# Patient Record
Sex: Male | Born: 1955 | Race: Black or African American | Hispanic: No | Marital: Single | Smoking: Current every day smoker
Health system: Southern US, Community
[De-identification: ages and names within clinical notes are randomized; demographics above are authoritative.]

## PROBLEM LIST (undated history)

## (undated) DIAGNOSIS — F419 Anxiety disorder, unspecified: Secondary | ICD-10-CM

## (undated) DIAGNOSIS — IMO0002 Reserved for concepts with insufficient information to code with codable children: Secondary | ICD-10-CM

## (undated) DIAGNOSIS — IMO0001 Reserved for inherently not codable concepts without codable children: Secondary | ICD-10-CM

## (undated) DIAGNOSIS — C61 Malignant neoplasm of prostate: Secondary | ICD-10-CM

## (undated) DIAGNOSIS — R232 Flushing: Secondary | ICD-10-CM

## (undated) DIAGNOSIS — E785 Hyperlipidemia, unspecified: Secondary | ICD-10-CM

## (undated) DIAGNOSIS — R972 Elevated prostate specific antigen [PSA]: Secondary | ICD-10-CM

## (undated) DIAGNOSIS — I10 Essential (primary) hypertension: Secondary | ICD-10-CM

## (undated) DIAGNOSIS — K5790 Diverticulosis of intestine, part unspecified, without perforation or abscess without bleeding: Secondary | ICD-10-CM

## (undated) DIAGNOSIS — C419 Malignant neoplasm of bone and articular cartilage, unspecified: Secondary | ICD-10-CM

## (undated) HISTORY — DX: Flushing: R23.2

## (undated) HISTORY — PX: TONSILLECTOMY: SUR1361

## (undated) HISTORY — DX: Reserved for concepts with insufficient information to code with codable children: IMO0002

## (undated) HISTORY — DX: Reserved for inherently not codable concepts without codable children: IMO0001

---

## 2010-03-12 DIAGNOSIS — C61 Malignant neoplasm of prostate: Secondary | ICD-10-CM

## 2010-03-12 HISTORY — DX: Malignant neoplasm of prostate: C61

## 2010-03-13 ENCOUNTER — Emergency Department (HOSPITAL_COMMUNITY)
Admission: EM | Admit: 2010-03-13 | Discharge: 2010-03-13 | Payer: Self-pay | Source: Home / Self Care | Admitting: Emergency Medicine

## 2010-04-06 ENCOUNTER — Observation Stay (HOSPITAL_COMMUNITY)
Admission: EM | Admit: 2010-04-06 | Discharge: 2010-04-07 | Payer: Self-pay | Source: Home / Self Care | Attending: Internal Medicine | Admitting: Internal Medicine

## 2010-04-06 LAB — DIFFERENTIAL
Basophils Absolute: 0.1 10*3/uL (ref 0.0–0.1)
Basophils Relative: 1 % (ref 0–1)
Eosinophils Absolute: 0.3 10*3/uL (ref 0.0–0.7)
Eosinophils Relative: 3 % (ref 0–5)
Monocytes Absolute: 0.9 10*3/uL (ref 0.1–1.0)

## 2010-04-06 LAB — CBC
HCT: 36.6 % — ABNORMAL LOW (ref 39.0–52.0)
MCHC: 32.8 g/dL (ref 30.0–36.0)
RDW: 13.9 % (ref 11.5–15.5)

## 2010-04-06 LAB — URINALYSIS, ROUTINE W REFLEX MICROSCOPIC
Leukocytes, UA: NEGATIVE
Urine Glucose, Fasting: NEGATIVE mg/dL
pH: 5.5 (ref 5.0–8.0)

## 2010-04-06 LAB — URINE MICROSCOPIC-ADD ON

## 2010-04-06 LAB — COMPREHENSIVE METABOLIC PANEL
ALT: 11 U/L (ref 0–53)
Calcium: 9.8 mg/dL (ref 8.4–10.5)
GFR calc Af Amer: >60 mL/min (ref >60)
Glucose, Bld: 107 mg/dL — ABNORMAL HIGH (ref 70–99)
Sodium: 139 mEq/L (ref 135–145)
Total Protein: 7.1 g/dL (ref 6.0–8.3)

## 2010-04-06 LAB — LIPASE, BLOOD: Lipase: 29 U/L (ref 11–59)

## 2010-04-07 LAB — URINE CULTURE
Colony Count: 25000
Culture  Setup Time: 201201261300

## 2010-04-07 LAB — PSA: PSA: 1432.58 ng/mL — ABNORMAL HIGH (ref <=4.00)

## 2010-04-07 LAB — CBC
HCT: 34.4 % — ABNORMAL LOW (ref 39.0–52.0)
Hemoglobin: 11.2 g/dL — ABNORMAL LOW (ref 13.0–17.0)
MCHC: 32.6 g/dL (ref 30.0–36.0)
WBC: 8.6 10*3/uL (ref 4.0–10.5)

## 2010-04-07 LAB — BASIC METABOLIC PANEL
CO2: 26 mEq/L (ref 19–32)
Glucose, Bld: 105 mg/dL — ABNORMAL HIGH (ref 70–99)
Potassium: 3.4 mEq/L — ABNORMAL LOW (ref 3.5–5.1)
Sodium: 140 mEq/L (ref 135–145)

## 2010-04-23 NOTE — Discharge Summary (Signed)
NAME:  Stephen Stokes, Stephen Stokes NO.:  [id number]  MEDICAL RECORD NO.:  [id number]          PATIENT TYPE:  OBV  LOCATION:  5531                         FACILITY:  [facility]  PHYSICIAN:  Cordelia Pen, NP  DATE OF BIRTH:  1955-08-18  DATE OF ADMISSION:  04/06/2010 DATE OF DISCHARGE:                              DISCHARGE SUMMARY   PRIMARY CARE PHYSICIAN:  None.  EXPECTED DATE OF DISPOSITION:  April 07, 2010  DISCHARGE DIAGNOSES: 1. Back pain in the setting of suspected metastatic prostate cancer. 2. Possible cystitis. 3. Hypokalemia, mild. 4. Anemia.  TESTS PERFORMED DURING HOSPITALIZATION:  CT of the abdomen and pelvis obtained April 06, 2010, showing retroperitoneal and pelvic lymphadenopathy with diffuse bony lesions suspicious for metastatic process.  PERTINENT LABORATORY:  White cell count 8.6, platelet count 142, hemoglobin 11.2, hematocrit 34.4, potassium 3.4.  Urine culture showing 25,000 colonies of multiple bacterial morphotypes, PSA is pending at time of dictation.  CONSULTATIONS OBTAINED DURING HOSPITALIZATION:  None.  BRIEF HISTORY:  [name] is a 55 year old African American male who presented to [facility] Emergency Department on day of admission with complaints of intractable back pain for several weeks.  CT of the abdomen and pelvis obtained at time of admission was concerning for metastatic process, being concerned that the patient will be lost for followup, he was not discharged from the emergency room.  The patient was admitted overnight for observation and pain control.  HOSPITAL COURSE: 1. Back pain in setting of suspected metastatic prostate cancer.     Again, the patient was admitted for observation status so that     followup care could be arranged.  The patient will need followup     with [facility] for possible cystoscopy with likely     subsequent referral to Oncology.  At this time, I am awaiting     callback from  [facility] to schedule outpatient appointment.     The patient is aware of need for close followup.  He verifies     understanding.  We will also discharge the patient on Lortab for     pain control.  The patient is instructed to use over-the-counter     stool softener for any constipation that can be a result of pain     medications.  PSA is pending at time of dictation. 2. Possible cystitis.  The patient did have an abnormal bladder     appearance on CT.  Uncertain, whether this is related to malignancy     versus a cystitis.  The patient did have some mild leukocytosis on     admission that is improved with empiric antibiotics.  We will     continue empiric Cipro for a total of 3 days treatment.  Urine     culture is unremarkable. 3. Anemia.  Suspect related to problem #1 to be monitored outpatient.  DISPOSITION:  The patient is felt medically stable for discharge home at this time.  Urology appointment 2/8 with Dr. Patsi Sears. See addendum for discharge medications.    Cordelia Pen, NP  Brendia Sacks, MD   LE/MEDQ  D:  04/07/2010  T:  04/07/2010  Job:  [id number]  Electronically Signed by Brendia Sacks  on 04/23/2010 03:52:58 PM

## 2010-04-23 NOTE — H&P (Signed)
NAME:  Stephen Stokes, Stephen Stokes NO.:  [id number]  MEDICAL RECORD NO.:  [id number]          PATIENT TYPE:  EMS  LOCATION:  MAJO                         FACILITY:  [facility]  PHYSICIAN:  Brendia Sacks, MD    DATE OF BIRTH:  1955-09-30  DATE OF ADMISSION:  04/06/2010 DATE OF DISCHARGE:                             HISTORY & PHYSICAL   PRIMARY CARE PHYSICIAN:  None.  The patient has no primary care physician.  REFERRING PHYSICIAN:  Carleene Cooper, MD  CHIEF COMPLAINT:  Back pain.  HISTORY OF PRESENT ILLNESS:  A 55 year old man presents to the Emergency Room with back pain.  He reports the pain has been going on for the last several weeks and seems to be getting worse.  He is aggravated by position and movement and relieved by lying down.  It can be up to a maximal intensity of 10/10.  He has noticed these low back pain, which is paravertebral in nature in the lumbar area has gone worse.  It is difficult for him to get up and down steps.  The pain was so severe last night, he came to the Emergency Room today.  He also had some pain in his side of the stomach as well as in the spine.  The patient has no primary care physician and has not seen physician in many years.  He takes no medications and has no known medical problems.  REVIEW OF SYSTEMS:  Negative for fever, weight loss, night sweats, chills, changes to his vision, sore throat, rash, muscle aches, and chest pain.  He did have shortness breath last night, but that resolved. No nausea, vomiting, abdominal pain, diarrhea, or bleeding.  He has had some dysuria.  PAST MEDICAL HISTORY:  None.  PAST SURGICAL HISTORY:  None.  SOCIAL HISTORY:  Smokes 4-5 cigarettes per day.  Drinks about 4 beers per week.  Denies drugs.  Works part-time as a Copy.  Lives by himself.  FAMILY HISTORY:  Negative for first-degree coronary artery disease.  ALLERGIES:  None.  MEDICATIONS:  He has recently been on Motrin for his back  pain.  PHYSICAL EXAMINATION:  VITAL SIGNS:  Blood pressure 167/98, pulse 96, respirations 18, temperature 98.3, and sat 97%. GENERAL:  Well-developed, well-nourished man in no acute distress. HEENT:  Head and appears to be normal.  Eyes, sclerae are clear.  Pupils are equal, round, and reactive to light.  Lids, lashes, and conjunctivae appear normal.  ENT:  Hearing is grossly normal.  Lips, gums, and tongue appear unremarkable.  Poor dentition with obvious caries. NECK:  Supple.  No lymphadenopathy or masses.  No thyromegaly. CHEST:  Clear to auscultation bilaterally.  No wheezes, rales, or rhonchi.  There is normal respiratory effort.  No lower extremity edema. ABDOMEN:  Soft, nontender and nondistended.  No masses are appreciated. SKIN:  Normal without rash or indurations.  Nontender to palpation. Tone and strength in the upper and lower extremities appears to be grossly normal. PSYCHIATRIC:  Grossly normal affect and mood.  Speech is fluent and appropriate.  He is a fair historian. BACK:  No gross abnormalities.  He has  no pain with palpation or left CVA tenderness, although he does locate his pain in the paravertebral lumbar area. GENITOURINARY:  The anus does appear to be normal.  He does have an external hemorrhoid.  Digital rectal exam is notable for nodular, firm, asymmetric prostate.  PERTINENT LABORATORY STUDIES: 1. CBC is notable for mild leukocytosis of 10.9 and hemoglobin of     12.0. 2. Basic metabolic panel notable is essentially unremarkable.  Hepatic     panel is notable for bilirubin of 1.5 and alkaline phosphatase of     789. 3. Lipase is negative. 4. Urinalysis is equivocal.  Urine cultures pending.  IMAGING DATA:  CT of the abdomen and pelvis on April 06, 2010: Retroperitoneal and pelvic lymphadenopathy and diffuse sclerotic bony lesions highly suspicious for metastatic neoplasm or lymphoma. Correlate with PSA and recommend further evaluation.  Question  mild bladder wall thickening and perivesicular inflammation.  The size of mass is not excluded.  Colonic diverticulosis without diverticulitis.  ASSESSMENT/PLAN:  This is a 55 year old man who presents to the Emergency Room with back pain.  1. Back pain.  Secondary to the diagnoses as listed below.  We will     admit for overnight observation both for pain control and     coordination of further evaluation and followup.  See orders. 2. Suspected metastatic prostate cancer.  Exam is abnormal.  Based on     his exam, as well as the CT findings and elevated alkaline     phosphatase, I suspect metastatic prostate cancer.  We will check     PSA.  The patient will certainly need further evaluation both from     urologic as well as potentially on Oncology standpoint.  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  We will therefore admit for overnight     observation and coordination of his care as well as pain control.     We will discuss this further evaluation with Urology and Oncology.     If the pain is stable, he may be able to be discharged in the     morning. 3. Abnormal bladder.  We will treat for cystitis, although his     urinalysis is unimpressive.  He may have a primary bladder     malignancy.  He will need further outpatient evaluation. 4. Normocytic anemia.  We will recheck his CBC in the morning.  If     elevated blood pressure, likely secondary to pain although he     certainly may have undiagnosed hypertension.  We will continue to     follow this.     Brendia Sacks, MD    DG/MEDQ  D:  04/06/2010  T:  04/06/2010  Job:  [id number]  Electronically Signed by Brendia Sacks  on 04/23/2010 03:53:05 PM

## 2010-05-17 ENCOUNTER — Other Ambulatory Visit (HOSPITAL_COMMUNITY): Payer: Self-pay | Admitting: Urology

## 2010-05-17 DIAGNOSIS — C61 Malignant neoplasm of prostate: Secondary | ICD-10-CM

## 2010-05-18 NOTE — Discharge Summary (Signed)
  NAME:  Stephen Stokes, Stephen Stokes NO.:  [id number]  MEDICAL RECORD NO.:  [id number]          PATIENT TYPE:  OBV  LOCATION:  5531                         FACILITY:  [facility]  PHYSICIAN:  Brendia Sacks, MD    DATE OF BIRTH:  [birth year]  DATE OF ADMISSION:  04/06/2010 DATE OF DISCHARGE:                              DISCHARGE SUMMARY   ADDENDUM  MEDICATIONS AT THE TIME OF DISCHARGE: 1. Cipro 750 mg p.o. b.i.d. x2 more days. 2. Hydrocodone/APAP 5/325 one-two tablets p.o. q.4 h p.r.n. pain.     Cordelia Pen, NP   ______________________________ Brendia Sacks, MD    LE/MEDQ  D:  04/07/2010  T:  04/07/2010  Job:  [id number]  Electronically Signed by Cordelia Pen NP on 04/28/2010 11:58:52 AM Electronically Signed by Brendia Sacks  on 05/17/2010 12:58:40 PM

## 2010-05-23 ENCOUNTER — Ambulatory Visit (HOSPITAL_COMMUNITY): Payer: Self-pay

## 2010-05-23 ENCOUNTER — Ambulatory Visit (HOSPITAL_COMMUNITY)
Admission: RE | Admit: 2010-05-23 | Discharge: 2010-05-23 | Disposition: A | Discharge status: Home / Self Care | Payer: Medicaid Other | Source: Ambulatory Visit | Attending: Urology | Admitting: Urology

## 2010-05-23 ENCOUNTER — Encounter (HOSPITAL_COMMUNITY)
Admission: RE | Admit: 2010-05-23 | Discharge: 2010-05-23 | Disposition: A | Discharge status: Home / Self Care | Payer: Medicaid Other | Source: Ambulatory Visit | Attending: Urology | Admitting: Urology

## 2010-05-23 DIAGNOSIS — C7952 Secondary malignant neoplasm of bone marrow: Secondary | ICD-10-CM | POA: Insufficient documentation

## 2010-05-23 DIAGNOSIS — N1330 Unspecified hydronephrosis: Secondary | ICD-10-CM | POA: Insufficient documentation

## 2010-05-23 DIAGNOSIS — M25559 Pain in unspecified hip: Secondary | ICD-10-CM | POA: Insufficient documentation

## 2010-05-23 DIAGNOSIS — C7951 Secondary malignant neoplasm of bone: Secondary | ICD-10-CM | POA: Insufficient documentation

## 2010-05-23 DIAGNOSIS — K5730 Diverticulosis of large intestine without perforation or abscess without bleeding: Secondary | ICD-10-CM | POA: Insufficient documentation

## 2010-05-23 DIAGNOSIS — I517 Cardiomegaly: Secondary | ICD-10-CM | POA: Insufficient documentation

## 2010-05-23 DIAGNOSIS — C61 Malignant neoplasm of prostate: Secondary | ICD-10-CM

## 2010-05-23 DIAGNOSIS — R599 Enlarged lymph nodes, unspecified: Secondary | ICD-10-CM | POA: Insufficient documentation

## 2010-05-23 MED ORDER — IOHEXOL 300 MG/ML  SOLN
100.0000 mL | Freq: Once | INTRAMUSCULAR | Status: AC | PRN
  Administered 2010-05-23: 100 mL via INTRAVENOUS

## 2010-05-23 MED ORDER — TECHNETIUM TC 99M MEDRONATE IV KIT
25.0000 | PACK | Freq: Once | INTRAVENOUS | Status: AC | PRN
  Administered 2010-05-23: 23.4 via INTRAVENOUS

## 2010-05-31 ENCOUNTER — Other Ambulatory Visit (HOSPITAL_COMMUNITY): Payer: Self-pay | Admitting: Urology

## 2010-06-07 ENCOUNTER — Ambulatory Visit (HOSPITAL_COMMUNITY)
Admission: RE | Admit: 2010-06-07 | Discharge: 2010-06-07 | Disposition: A | Discharge status: Home / Self Care | Payer: Medicaid Other | Source: Ambulatory Visit | Attending: Urology | Admitting: Urology

## 2010-06-07 DIAGNOSIS — Z13820 Encounter for screening for osteoporosis: Secondary | ICD-10-CM | POA: Insufficient documentation

## 2010-06-08 ENCOUNTER — Encounter (HOSPITAL_COMMUNITY): Payer: Medicaid Other

## 2010-06-08 ENCOUNTER — Ambulatory Visit (HOSPITAL_COMMUNITY)
Admission: RE | Admit: 2010-06-08 | Discharge: 2010-06-08 | Disposition: A | Discharge status: Home / Self Care | Payer: Medicaid Other | Source: Ambulatory Visit | Attending: Urology | Admitting: Urology

## 2010-06-08 ENCOUNTER — Other Ambulatory Visit: Payer: Self-pay | Admitting: Urology

## 2010-06-08 ENCOUNTER — Other Ambulatory Visit (HOSPITAL_COMMUNITY): Payer: Self-pay | Admitting: Urology

## 2010-06-08 DIAGNOSIS — C61 Malignant neoplasm of prostate: Secondary | ICD-10-CM | POA: Insufficient documentation

## 2010-06-08 DIAGNOSIS — Z01810 Encounter for preprocedural cardiovascular examination: Secondary | ICD-10-CM | POA: Insufficient documentation

## 2010-06-08 DIAGNOSIS — Z01818 Encounter for other preprocedural examination: Secondary | ICD-10-CM | POA: Insufficient documentation

## 2010-06-08 DIAGNOSIS — C801 Malignant (primary) neoplasm, unspecified: Secondary | ICD-10-CM

## 2010-06-08 DIAGNOSIS — I517 Cardiomegaly: Secondary | ICD-10-CM | POA: Insufficient documentation

## 2010-06-08 DIAGNOSIS — C7951 Secondary malignant neoplasm of bone: Secondary | ICD-10-CM | POA: Insufficient documentation

## 2010-06-08 DIAGNOSIS — C7952 Secondary malignant neoplasm of bone marrow: Secondary | ICD-10-CM | POA: Insufficient documentation

## 2010-06-08 DIAGNOSIS — Z01812 Encounter for preprocedural laboratory examination: Secondary | ICD-10-CM | POA: Insufficient documentation

## 2010-06-08 LAB — DIFFERENTIAL
Basophils Absolute: 0 10*3/uL (ref 0.0–0.1)
Basophils Relative: 1 % (ref 0–1)
Neutro Abs: 3.8 10*3/uL (ref 1.7–7.7)
Neutrophils Relative %: 57 % (ref 43–77)

## 2010-06-08 LAB — BASIC METABOLIC PANEL
BUN: 16 mg/dL (ref 6–23)
Calcium: 9 mg/dL (ref 8.4–10.5)
GFR calc non Af Amer: >60 mL/min (ref >60)
Potassium: 3.5 mEq/L (ref 3.5–5.1)
Sodium: 144 mEq/L (ref 135–145)

## 2010-06-08 LAB — CBC
MCHC: 31.2 g/dL (ref 30.0–36.0)
RDW: 17.8 % — ABNORMAL HIGH (ref 11.5–15.5)

## 2010-06-08 LAB — SURGICAL PCR SCREEN: Staphylococcus aureus: NEGATIVE

## 2010-06-11 HISTORY — PX: ORCHIECTOMY: SHX2116

## 2010-06-11 HISTORY — PX: TRANSURETHRAL RESECTION OF PROSTATE: SHX73

## 2010-06-12 ENCOUNTER — Ambulatory Visit (HOSPITAL_COMMUNITY)
Admission: RE | Admit: 2010-06-12 | Discharge: 2010-06-14 | DRG: 711 | Disposition: A | Discharge status: Home / Self Care | Payer: Medicaid Other | Source: Ambulatory Visit | Attending: Urology | Admitting: Urology

## 2010-06-12 ENCOUNTER — Other Ambulatory Visit: Payer: Self-pay | Admitting: Urology

## 2010-06-12 DIAGNOSIS — Z01812 Encounter for preprocedural laboratory examination: Secondary | ICD-10-CM

## 2010-06-12 DIAGNOSIS — C775 Secondary and unspecified malignant neoplasm of intrapelvic lymph nodes: Secondary | ICD-10-CM | POA: Diagnosis present

## 2010-06-12 DIAGNOSIS — Y836 Removal of other organ (partial) (total) as the cause of abnormal reaction of the patient, or of later complication, without mention of misadventure at the time of the procedure: Secondary | ICD-10-CM | POA: Diagnosis not present

## 2010-06-12 DIAGNOSIS — Y9210 Unspecified residential institution as the place of occurrence of the external cause: Secondary | ICD-10-CM | POA: Diagnosis not present

## 2010-06-12 DIAGNOSIS — N320 Bladder-neck obstruction: Secondary | ICD-10-CM | POA: Diagnosis present

## 2010-06-12 DIAGNOSIS — IMO0002 Reserved for concepts with insufficient information to code with codable children: Secondary | ICD-10-CM | POA: Diagnosis not present

## 2010-06-12 DIAGNOSIS — D649 Anemia, unspecified: Secondary | ICD-10-CM | POA: Diagnosis present

## 2010-06-12 DIAGNOSIS — Z79899 Other long term (current) drug therapy: Secondary | ICD-10-CM

## 2010-06-12 DIAGNOSIS — Z01818 Encounter for other preprocedural examination: Secondary | ICD-10-CM

## 2010-06-12 DIAGNOSIS — C61 Malignant neoplasm of prostate: Secondary | ICD-10-CM | POA: Diagnosis present

## 2010-06-12 DIAGNOSIS — F17200 Nicotine dependence, unspecified, uncomplicated: Secondary | ICD-10-CM | POA: Diagnosis present

## 2010-06-12 DIAGNOSIS — I10 Essential (primary) hypertension: Secondary | ICD-10-CM | POA: Diagnosis present

## 2010-06-12 DIAGNOSIS — C7952 Secondary malignant neoplasm of bone marrow: Secondary | ICD-10-CM | POA: Diagnosis present

## 2010-06-12 DIAGNOSIS — C7951 Secondary malignant neoplasm of bone: Secondary | ICD-10-CM | POA: Diagnosis present

## 2010-06-13 LAB — HEMOGLOBIN AND HEMATOCRIT, BLOOD
HCT: 21.9 % — ABNORMAL LOW (ref 39.0–52.0)
HCT: 26.7 % — ABNORMAL LOW (ref 39.0–52.0)
Hemoglobin: 6.6 g/dL — CL (ref 13.0–17.0)

## 2010-06-13 LAB — PREPARE RBC (CROSSMATCH)

## 2010-06-14 LAB — TYPE AND SCREEN
ABO/RH(D): A POS
Antibody Screen: NEGATIVE
Unit division: 0

## 2010-06-23 NOTE — H&P (Signed)
  NAME:  Stephen Stokes, Stephen Stokes               ACCOUNT NO.:  [id number]  MEDICAL RECORD NO.:  [id number]           PATIENT TYPE:  I  LOCATION:  0012                         FACILITY:  [id number]  PHYSICIAN:  Flynt Breeze I. Patsi Sears, M.D.DATE OF BIRTH:  [birth year]  DATE OF ADMISSION:  06/12/2010 DATE OF DISCHARGE:                             HISTORY & PHYSICAL   Stephen Stokes is a 55 year old single male, seen in [facility] Emergency Room with left flank pain, and rectal examination showing bilateral hard prostate.  Prostate biopsy was accomplished, and showed a 45 g prostate, with Gleason 7, Gleason 8, Gleason 9 prostate cancer in 80-95% of each of 12 biopsies.  He has unremitting thoracic back pain, and Percocet was changed for MS Contin and MSIR.  The patient has PSA of 1237.67.  He took Casodex 50 mg 1 p.o. per day prior to surgical intervention.  His past medical history is otherwise noncontributory.  Tobacco is none. Alcohol none.  MEDICATIONS: 1. Casodex 15 mg 1 p.o. per day. 2. Hypertensive medication (unknown). 3. Hydrocodone acetaminophen for pain - switched to MSIR and MS     Contin.  FAMILY HISTORY:  Father died age 36 of boating accident.  Mother died (unknown age).  SOCIAL HISTORY:  The patient drinks approximately 1 alcohol drink per day and 1 caffeine drink per day.  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  Tobacco is 1 pack per day for 25 years.  PHYSICAL EXAM:  GENERAL:  A well-developed, well-nourished [place] American male, in no acute distress. VITAL SIGNS:  Blood pressure 170/110, temperature 98.6, heart rate is 97. NECK:  Supple, nontender. CHEST:  Clear to P and A. ABDOMEN:  Soft.  Positive bowel sounds without organomegaly or masses. GENITOURINARY:  Shows normal male external genitalia.  Rectal examination shows 3+ rock hard prostate, nonnodular, nonmobile. RECTAL:  Otherwise shows normal sphincter tone. EXTREMITIES:  No cyanosis and no  edema.  ADMITTING IMPRESSION:  Metastatic adenocarcinoma of prostate.  PLAN:  Cystourethroscopy, Gyrus TURP, and bilateral scrotal orchiectomy.     Sehaj Mcenroe I. Patsi Sears, M.D.     SIT/MEDQ  D:  06/12/2010  T:  06/12/2010  Job:  [id number]  Electronically Signed by Jethro Bolus M.D. on 06/23/2010 05:49:39 PM

## 2010-06-23 NOTE — Discharge Summary (Signed)
NAME:  Stephen Stokes, Stephen Stokes               ACCOUNT NO.:  [id number]  MEDICAL RECORD NO.:  [id number]           PATIENT TYPE:  I  LOCATION:  1435                         FACILITY:  [facility]  PHYSICIAN:  Kore Madlock I. Patsi Sears, M.D.DATE OF BIRTH:  11/17/1955  DATE OF ADMISSION:  06/12/2010 DATE OF DISCHARGE:  06/14/2010                              DISCHARGE SUMMARY   FINAL DIAGNOSIS:  Metastatic adenocarcinoma of the prostate.  OPERATIONS:  This admission took place on April 02, 1. TURP and bilateral scrotal orchiectomy. 2. Cystoscopy, clot evacuation, cauterization of the prostate.  HISTORY:  Stephen Stokes is a 55 year old single male, originally seen at [facility] Emergency Room with left flank pain, with rectal examination showing bilateral heart prostate.  Biopsy showed Gleason 7, Gleason 8, and Gleason 9 prostate cancer and all biopsies, and an 80% to 95% of each biopsy.  He has had unremitting back pain, treated now with MSContin and MSIR.  His PSA was 1237.67.  The patient was pretreated with Casodex 50 mg 1 p.o. per day prior to surgical intervention.  He has a positive bone scan and positive CT scan for metastatic disease.  PAST MEDICAL HISTORY:  His past history has been otherwise noncontributory.  Tobacco is none currently.  MEDICATIONS: 1. Casodex 50 mg one per day. 2. Hypertension medication (unknown). 3. MSIR. 4. MS Contin.  FAMILY HISTORY:  Father died at age 53.  Mother died, unknown age.  SOCIAL HISTORY:  Minimal alcohol, minimal caffeine per day.  Tobacco, past history of 25 pack-year history.  He is currently single and employed as a Copy.  ADMISSION PHYSICAL EXAMINATION:  As noted in H and P of April 2nd.  LABORATORY DATA:  On admission, hemoglobin is 12.0 with white blood cell count 10,900, platelet count 150,000.  Hemoglobin on March 29th was 8.9, but drop to 6.6 postoperatively, rose to 8.4 after transfusion.  IMAGING:  CT scan result shows mild right  hydronephrosis, with delayed excretion of contrast material from the right kidney.  Likely cause for obstruction is invasion of the bladder base by the prostate gland at the ureterovesical junction.  There is retroperitoneal lymphadenopathy noted.  Enlarged left and right external iliac nodes were noted.  Bone scan shows diffuse osseous metastatic disease, involving the ribs, spine, pelvis.  HOSPITAL COURSE:  On the day of admission, the patient underwent TURP, and bilateral scrotal orchiectomy.  Postoperatively, the patient developed hematuria with clot formation, which could not be irrigated on the floor.  The patient was taken back to the operating room for cystoscopy and clot evacuation, fulguration of prostatic fossa.  He did well postoperatively, and had transfusion, and following this, the Foley catheter was removed, the patient is allowed to be discharged.  He has a followup date in the office.  His MS Contin is changed back to Percocet, because of decreased pain in his back.  (? orchiectomy effect).  The patient will be followed in the office, and then eventually referred to Dr. Clelia Croft for followup.  He is discharged in improved condition.     Stephen Stokes I. Patsi Sears, M.D.     SIT/MEDQ  D:  06/14/2010  T:  06/15/2010  Job:  [id number]  Electronically Signed by Jethro Bolus M.D. on 06/23/2010 05:49:33 PM

## 2010-06-23 NOTE — Op Note (Signed)
NAME:  Stephen Stokes, BASU               ACCOUNT NO.:  [id number]  MEDICAL RECORD NO.:  [id number]           PATIENT TYPE:  I  LOCATION:  0012                         FACILITY:  [facility]  PHYSICIAN:  Lathyn Griggs I. Amoy Steeves, M.D.DATE OF BIRTH:  [birth year]  DATE OF PROCEDURE:  06/12/2010 DATE OF DISCHARGE:                              OPERATIVE REPORT   PREOPERATIVE DIAGNOSIS:  Adenocarcinoma of the prostate with obstruction, metastatic disease.  POSTOPERATIVE DIAGNOSIS:  Adenocarcinoma of the prostate with obstruction, metastatic disease.  OPERATIONS:  Cystourethroscopy, Gyrus transurethral resection of prostate, bilateral scrotal orchiectomy.  SURGEON:  Tino Ronan I. Patsi Sears, M.D.  ANESTHESIA:  General LMA.  PREPARATION:  After appropriate preanesthesia, the patient was brought to the operating room, placed on the operating room table in a dorsal supine position where general LMA anesthesia was induced.  He was then replaced in the dorsal lithotomy position where the pubis was prepped with Betadine solution and draped in usual fashion.  REVIEW OF HISTORY:  Mr. Stephen Stokes is a 22 year old single male, presenting to [facility] Emergency Room with left flank pain and one episode of rectal bleeding which was self limited.  The patient was found to have a normal hard prostate on rectal examination and a PSA of 1237.67. Prostate was 45 g and biopsy showed Gleason 7, Gleason 8, and Gleason 9 prostate cancer in 80% to 95% of each 12 biopsies.  He had unremitting thoracic back pain with bone scan showing multiple areas positive for metastatic disease and CT scan showing massive metastatic soft tissue disease in the lymph nodes.  The patient was started on hydromorphone, morphine sulfate, and  because she has significant outlet obstruction with apparent cancer growing up under the trigone, he is now for TURP and bilateral scrotal orchiectomy.  DESCRIPTION OF PROCEDURE:  Cystourethroscopy was  accomplished and shows severe obstruction of the bladder by thickened whitish prostate, presumed cancer of the prostate.  This process is throughout the prostate and also throughout the bladder neck and the bladder base up the level of the trigone.  I could not identify the orifices, however.  Resection was accomplished then from the 7 o'clock to the 5 o'clock position and from the 10 o'clock to the 7 o'clock position and from 2 o'clock to the 5 o'clock position..  Subtotal prostatectomy was accomplished.  Because it is felt that the patient needs to retain his urinary continence as well as possible and to try to open him up as well as possible.  However, I believe the cancer has grown underneath the bladder base and into the trigone area and will possibly cause ureteral obstruction.  Bleeding was stopped with electrosurgical unit, and following this, a 24- [place] three-way Foley catheter was placed to traction but no irrigation was necessary.  Following this, the scrotum was reprepped with Betadine solution and draped in usual fashion.  A midline scrotal incision was made.  Subcutaneous tissue dissected with electrosurgical unit.  Following this, the left hemiscrotum was entered with electrosurgical unit and the testicle identified.  The vas was isolated, dissected, ligated and incised.  It was ligated with a 0 Vicryl suture.  Following that, the remaining portion of the spermatic cord was divided, doubly clamped, and amputated.  Ligation was accomplished with 0 Vicryl suture.  No bleeding was noted.  The right hemiscrotum was entered and subcutaneous tissue dissected. The tunica vaginalis was entered and the vas was identified and dissected and ligated with 0 Vicryl suture.  Following this, the spermatic cord was then dissected, split in half and doubly clamped. The testicle was amputated and each end was ligated with 0 Vicryl suture.  Marcaine 0.25 plain was injected in the  stump of the spermatic cord bilaterally and into the wound.  The wound was closed with a running 3-0 Vicryl suture in each of the hemiscrotal dissections and in the subcutaneous tissue.  The skin was closed with running 4-0 Vicryl.  Following this, the patient was awakened and taken to recovery room in good condition.     Lilliane Sposito I. Patsi Sears, M.D.     SIT/MEDQ  D:  06/12/2010  T:  06/12/2010  Job:  [id number]  cc:   Melvern Banker Fax: [phone]  Electronically Signed by Stephen Stokes M.D. on 06/23/2010 05:49:36 PM

## 2010-06-29 NOTE — Op Note (Signed)
  NAME:  Stephen Stokes, Stephen Stokes               ACCOUNT NO.:  [id number]  MEDICAL RECORD NO.:  [id number]           PATIENT TYPE:  I  LOCATION:  1435                         FACILITY[facility]  [facility]  PHYSICIAN:  Heloise Purpura, MD      DATE OF BIRTH:  1956/01/15  DATE OF PROCEDURE:  06/12/2010 DATE OF DISCHARGE:                              OPERATIVE REPORT   PREOPERATIVE DIAGNOSES: 1. Hematuria 2. Clot retention.  POSTOPERATIVE DIAGNOSES: 1. Hematuria 2. Clot retention.  PROCEDURES: 1. Cystoscopy. 2. Clot evacuation. 3. Fulguration of prostatic bleeding site.  SURGEON:  Heloise Purpura, MD  ANESTHESIA:  General.  COMPLICATIONS:  None.  ESTIMATED BLOOD LOSS:  Minimal.  INDICATION:  [name] is a 55 year old gentleman who is status post a transurethral resection of the prostate earlier today by Dr. Jethro Bolus.  I was called to his bedside due to the fact he had no urine output since arriving to the floor and his catheter was unable to be irrigated by the nursing staff.  Multiple attempts were made to irrigate the patient's catheter along with placement of a 24-French hematuria catheter.  He continued to have significant obstruction of his catheter and it was recommended that he proceed to the operating room for urgent cystoscopy and clot evacuation.  The potential risks, complications, and alternative options were discussed with the patient and informed consent was obtained.  DESCRIPTION OF PROCEDURE:  The patient was taken to the operating room and a general anesthetic was administered.  He was given preoperative antibiotics, placed in the dorsal lithotomy position, and prepped and draped in the usual sterile fashion.  Next, a preoperative time-out was performed.  Cystourethroscopy was performed after placement of a 26- French resectoscope sheath.  This revealed multiple large formed clot within the bladder which was evacuated with a Toomey syringe.  Once this clot was removed,  cystoscopy was again performed with no further clot seen within the bladder.  There was noted to be a prostatic bleeding site near the bladder neck which was able to be cauterized with a cautery loop.  This appeared to adequately control the bleeding and a 24- [place] 3-way hematuria catheter was then placed with the balloon inflated with 30 cc of sterile water.  The patient was then placed on traction with his catheter and normal saline continuous bladder irrigation was instituted.  He tolerated the procedure well and without complications.  He was able to be extubated and transferred to the recovery unit in satisfactory condition.     Heloise Purpura, MD     LB/MEDQ  D:  06/12/2010  T:  06/13/2010  Job:  [id number]  cc:   Lynelle Smoke I. Patsi Sears, M.D. Fax: [phone]  Electronically Signed by Heloise Purpura MD on 06/29/2010 06:03:44 AM

## 2011-06-05 ENCOUNTER — Other Ambulatory Visit: Payer: Self-pay | Admitting: Family Medicine

## 2011-06-05 ENCOUNTER — Ambulatory Visit
Admission: RE | Admit: 2011-06-05 | Discharge: 2011-06-05 | Disposition: A | Discharge status: Home / Self Care | Payer: Medicaid Other | Source: Ambulatory Visit | Attending: Family Medicine | Admitting: Family Medicine

## 2011-06-05 DIAGNOSIS — I10 Essential (primary) hypertension: Secondary | ICD-10-CM

## 2011-06-14 ENCOUNTER — Ambulatory Visit (HOSPITAL_COMMUNITY)
Admission: RE | Admit: 2011-06-14 | Discharge: 2011-06-14 | Disposition: A | Discharge status: Home / Self Care | Payer: Medicaid Other | Source: Ambulatory Visit | Attending: Family Medicine | Admitting: Family Medicine

## 2011-06-14 ENCOUNTER — Other Ambulatory Visit: Payer: Self-pay

## 2011-06-14 DIAGNOSIS — R000 Tachycardia, unspecified: Secondary | ICD-10-CM | POA: Insufficient documentation

## 2011-06-14 LAB — LIPID PANEL
Cholesterol: 269 mg/dL — ABNORMAL HIGH (ref 0–200)
HDL: 42 mg/dL (ref >39)
Total CHOL/HDL Ratio: 6.4 RATIO
Triglycerides: 132 mg/dL (ref <150)
VLDL: 26 mg/dL (ref 0–40)

## 2011-06-14 LAB — GLUCOSE, RANDOM: Glucose, Bld: 124 mg/dL — ABNORMAL HIGH (ref 70–99)

## 2011-12-16 ENCOUNTER — Encounter (HOSPITAL_COMMUNITY): Payer: Self-pay | Admitting: *Deleted

## 2011-12-16 ENCOUNTER — Emergency Department (HOSPITAL_COMMUNITY): Payer: Self-pay

## 2011-12-16 ENCOUNTER — Emergency Department (HOSPITAL_COMMUNITY)
Admission: EM | Admit: 2011-12-16 | Discharge: 2011-12-16 | Disposition: A | Discharge status: Home / Self Care | Payer: Self-pay | Attending: Emergency Medicine | Admitting: Emergency Medicine

## 2011-12-16 DIAGNOSIS — Z859 Personal history of malignant neoplasm, unspecified: Secondary | ICD-10-CM | POA: Insufficient documentation

## 2011-12-16 DIAGNOSIS — C61 Malignant neoplasm of prostate: Secondary | ICD-10-CM

## 2011-12-16 DIAGNOSIS — M79609 Pain in unspecified limb: Secondary | ICD-10-CM | POA: Insufficient documentation

## 2011-12-16 DIAGNOSIS — M5416 Radiculopathy, lumbar region: Secondary | ICD-10-CM

## 2011-12-16 DIAGNOSIS — I10 Essential (primary) hypertension: Secondary | ICD-10-CM | POA: Insufficient documentation

## 2011-12-16 DIAGNOSIS — M549 Dorsalgia, unspecified: Secondary | ICD-10-CM | POA: Insufficient documentation

## 2011-12-16 HISTORY — DX: Essential (primary) hypertension: I10

## 2011-12-16 MED ORDER — PREDNISONE 20 MG PO TABS: ORAL_TABLET | ORAL | Status: DC

## 2011-12-16 MED ORDER — OXYCODONE-ACETAMINOPHEN 5-325 MG PO TABS: 2.0000 | ORAL_TABLET | Freq: Four times a day (QID) | ORAL | Status: DC | PRN

## 2011-12-16 NOTE — ED Provider Notes (Signed)
History  This chart was scribed for Hurman Horn, MD by Erskine Emery. This patient was seen in room [facility] and the patient's care was started at 12:00.   CSN: [id number]  Arrival date & time 12/16/11  1028   None     Chief Complaint  Patient presents with  . Back Pain  . Leg Pain    (Consider location/radiation/quality/duration/timing/severity/associated sxs/prior treatment) The history is provided by the patient. No language interpreter was used.  Stephen Stokes is a 56 y.o. male who presents to the Emergency Department complaining of constant pain in the mid back that radiates down the right leg all the way down to the foot for the past couple days. Pt reports the pain is worse in the morning when he is stiff and at that time it is aggravated by movement. Pt reports some associated weakness and numbness in the right leg and mild SOB but denies any associated bowel or bladder incontinence, rash, fever, abdominal pain, chest pain, or h/o IV drug abuse. Pt has a h/o prostate and bone cancer with associated chronic pain. Pt was taking shots x1/month for the pain but ceased doing so 2 months ago. Pt reports it has been about a year since he had back pain. Pt started taking pain pills (Ibuprofen) yesterday but has not been taking anything else for pain for the last several months.  Pt is followed by a urologist for his prostate cancer. Dr. Bruna Potter is the pt's PCP.  Past Medical History  Diagnosis Date  . Cancer   . Hypertension     History reviewed. No pertinent past surgical history.  History reviewed. No pertinent family history.  History  Substance Use Topics  . Smoking status: Not on file  . Smokeless tobacco: Not on file  . Alcohol Use:       Review of Systems 10 Systems reviewed and are negative for acute change except as noted in the HPI.   Allergies  Review of patient's allergies indicates no known allergies.  Home Medications   Current Outpatient Rx  Name  Route Sig Dispense Refill  . ACETAMINOPHEN 500 MG PO TABS Oral Take 1,000 mg by mouth every 2 (two) hours as needed. pain    . OXYCODONE-ACETAMINOPHEN 5-325 MG PO TABS Oral Take 2 tablets by mouth every 6 (six) hours as needed for pain. 20 tablet 0  . PREDNISONE 20 MG PO TABS  3 tabs po day one, then 2 tabs daily x 4 days 11 tablet 0    Triage Vitals: BP 177/109  Pulse 110  Temp 98.7 F (37.1 C) (Oral)  Resp 16  SpO2 95%  Physical Exam  Nursing note and vitals reviewed. Constitutional:       Awake, alert, nontoxic appearance with baseline speech.  HENT:  Head: Atraumatic.  Eyes: Pupils are equal, round, and reactive to light. Right eye exhibits no discharge. Left eye exhibits no discharge.  Neck: Neck supple.  Cardiovascular: Normal rate, regular rhythm and normal heart sounds.   No murmur heard. Pulmonary/Chest: Effort normal and breath sounds normal. No respiratory distress. He has no wheezes. He has no rales. He exhibits no tenderness.  Abdominal: Soft. Bowel sounds are normal. He exhibits no mass. There is no tenderness. There is no rebound.  Musculoskeletal:       Thoracic back: He exhibits no tenderness.       Lumbar back: He exhibits no tenderness.       Diffuse lumbar and paralumbar tenderness. Right lateral  thigh and right lateral lower leg have decreased light touch. Feet have normal light touch. Pt walks with pain.  Bilateral lower extremities non tender without new rashes or color change, baseline ROM with intact DP pulses, CR<2 secs all digits bilaterally, DTR's symmetric and intact bilaterally KJ / AJ, motor symmetric bilateral 5 / 5 hip flexion, quadriceps, hamstrings, EHL, foot dorsiflexion, foot plantarflexion, gait somewhat antalgic but without apparent new ataxia.  Neurological:       Mental status baseline for patient.  Upper extremity motor strength and sensation intact and symmetric bilaterally.  Skin: No rash noted.  Psychiatric: He has a normal mood and  affect.    ED Course  Procedures (including critical care time) DIAGNOSTIC STUDIES: Oxygen Saturation is 95% on room air, adequate by my interpretation.    COORDINATION OF CARE: 12:05--Patient / Family / Caregiver informed of clinical course, understand medical decision-making process, and agree with plan.  13:24--I rechecked the pt and explained the results of his radiology.   Dg Lumbar Spine Complete  12/16/2011  *RADIOLOGY REPORT*  Clinical Data: Right thigh numbness with low back pain.  Prostate cancer.  LUMBAR SPINE - COMPLETE 4+ VIEW  Comparison: Whole body bone scan 05/23/2010.  Abdominal CT 05/23/2010.  Findings: There are five lumbar type vertebral bodies.  The alignment is normal.  There are diffuse blastic metastases throughout the lumbar spine and pelvis.  No pathologic fracture is identified.  There is no evidence of pars defect.  Facet degenerative changes are present inferiorly.  IMPRESSION: Diffuse osseous metastatic disease appears unchanged from available prior studies.  No acute fracture or malalignment identified.   Original Report Authenticated By: Gerrianne Scale, M.D.        MDM  I personally performed the services described in this documentation, which was scribed in my presence. The recorded information has been reviewed and considered. I doubt any other EMC precluding discharge at this time including, but not necessarily limited to the following:cauda equina.   Hurman Horn, MD 12/17/11 2234

## 2011-12-16 NOTE — ED Notes (Addendum)
Pt in c/o mid back and right leg pain, pt states he has a history of same and has been seen here for same, pt states he has had an insurance change and has been unable to get the medication he was taking for this. Pt with history of bone cancer.

## 2012-01-14 ENCOUNTER — Inpatient Hospital Stay (HOSPITAL_COMMUNITY)
Admission: EM | Admit: 2012-01-14 | Discharge: 2012-01-16 | DRG: 543 | Disposition: A | Discharge status: Home / Self Care | Payer: Medicaid Other | Attending: Internal Medicine | Admitting: Internal Medicine

## 2012-01-14 ENCOUNTER — Emergency Department (HOSPITAL_COMMUNITY): Payer: Medicaid Other

## 2012-01-14 ENCOUNTER — Encounter (HOSPITAL_COMMUNITY): Payer: Self-pay | Admitting: *Deleted

## 2012-01-14 ENCOUNTER — Inpatient Hospital Stay (HOSPITAL_COMMUNITY): Payer: Medicaid Other

## 2012-01-14 DIAGNOSIS — G959 Disease of spinal cord, unspecified: Secondary | ICD-10-CM | POA: Diagnosis present

## 2012-01-14 DIAGNOSIS — E669 Obesity, unspecified: Secondary | ICD-10-CM | POA: Diagnosis present

## 2012-01-14 DIAGNOSIS — D72829 Elevated white blood cell count, unspecified: Secondary | ICD-10-CM

## 2012-01-14 DIAGNOSIS — M549 Dorsalgia, unspecified: Secondary | ICD-10-CM

## 2012-01-14 DIAGNOSIS — R0789 Other chest pain: Secondary | ICD-10-CM

## 2012-01-14 DIAGNOSIS — I498 Other specified cardiac arrhythmias: Secondary | ICD-10-CM | POA: Diagnosis present

## 2012-01-14 DIAGNOSIS — R310 Gross hematuria: Secondary | ICD-10-CM

## 2012-01-14 DIAGNOSIS — G893 Neoplasm related pain (acute) (chronic): Secondary | ICD-10-CM | POA: Diagnosis present

## 2012-01-14 DIAGNOSIS — M898X9 Other specified disorders of bone, unspecified site: Secondary | ICD-10-CM | POA: Diagnosis present

## 2012-01-14 DIAGNOSIS — C7951 Secondary malignant neoplasm of bone: Principal | ICD-10-CM | POA: Diagnosis present

## 2012-01-14 DIAGNOSIS — Z9079 Acquired absence of other genital organ(s): Secondary | ICD-10-CM

## 2012-01-14 DIAGNOSIS — R319 Hematuria, unspecified: Secondary | ICD-10-CM

## 2012-01-14 DIAGNOSIS — Z9221 Personal history of antineoplastic chemotherapy: Secondary | ICD-10-CM

## 2012-01-14 DIAGNOSIS — M8448XA Pathological fracture, other site, initial encounter for fracture: Secondary | ICD-10-CM | POA: Diagnosis present

## 2012-01-14 DIAGNOSIS — N183 Chronic kidney disease, stage 3 unspecified: Secondary | ICD-10-CM | POA: Diagnosis not present

## 2012-01-14 DIAGNOSIS — E785 Hyperlipidemia, unspecified: Secondary | ICD-10-CM | POA: Diagnosis present

## 2012-01-14 DIAGNOSIS — K5730 Diverticulosis of large intestine without perforation or abscess without bleeding: Secondary | ICD-10-CM | POA: Diagnosis present

## 2012-01-14 DIAGNOSIS — C61 Malignant neoplasm of prostate: Secondary | ICD-10-CM

## 2012-01-14 DIAGNOSIS — F17200 Nicotine dependence, unspecified, uncomplicated: Secondary | ICD-10-CM | POA: Diagnosis present

## 2012-01-14 DIAGNOSIS — I10 Essential (primary) hypertension: Secondary | ICD-10-CM

## 2012-01-14 DIAGNOSIS — N1330 Unspecified hydronephrosis: Secondary | ICD-10-CM

## 2012-01-14 DIAGNOSIS — D649 Anemia, unspecified: Secondary | ICD-10-CM | POA: Diagnosis present

## 2012-01-14 DIAGNOSIS — K5790 Diverticulosis of intestine, part unspecified, without perforation or abscess without bleeding: Secondary | ICD-10-CM | POA: Diagnosis not present

## 2012-01-14 DIAGNOSIS — Z720 Tobacco use: Secondary | ICD-10-CM | POA: Diagnosis present

## 2012-01-14 HISTORY — DX: Malignant neoplasm of prostate: C61

## 2012-01-14 HISTORY — DX: Diverticulosis of intestine, part unspecified, without perforation or abscess without bleeding: K57.90

## 2012-01-14 LAB — URINALYSIS, ROUTINE W REFLEX MICROSCOPIC
Nitrite: POSITIVE — AB
Specific Gravity, Urine: >1.03 — ABNORMAL HIGH (ref 1.005–1.030)
Urobilinogen, UA: 1 mg/dL (ref 0.0–1.0)

## 2012-01-14 LAB — CBC WITH DIFFERENTIAL/PLATELET
Basophils Absolute: 0 10*3/uL (ref 0.0–0.1)
Basophils Relative: 0 % (ref 0–1)
Eosinophils Absolute: 0.3 10*3/uL (ref 0.0–0.7)
MCH: 29.6 pg (ref 26.0–34.0)
MCHC: 32.7 g/dL (ref 30.0–36.0)
Monocytes Relative: 7 % (ref 3–12)
Neutrophils Relative %: 62 % (ref 43–77)
Platelets: 335 10*3/uL (ref 150–400)
RDW: 13.9 % (ref 11.5–15.5)

## 2012-01-14 LAB — CK TOTAL AND CKMB (NOT AT ARMC): Total CK: 51 U/L (ref 7–232)

## 2012-01-14 LAB — POCT I-STAT TROPONIN I: Troponin i, poc: 0.01 ng/mL (ref 0.00–0.08)

## 2012-01-14 LAB — CBC
MCH: 29.3 pg (ref 26.0–34.0)
MCHC: 32.2 g/dL (ref 30.0–36.0)
Platelets: 311 10*3/uL (ref 150–400)
RBC: 3.96 MIL/uL — ABNORMAL LOW (ref 4.22–5.81)

## 2012-01-14 LAB — PROTIME-INR: Prothrombin Time: 13.7 seconds (ref 11.6–15.2)

## 2012-01-14 LAB — COMPREHENSIVE METABOLIC PANEL
Albumin: 3.9 g/dL (ref 3.5–5.2)
Alkaline Phosphatase: 181 U/L — ABNORMAL HIGH (ref 39–117)
BUN: 17 mg/dL (ref 6–23)
Potassium: 3.8 mEq/L (ref 3.5–5.1)
Sodium: 139 mEq/L (ref 135–145)
Total Protein: 8.1 g/dL (ref 6.0–8.3)

## 2012-01-14 LAB — HEMOGLOBIN A1C: Mean Plasma Glucose: 111 mg/dL (ref <117)

## 2012-01-14 LAB — LIPID PANEL
LDL Cholesterol: 190 mg/dL — ABNORMAL HIGH (ref 0–99)
VLDL: 32 mg/dL (ref 0–40)

## 2012-01-14 LAB — PRO B NATRIURETIC PEPTIDE: Pro B Natriuretic peptide (BNP): 12.6 pg/mL (ref 0–125)

## 2012-01-14 LAB — CREATININE, SERUM: Creatinine, Ser: 0.73 mg/dL (ref 0.50–1.35)

## 2012-01-14 MED ORDER — MORPHINE SULFATE 4 MG/ML IJ SOLN
6.0000 mg | Freq: Once | INTRAMUSCULAR | Status: AC
  Administered 2012-01-14: 6 mg via INTRAVENOUS
  Filled 2012-01-14: qty 2

## 2012-01-14 MED ORDER — LABETALOL HCL 5 MG/ML IV SOLN
10.0000 mg | INTRAVENOUS | Status: DC | PRN
  Filled 2012-01-14 (×2): qty 4

## 2012-01-14 MED ORDER — ENOXAPARIN SODIUM 40 MG/0.4ML ~~LOC~~ SOLN
40.0000 mg | SUBCUTANEOUS | Status: DC
  Administered 2012-01-14 – 2012-01-15 (×2): 40 mg via SUBCUTANEOUS
  Filled 2012-01-14 (×3): qty 0.4

## 2012-01-14 MED ORDER — SODIUM CHLORIDE 0.9 % IV BOLUS (SEPSIS)
1000.0000 mL | Freq: Once | INTRAVENOUS | Status: AC
  Administered 2012-01-14: 1000 mL via INTRAVENOUS

## 2012-01-14 MED ORDER — DEXTROSE 5 % IV SOLN
1.0000 g | Freq: Once | INTRAVENOUS | Status: AC
  Administered 2012-01-14: 1 g via INTRAVENOUS
  Filled 2012-01-14: qty 10

## 2012-01-14 MED ORDER — ONDANSETRON HCL 4 MG PO TABS: 4.0000 mg | ORAL_TABLET | Freq: Four times a day (QID) | ORAL | Status: DC | PRN

## 2012-01-14 MED ORDER — ONDANSETRON HCL 4 MG/2ML IJ SOLN
4.0000 mg | Freq: Once | INTRAMUSCULAR | Status: AC
  Administered 2012-01-14: 4 mg via INTRAVENOUS
  Filled 2012-01-14: qty 2

## 2012-01-14 MED ORDER — SODIUM CHLORIDE 0.9 % IJ SOLN
3.0000 mL | Freq: Two times a day (BID) | INTRAMUSCULAR | Status: DC
  Administered 2012-01-14 – 2012-01-16 (×4): 3 mL via INTRAVENOUS

## 2012-01-14 MED ORDER — OXYCODONE HCL 5 MG PO TABS
10.0000 mg | ORAL_TABLET | Freq: Four times a day (QID) | ORAL | Status: DC | PRN
  Administered 2012-01-14 – 2012-01-15 (×2): 10 mg via ORAL
  Filled 2012-01-14 (×2): qty 2

## 2012-01-14 MED ORDER — PANTOPRAZOLE SODIUM 40 MG PO TBEC
40.0000 mg | DELAYED_RELEASE_TABLET | Freq: Every day | ORAL | Status: DC
  Administered 2012-01-15 – 2012-01-16 (×2): 40 mg via ORAL
  Filled 2012-01-14 (×2): qty 1

## 2012-01-14 MED ORDER — ASPIRIN EC 81 MG PO TBEC
81.0000 mg | DELAYED_RELEASE_TABLET | Freq: Every day | ORAL | Status: DC
  Administered 2012-01-14 – 2012-01-15 (×2): 81 mg via ORAL
  Filled 2012-01-14 (×3): qty 1

## 2012-01-14 NOTE — H&P (Signed)
Resident Addendum to Medical Student Admission H&P   I have seen and examined the patient myself, and I have reviewed the note by Carlynn Purl, MS IV and was present during the interview and physical exam.  Please see below for findings, assessment, and plan.   Chief Complaint: back pain, hematuria, chest pain  History of Present Illness: Patient is a 56 y.o. male with a PMHx of known metastatic prostate cancer (s/p TURP and bilateral orchiectomy in 06/2010), untreated hypertension, and ongoing tobacco abuse who presents to [facility] for evaluation of worsening low back pain x 2 days, intermittent gross hematuria x 1 year, and lack of resources to continue prostate cancer treatment. In regards to his back pain, patient indicates a 4 day history of sharp (like a pin), intermittent mid-to upper back pain. Currently rated 6/10 in severity. At its worst, the pain is rated 10/10 in severity. Aggravating factors include: sitting for prolonged period of time, and not changing positions. Alleviating factors include: acetaminophen (2 every 2-3 hours) and change in body position. He has not had any weakness of the upper extremities - for example, no issue with opening or closing doors or jars. He does occasionally have difficulty with lifting his right arm, but attributes it moreso to right shoulder pain. Patient does indicate 2 month history of sensation of his right leg suddenly getting numb for a second at a time, and occuring 3-4 times a day. Can occur both at rest and with walking, and gives a sensation of stumbling. Denies loss of sensation of numbness/ tingling of fingers, hands, upper extremities. He otherwise denies recent trauma, falls, car accidents or direct trauma to the area. Denies prolonged steroid use. Patient denies numbness, leg weakness, new numbness, new weakness, new tingling, fever, weight loss, incontinence, or perineal numbness. Confirms intermittent gross hematuria over last year, but denies  dribbling, dysuria.  Lastly, Stephen Stokes has also experienced 1 episode of central chest tightness that lasted for several hours with associated shortness of breath. Denies associated nausea, vomiting, diaphoresis. States he tried multiple things, such as positional changes (including sitting upright and leaning forward) without resolution of the pain. He eventually just went back to sleep and awoke with resolution of the chest pain.     Medications: ED-Administered Medications Medication Dose  . [COMPLETED] cefTRIAXone (ROCEPHIN) 1 g in dextrose 5 % 50 mL IVPB  1 g  . labetalol (NORMODYNE,TRANDATE) injection 10 mg  10 mg  . [COMPLETED] morphine 4 MG/ML injection 6 mg  6 mg  . [COMPLETED] ondansetron (ZOFRAN) injection 4 mg  4 mg  . [COMPLETED] sodium chloride 0.9 % bolus 1,000 mL  1,000 mL    Current Outpatient Prescriptions  Medication Sig Dispense Refill  . VITAMIN A PO Take 1 capsule by mouth daily.      Marland Kitchen VITAMIN D, CHOLECALCIFEROL, PO Take 1 tablet by mouth daily.         Allergies: No Known Allergies   Medical History: Reviewed   Surgical History: Reviewed   Social History: Reviewed   Family History: Reviewed    Review of Systems: Constitutional:  denies fever, chills, diaphoresis, appetite change and fatigue.  HEENT: denies photophobia, eye pain, redness, hearing loss, ear pain, congestion, sore throat, rhinorrhea, sneezing, neck pain, neck stiffness and tinnitus.  Respiratory: denies SOB, DOE, cough, chest tightness, and wheezing.  Cardiovascular: admits to chest pain. Denies palpitations and leg swelling.  Gastrointestinal: denies nausea, vomiting, abdominal pain, diarrhea, constipation. Confirms scant blood in stool  on toilet paper.  Genitourinary: admits to hematuria. Denies dysuria, urgency, frequency, flank pain and difficulty urinating.  Musculoskeletal: admits to  myalgias, back pain. Denies joint swelling, arthralgias and gait problem.   Skin:  denies pallor, rash and wound.  Neurological: denies dizziness, seizures, syncope, weakness, light-headedness, numbness and headaches.   Hematological: denies adenopathy, easy bruising, personal or family bleeding history.  Psychiatric/ Behavioral: denies suicidal ideation, mood changes, confusion, nervousness, sleep disturbance and agitation.    Vital Signs: Blood pressure 145/84, pulse 64, temperature 98.5 F (36.9 C), temperature source Oral, resp. rate 18, height 5\' 10"  (1.778 m), weight 254 lb 13.6 oz (115.6 kg), SpO2 97.00%.  Physical Exam: General: Vital signs reviewed and noted. Well-developed, well-nourished, in no acute distress; alert, appropriate and cooperative throughout examination.  Head: Normocephalic, atraumatic.  Eyes: PERRL, EOMI, No signs of anemia or jaundince.  Nose: Mucous membranes moist, not inflammed, nonerythematous.  Throat: Oropharynx nonerythematous, no exudate appreciated.   Neck: No deformities, masses, or tenderness noted. Supple, no JVD.  Lungs:  Normal respiratory effort. Clear to auscultation BL without crackles or wheezes.  Heart: RRR. S1 and S2 normal without gallop, murmur, or rubs.  Abdomen:  BS normoactive. Soft, Nondistended, non-tender.  No masses or organomegaly.  Extremities: No pretibial edema. Back - tenderness to palpation over left lateral ribs, and mid midline TTP.   Neurologic: A&O X3, CN II - XII are grossly intact. Motor strength is 5/5 in the all 4 extremities, Sensations intact to light touch, Cerebellar signs negative.  Skin: No visible rashes, scars.  GU: Prostate gland was nontender, boddy, enlarged. Normal rectal tone without evidence of external hemorrhoids.    Lab results: Comprehensive Metabolic Panel:    Component Value Date/Time   NA 139 01/14/2012 0933   K 3.8 01/14/2012 0933   CL 102 01/14/2012 0933   CO2 25 01/14/2012 0933   BUN 17 01/14/2012 0933   CREATININE 0.73 01/14/2012 1744   GLUCOSE 133* 01/14/2012 0933    CALCIUM 9.9 01/14/2012 0933   AST 24 01/14/2012 0933   ALT 40 01/14/2012 0933   ALKPHOS 181* 01/14/2012 0933   BILITOT 0.2* 01/14/2012 0933   PROT 8.1 01/14/2012 0933   ALBUMIN 3.9 01/14/2012 0933   CBC:    Component Value Date/Time   WBC 13.8* 01/14/2012 1744   HGB 11.6* 01/14/2012 1744   HCT 36.0* 01/14/2012 1744   PLT 311 01/14/2012 1744   MCV 90.9 01/14/2012 1744   NEUTROABS 10.3* 01/14/2012 0933   LYMPHSABS 4.6* 01/14/2012 0933   MONOABS 1.2* 01/14/2012 0933   EOSABS 0.3 01/14/2012 0933   BASOSABS 0.0 01/14/2012 0933    Basename 01/14/12 1157  COLORURINE RED*  LABSPEC >1.030*  PHURINE 6.5  GLUCOSEU NEGATIVE  HGBUR LARGE*  BILIRUBINUR MODERATE*  KETONESUR NEGATIVE  PROTEINUR >300*  UROBILINOGEN 1.0  NITRITE POSITIVE*  LEUKOCYTESUR TRACE*     Imaging results:   Dg Chest 2 View (01/14/2012) - Enlargement of cardiac silhouette. Chronic lung changes. No definite acute cardiopulmonary abnormality. Diffuse osseous metastatic disease demonstrated by a prior bone scan is less well visualized radiographically.   Original Report Authenticated By: Ulyses Southward, M.D.     Other results:  EKG (01/14/2012) - Sinus tachycardia, regular rate of approximately 130 bpm, normal axis, ST segments: nonspecific ST changes, mild T wave inversions in II, III, aVF.    Assessment & Plan: Patient is a 56 y.o., male with a PMHx of metastatic prostate cancer (s/p TURP and bilateral orchiectomy) who was admitted  to [facility] on 01/14/2012 with symptoms of acute back pain, hematuria, and chest pain.   1) Back pain - unclear etiology. He is certainly at increased risk for axial skeletal involvement in setting his metastatic prostate cancer with known diffuse increased osseous remodelling involving ribs, spine, pelvis - which was noted on bone scan in 06/2010. This would be most consistent with his presenting symptoms. It is not quite as clear why the pain is intermittent, and in different unrelated locations, and of such  acute onset because would typically bony mets to present in an insidious onset with more of a constant pain. Of concern is this right lower extremity intermittent numbness that the patient describes - although sensation testing reveals full sensation to light and pinprick touch. The neurologic exam is also completely normal.  - Oxycodone for pain control - PT consult to assess gait stability, equipment needs. - May consider MRI of the thoracic and lumbar spine to evaluate for spinal involvement of prostate cancer +/- head imaging given transient upper and lower extremity symptoms - Fall precautions.  2) Atypical chest pain - cause for his pain is unclear, possibly MSK versus anxiety related (as the patient is very concerned about his illness). His pain would be very atypical for cardiac origin. Hwever, given EKG findings, and his additional cardiac risk factors including ongoing tobacco abuse, age, obesity, and potential exposure to chemotherapy, ischemic cause must also be excluded. Per his report, there may have been potential chemotherapy treatments, which could place the patient at increased risk for cardiomyopathy (although no indication of acute CHF exacerbation on examination) - therefore, this seems less likely a contributing cause. As well, he has not received radiation therapy to his knowledge and clinical s/s are inconsistent with pericarditis.  - Check lipid panel, A1c for risk stratification. - Cycle cardiac enzymes. - EKG in AM.  - Start aspirin.  3) Hematuria - admission Hgb 12.3, which is at his baseline. Patient reports this intermittent hematuria over last 1 year, without acute change. This is likely secondary to the hypervascularity associated with his prostate cancer, which previously invaded the base of the bladder. Exam reveals some bogginess and fullness of prostate, but indication of hypertrophy to cause obstruction.  - Urology consult for consideration of cystoscopy. - Monitor  CBC q8h initially, then will space out.  4) Metastatic prostate adenocarcinoma - s/p TURP and bilateral orchiectomy in 06/2010 (thereby surgical castration to provide androgen deprivation therapy). Patient also admits to monthly injections, of which he is uncertain. He also received adjunctive chemotherapy, which may have served a role as either dual treatment for his disease versus possibly to treat castrate resistant disease? Will remain unclear until records from his outpatient urologist, Dr. Patsi Sears are available. States his last treatment was 3 months ago. - Will start oxycodone for his pain. - Requested outpatient records - to clarify current status of disease, additional treatment options. - Will request urology consult - to assess appropriate continued treatment (chemo, etc), possible need for repeat cystoscopy, and question of how to pursue continued care given current financial constraints. - Will request social work consult   5) Hypertension - patient is not on any outpatient medications, but carries this diagnosis. Will plan to monitor first, treat pain, to see if elevated blood pressures reflective of poor pain control, and consider addition of medication if indicated. Of note, he does not have hypercalcemia at present, however, if such arises (in setting of his bone mets), will likely prefer to avoid HCTZ,  because could further exacerbate. - Monitor for now. - Treat pain. - Continue to readdress.   DVT PPX - low molecular weight heparin CODE STATUS - will need to be discussed with patient.   Signed: Johnette Abraham, D.OConsuello Bossier, Internal Medicine Resident Pager: [phone] (7AM-12PM, then as per primary service (see sticky note)) 01/14/2012, 10:28 PM

## 2012-01-14 NOTE — ED Notes (Signed)
Pt reports upper back pain x 3-4 days. States pain comes and goes. Denies nausea/vomiting. Reports shortness of breath, states last night pain worse with inspiration. Pain 6/10. Denies known back injury. Describes pain as sharp as if pen is sticking in to back.

## 2012-01-14 NOTE — ED Provider Notes (Signed)
History     CSN: [id number]  Arrival date & time 01/14/12  1020   First MD Initiated Contact with Patient 01/14/12 1044      No chief complaint on file.   (Consider location/radiation/quality/duration/timing/severity/associated sxs/prior treatment) HPI Stephen Stokes is a 56 y.o. male with past medical history significant for metastatic prostate adenocarcinoma complaining of a thoracic back pain . Patient has active prostate cancer he is not receiving any treatment because he's uninsured. Patient also reports a dyspnea on exertion that has been worsening over the course of the last several days. Patient had a fleeting chest pain last night he is currently chest pain-free. Denies any nausea or vomiting. He reports a hematuria x3 days. 20 pack years history and he is an active smoker. Denies fever, weight loss.   Past Medical History  Diagnosis Date  . Cancer   . Hypertension     Past Surgical History  Procedure Date  . Testicle removed     No family history on file.  History  Substance Use Topics  . Smoking status: Current Every Day Smoker  . Smokeless tobacco: Not on file  . Alcohol Use: Yes     Comment: occ      Review of Systems  Constitutional: Negative for fever.  Respiratory: Negative for shortness of breath.   Cardiovascular: Negative for chest pain.  Gastrointestinal: Negative for nausea, vomiting, abdominal pain and diarrhea.  Genitourinary: Positive for hematuria.  Musculoskeletal: Positive for back pain.  All other systems reviewed and are negative.    Allergies  Review of patient's allergies indicates no known allergies.  Home Medications   Current Outpatient Rx  Name  Route  Sig  Dispense  Refill  . VITAMIN A PO   Oral   Take 1 capsule by mouth daily.         Marland Kitchen VITAMIN D (CHOLECALCIFEROL) PO   Oral   Take 1 tablet by mouth daily.           BP 157/116  Pulse 148  Temp 98.8 F (37.1 C) (Oral)  Resp 20  SpO2 100%  Physical Exam    Nursing note and vitals reviewed. Constitutional: He is oriented to person, place, and time. He appears well-developed and well-nourished. No distress.  HENT:  Head: Normocephalic and atraumatic.  Eyes: Conjunctivae normal and EOM are normal. Pupils are equal, round, and reactive to light.  Neck: Normal range of motion.  Cardiovascular: Normal rate, regular rhythm and intact distal pulses.   Pulmonary/Chest: Effort normal and breath sounds normal. No stridor.  Abdominal: Soft. Bowel sounds are normal. He exhibits no distension and no mass. There is no tenderness. There is no rebound and no guarding.  Musculoskeletal: Normal range of motion.       TTP of midline thoracic spine.   Neurological: He is alert and oriented to person, place, and time.  Psychiatric: He has a normal mood and affect.    ED Course  Procedures (including critical care time)  Labs Reviewed  CBC WITH DIFFERENTIAL - Abnormal; Notable for the following:    WBC 16.4 (*)     RBC 4.16 (*)     Hemoglobin 12.3 (*)     HCT 37.6 (*)     Neutro Abs 10.3 (*)     Lymphs Abs 4.6 (*)     Monocytes Absolute 1.2 (*)     All other components within normal limits  COMPREHENSIVE METABOLIC PANEL - Abnormal; Notable for the following:  Glucose, Bld 133 (*)     Alkaline Phosphatase 181 (*)     Total Bilirubin 0.2 (*)     All other components within normal limits  URINALYSIS, ROUTINE W REFLEX MICROSCOPIC - Abnormal; Notable for the following:    Color, Urine RED (*)  BIOCHEMICALS MAY BE AFFECTED BY COLOR   APPearance TURBID (*)     Specific Gravity, Urine >1.030 (*)     Hgb urine dipstick LARGE (*)     Bilirubin Urine MODERATE (*)     Protein, ur >300 (*)     Nitrite POSITIVE (*)     Leukocytes, UA TRACE (*)     All other components within normal limits  APTT - Abnormal; Notable for the following:    aPTT 42 (*)     All other components within normal limits  URINE MICROSCOPIC-ADD ON - Abnormal; Notable for the  following:    Squamous Epithelial / LPF FEW (*)     Bacteria, UA FEW (*)     Crystals URIC ACID CRYSTALS (*)     All other components within normal limits  POCT I-STAT TROPONIN I  PRO B NATRIURETIC PEPTIDE  PROTIME-INR   Dg Chest 2 View  01/14/2012  *RADIOLOGY REPORT*  Clinical Data: Chest pain and pressure, history prostate cancer  CHEST - 2 VIEW  Comparison: 06/05/2011  Findings: Enlargement of cardiac silhouette. Tortuous aorta. Mediastinal contours and pulmonary vascularity otherwise normal. Chronic accentuation of basilar markings stable. Minimal pleural thickening lateral aspect of the lower chest bilaterally stable. No definite infiltrate, pleural effusion or pneumothorax. Small sclerotic focus within proximal left humeral metaphysis. Scattered endplate spur formation thoracic spine.  IMPRESSION: Enlargement of cardiac silhouette. Chronic lung changes. No definite acute cardiopulmonary abnormality. Diffuse osseous metastatic disease demonstrated by a prior bone scan is less well visualized radiographically.   Original Report Authenticated By: Ulyses Southward, M.D.     Date: 01/14/2012  Rate: 145  Rhythm: sinus tachycardia  QRS Axis: normal  Intervals: normal  ST/T Wave abnormalities: nonspecific ST/T changes  Conduction Disutrbances:none  Narrative Interpretation:   Old EKG Reviewed: unchanged  1. Hematuria   2. Prostate cancer metastatic to multiple sites       MDM  Pt with metastatic prostate ca with new onset hematuria and thoracic back pain. Pt is without any outpt primary or oncologic care.  Leukocytosis 16.4  Significant pain with gross hematuria, with nitrites and leuksterase.   Consult from teaching service Dr. Shelda Pal appreciated: She will come to see and admit the patient.          Wynetta Emery, PA-C 01/14/12 1312

## 2012-01-14 NOTE — H&P (Signed)
RESIDENT ADDENDUM TO MEDICAL STUDENT NOTE  I have seen and examined the patient, and reviewed the H&P note by Carlynn Purl, MS IV and discussed the care of the patient with them. Please see my compete H&P note from 01/14/2012 for further details regarding assessment and plan.    Signed: Johnette Abraham, D.OConsuello Bossier, Internal Medicine Resident Pager: [phone] (7AM-12PM, then as per primary service (see sticky note)) 01/14/2012, 10:29 PM

## 2012-01-14 NOTE — H&P (Signed)
[facility] Note Date: 01/14/2012  Patient name: Stephen Stokes Medical record number: [id number] Date of birth: Mar 30, 1955 Age: 56 y.o. Gender: male PCP: Burtis Junes, MD  Medical Service: Internal Medicine Teaching Service B1  Attending physician: Dr. Meredith Pel     Chief Complaint: Back Pain  History of Present Illness:  The patient is a 56 y/o male with a history of metastatic prostate cancer diagnosed in Jan 2012 treated by TURP in April 2012 and HTN.  He presents to the ED with a 2 day history of mid thoracic back pain currently 6/10, at worst 10/10. He describes the pain at sharp and it radiates variably to different extremities.  The pain is partially relieved by tylenol which the patient takes every 3-4 hours.  The patient has had back pain secondary to bone mets intermittently since being diagnosed in January 2012, he reports this pain is similar but the location is more superior on his spine.  Pt has assc symptoms of RLE numbness and weakness for 1 month which causes some gait disturbance, and RUE numbness for 2 weeks.  Pt also complains of Chest pain that started the night PTA when while trying to fall asleep.  He describes the pain as tightness, retrosternal, location over his central chest, non-radiating, currently a 1/10, non excertional.  Pt reports he has taken nothing for the Chest Pain other than the tylenol he took for his back pain.  Pt also reports he has had hematuria intermittently for a year.  He reports his urine has a brownish/red color.  He denies dysuria, incontinence.  Pertinent positives on ROS are blurry vision in the morning, blood in stool, DOE x 1 year. Pertinent Neg on ROS are no F/C, SOB, LE edema, flank pain, abdominal pain, constipation, diarrhea.  Pt reports his PCP is Dr. Bruna Potter who he last saw 4 months ago.  His urologist is Dr. Patsi Sears who he saw 3 months ago.  He was receiving a injection once a month from Dr. Patsi Sears but has not  received it since his last visit because he has a high deductible on his insurance.  Meds: No current outpatient prescriptions on file.  Allergies: Allergies as of 01/14/2012  . (No Known Allergies)   Past Medical History  Diagnosis Date  . Hypertension   . Shortness of breath   . Cancer     prostate  . Hematuria 01/14/2012   Past Surgical History  Procedure Date  . Testicle removed   . Tonsillectomy    History reviewed. No pertinent family history. History   Social History  . Marital Status: Single    Spouse Name: N/A    Number of Children: N/A  . Years of Education: N/A   Occupational History  . Not on file.   Social History Main Topics  . Smoking status: Current Every Day Smoker -- 0.5 packs/day for 39 years    Types: Cigarettes  . Smokeless tobacco: Never Used  . Alcohol Use: Yes     Comment: occ  . Drug Use: No  . Sexually Active:    Other Topics Concern  . Not on file   Social History Narrative  . No narrative on file    Review of Systems: Constitutional: negative for chills, fevers and night sweats Eyes: positive for visual disturbance, negative for irritation and redness Ears, nose, mouth, throat, and face: negative for earaches, hearing loss and tinnitus Respiratory: positive for cough and dyspnea on exertion, negative for hemoptysis and wheezing Cardiovascular:  positive for chest pressure/discomfort and palpitations, negative for exertional chest pressure/discomfort, irregular heart beat and near-syncope Gastrointestinal: negative for abdominal pain, constipation, diarrhea, nausea and vomiting Genitourinary:positive for hematuria, negative for decreased stream, dysuria and urinary incontinence Hematologic/lymphatic: negative for bleeding and easy bruising Musculoskeletal:positive for back pain and muscle weakness, negative for arthralgias and neck pain Neurological: positive for gait problems, paresthesia and weakness, negative for headaches,  memory problems and seizures  Physical Exam: Blood pressure 138/80, pulse 70, temperature 98.1 F (36.7 C), temperature source Oral, resp. rate 18, height 5\' 10"  (1.778 m), weight 115.6 kg (254 lb 13.6 oz), SpO2 99.00%. General appearance: alert, cooperative, no distress and moderately obese Head: Normocephalic, without obvious abnormality, atraumatic Eyes: conjunctivae/corneas clear. PERRL, EOM's intact. Fundi benign. Back: TTP T5-7 Lungs: clear to auscultation bilaterally Heart: regular rate and rhythm, S1, S2 normal, no murmur, click, rub or gallop Abdomen: soft, non-tender; bowel sounds normal; no masses,  no organomegaly Rectal: a slight irregular is noted that is consistent with a history of prior TURP and stool in rectal valut Extremities: extremities normal, atraumatic, no cyanosis or edema Pulses: 2+ and symmetric Skin: Normal Skin turgor, no rashes, b/l LE dry and scaly Neurologic: Sensory: normal tactile sense T-1, T-2, T-3, T-4, T-5, T-6, T-7, T-8, T-9, T-10, T-11 and T-12 bilaterally, L-1, L-2, L-3, L-4 and L-5 bilaterally and S1 bilaterally Motor: grade 5 b/l knee flexion/extension, b/l ankle dorsiflexion/plantarflexion, inversion/eversion Reflexes: quadriceps reflex (L-2 to L-4) right and left 2/4 Achilles reflex (L-5 to S-2) right and left 2/4  Lab results: CBC    Component Value Date/Time   WBC 13.8* 01/14/2012 1744   RBC 3.96* 01/14/2012 1744   HGB 11.6* 01/14/2012 1744   HCT 36.0* 01/14/2012 1744   PLT 311 01/14/2012 1744   MCV 90.9 01/14/2012 1744   MCH 29.3 01/14/2012 1744   MCHC 32.2 01/14/2012 1744   RDW 13.8 01/14/2012 1744   LYMPHSABS 4.6* 01/14/2012 0933   MONOABS 1.2* 01/14/2012 0933   EOSABS 0.3 01/14/2012 0933   BASOSABS 0.0 01/14/2012 0933    CMP     Component Value Date/Time   NA 139 01/14/2012 0933   K 3.8 01/14/2012 0933   CL 102 01/14/2012 0933   CO2 25 01/14/2012 0933   GLUCOSE 133* 01/14/2012 0933   BUN 17 01/14/2012 0933   CREATININE 0.73 01/14/2012  1744   CALCIUM 9.9 01/14/2012 0933   PROT 8.1 01/14/2012 0933   ALBUMIN 3.9 01/14/2012 0933   AST 24 01/14/2012 0933   ALT 40 01/14/2012 0933   ALKPHOS 181* 01/14/2012 0933   BILITOT 0.2* 01/14/2012 0933   GFRNONAA >90 01/14/2012 1744   GFRAA >90 01/14/2012 1744   Troponin (Point of Care Test)  [facility] 01/14/12 1047  TROPIPOC 0.01     Results for AKIEL, FENNELL (MRN [id number]) as of 01/14/2012 18:28  Ref. Range 01/14/2012 10:43  Prothrombin Time Latest Range: 11.6-15.2 seconds 13.7  Results for KAILAND, SEDA (MRN [id number]) as of 01/14/2012 18:28  Ref. Range 01/14/2012 10:43  INR Latest Range: 0.00-1.49  1.06   Results for DARSH, VANDEVOORT (MRN [id number]) as of 01/14/2012 18:28  Ref. Range 01/14/2012 11:57  Color, Urine Latest Range: YELLOW  RED (A)  APPearance Latest Range: CLEAR  TURBID (A)  Specific Gravity, Urine Latest Range: 1.005-1.030  >1.030 (H)  pH Latest Range: 5.0-8.0  6.5  Glucose Latest Range: NEGATIVE mg/dL NEGATIVE  Bilirubin Urine Latest Range: NEGATIVE  MODERATE (A)  Ketones, ur Latest Range: NEGATIVE mg/dL NEGATIVE  Protein  Latest Range: NEGATIVE mg/dL >213 (A)  Urobilinogen, UA Latest Range: 0.0-1.0 mg/dL 1.0  Nitrite Latest Range: NEGATIVE  POSITIVE (A)  Leukocytes, UA Latest Range: NEGATIVE  TRACE (A)  Hgb urine dipstick Latest Range: NEGATIVE  LARGE (A)  Urine-Other No range found MUCOUS PRESENT  WBC, UA Latest Range: <3 WBC/hpf 0-2  RBC / HPF Latest Range: <3 RBC/hpf TOO NUMEROUS TO COUNT  Squamous Epithelial / LPF Latest Range: RARE  FEW (A)  Bacteria, UA Latest Range: RARE  FEW (A)  Crystals Latest Range: NEGATIVE  URIC ACID CRYSTALS (A)   Results for BRONX, BROGDEN (MRN [id number]) as of 01/14/2012 18:28  Ref. Range 01/14/2012 15:50  Fecal Occult Blood, POC No range found NEGATIVE      Imaging results:  Dg Chest 2 View  01/14/2012  *RADIOLOGY REPORT*  Clinical Data: Chest pain and pressure, history prostate cancer  CHEST - 2 VIEW  Comparison: 06/05/2011   Findings: Enlargement of cardiac silhouette. Tortuous aorta. Mediastinal contours and pulmonary vascularity otherwise normal. Chronic accentuation of basilar markings stable. Minimal pleural thickening lateral aspect of the lower chest bilaterally stable. No definite infiltrate, pleural effusion or pneumothorax. Small sclerotic focus within proximal left humeral metaphysis. Scattered endplate spur formation thoracic spine.  IMPRESSION: Enlargement of cardiac silhouette. Chronic lung changes. No definite acute cardiopulmonary abnormality. Diffuse osseous metastatic disease demonstrated by a prior bone scan is less well visualized radiographically.   Original Report Authenticated By: Ulyses Southward, M.D.    Dg Thoracic Spine 4v  01/14/2012  *RADIOLOGY REPORT*  Clinical Data: Upper back pain, history of prostate carcinoma  THORACIC SPINE - 4+ VIEW  Comparison: 06/05/2011  Findings: Vertebral body height is well-maintained.  Mild osteophytic changes are noted throughout the thoracic spine. Pedicles are within normal limits.  No paraspinal mass lesion is seen.  IMPRESSION: Degenerative change without acute abnormality.   Original Report Authenticated By: Alcide Clever, M.D.     Other results: EKG: unchanged from previous tracings, nonspecific ST and T waves changes, sinus tachycardia.  Assessment & Plan by Problem:  1.  Back Pain-  Patient reports new onset of back pain in a new location with a history of metastatic prostate cancer. The pain is similar to previous back pain secondary to bone metastasis.  Considering the patients history bone metastases is high on the differential.  The differential diagnosis includes fracture, spinal stenosis, herniated disk,  degenerative disk disease, epidural spinal cord compression, muscular strain/sprain, (less likely) discitis/osteomyelitis.  Will obtain thoracic xrays to evaluate for fracture and spinal stenosis.  Neurologic exam was WNL. Will give Oxycodone for pain.   Awaiting records from PCP and urologist.  Will hold off on further imaging at this time. 2. Hematuria-  This has been an ongoing problem.  Patient has known metastatic prostate cancer as likely source.  Pt has no dysuria, acute urinary changes, or renal dysfunction.  Pt has no history of liver disease, AST/ALT normal, INR 1.06.  At this time we do not need to obtain urinary CT or cystoscopy.  Will obtain UCx.  Will Consult Urology 3. Atypical chest pain-pain is retrosternal pressure, but not relieved by rest or precipitated by exertion.  EKG obtained in ED shows sinus tachycardia, with t wave inversions seen in previous EKGs.  Trop I 0.01, will obtain second set of CE. 4. Metastatic Prostate Cancer s/p TUPR in April 2012. See in outpatient clinic by Dr. Patsi Sears.  Was receiving once monthly injection by Dr. Patsi Sears until 3 months ago.  Records  requested.  Alk phos elevated. PSA ordered.  Urology Consult Ordered. 5. Mild N. Anemia- Hgb 12.3, Hct 37.6. Pt is not symptomatic.  Stool Guiac negative.  Will obtain Hgb/Hct q6 to trend. 6.Leukocytosis-  WBC 16.4, pt afebrile. CXR neg for infiltrate.  Likely reactionary but will continue to monitor CBC, Temp.  Will obtain BCx, UCx. 7.  Poor f/u with outpatient treatment- patient reports secondary to financial considerations/high deductible.  Will obtain social work consult to see options patient may have.   This is a Psychologist, occupational Note.  The care of the patient was discussed with Dr. Saralyn Pilar and the assessment and plan was formulated with their assistance.  Please see their note for official documentation of the patient encounter.   Signed: Gust Rung 01/14/2012, 5:58 PM

## 2012-01-14 NOTE — ED Notes (Signed)
Pt reports he just started having mid-sternal CP, reports he had the same thing yesterday. Pt reports chest feels tight. VSS, pt in nad. Skin warm and dry.

## 2012-01-14 NOTE — ED Notes (Signed)
Pt is here with back pain spine pain like a nail and reports pain for couple of days.  Reports lower hip swelling.  Pt reports sob and with exertion more.  Pt had some chest pain last nite

## 2012-01-14 NOTE — ED Notes (Signed)
Patient transported to X-ray 

## 2012-01-14 NOTE — ED Notes (Signed)
Pt reports CP is gone now, reports it comes and goes.

## 2012-01-15 ENCOUNTER — Inpatient Hospital Stay (HOSPITAL_COMMUNITY): Payer: Medicaid Other

## 2012-01-15 ENCOUNTER — Other Ambulatory Visit: Payer: Self-pay

## 2012-01-15 DIAGNOSIS — Z720 Tobacco use: Secondary | ICD-10-CM | POA: Diagnosis present

## 2012-01-15 DIAGNOSIS — Z9079 Acquired absence of other genital organ(s): Secondary | ICD-10-CM

## 2012-01-15 DIAGNOSIS — E785 Hyperlipidemia, unspecified: Secondary | ICD-10-CM | POA: Diagnosis present

## 2012-01-15 DIAGNOSIS — K5790 Diverticulosis of intestine, part unspecified, without perforation or abscess without bleeding: Secondary | ICD-10-CM | POA: Diagnosis not present

## 2012-01-15 DIAGNOSIS — R310 Gross hematuria: Secondary | ICD-10-CM | POA: Diagnosis present

## 2012-01-15 DIAGNOSIS — C61 Malignant neoplasm of prostate: Secondary | ICD-10-CM | POA: Diagnosis present

## 2012-01-15 DIAGNOSIS — I10 Essential (primary) hypertension: Secondary | ICD-10-CM | POA: Diagnosis present

## 2012-01-15 DIAGNOSIS — M549 Dorsalgia, unspecified: Secondary | ICD-10-CM | POA: Diagnosis present

## 2012-01-15 DIAGNOSIS — D649 Anemia, unspecified: Secondary | ICD-10-CM | POA: Diagnosis present

## 2012-01-15 DIAGNOSIS — R0789 Other chest pain: Secondary | ICD-10-CM | POA: Diagnosis present

## 2012-01-15 LAB — RETICULOCYTES
RBC.: 3.82 MIL/uL — ABNORMAL LOW (ref 4.22–5.81)
Retic Count, Absolute: 61.1 10*3/uL (ref 19.0–186.0)
Retic Ct Pct: 1.6 % (ref 0.4–3.1)

## 2012-01-15 LAB — URINE CULTURE
Colony Count: NO GROWTH
Culture: NO GROWTH

## 2012-01-15 LAB — COMPREHENSIVE METABOLIC PANEL
ALT: 33 U/L (ref 0–53)
Alkaline Phosphatase: 170 U/L — ABNORMAL HIGH (ref 39–117)
Chloride: 102 mEq/L (ref 96–112)
GFR calc Af Amer: >90 mL/min (ref >90)
Glucose, Bld: 112 mg/dL — ABNORMAL HIGH (ref 70–99)
Potassium: 3.7 mEq/L (ref 3.5–5.1)
Sodium: 138 mEq/L (ref 135–145)
Total Bilirubin: 0.2 mg/dL — ABNORMAL LOW (ref 0.3–1.2)
Total Protein: 7.4 g/dL (ref 6.0–8.3)

## 2012-01-15 LAB — TROPONIN I
Troponin I: <0.3 ng/mL (ref <0.30)
Troponin I: <0.3 ng/mL (ref <0.30)

## 2012-01-15 LAB — CBC
HCT: 34.7 % — ABNORMAL LOW (ref 39.0–52.0)
HCT: 36.3 % — ABNORMAL LOW (ref 39.0–52.0)
MCHC: 32.2 g/dL (ref 30.0–36.0)
MCHC: 32.3 g/dL (ref 30.0–36.0)
MCV: 91.9 fL (ref 78.0–100.0)
RDW: 13.9 % (ref 11.5–15.5)
RDW: 14.1 % (ref 11.5–15.5)

## 2012-01-15 LAB — CK TOTAL AND CKMB (NOT AT ARMC)
CK, MB: 1.5 ng/mL (ref 0.3–4.0)
Relative Index: INVALID (ref 0.0–2.5)

## 2012-01-15 LAB — IRON AND TIBC: Saturation Ratios: 17 % — ABNORMAL LOW (ref 20–55)

## 2012-01-15 MED ORDER — GADOBENATE DIMEGLUMINE 529 MG/ML IV SOLN
20.0000 mL | Freq: Once | INTRAVENOUS | Status: AC
  Administered 2012-01-15: 20 mL via INTRAVENOUS

## 2012-01-15 NOTE — Consult Note (Signed)
Urology Consult  CC: Back pain  HPI: 56 year old male who has seen Dr. Patsi Sears before for adenocarcinoma prostate. His history dates back to early 2012 where he was found to have a nodular prostate exam, PSA of 1238, with subsequent biopsy revealing high-grade prostate cancer in 12/12 biopsies. At that time, the patient was found to have metastatic disease including the bone. He underwent a scrotal orchiectomy in April, 2012. He had improvement of his PSA and clinical status, with his PSA decreasing to 5.67 in March, 2013. His last visit with Dr. Patsi Sears was in April, 2013. Apparently, he was started on Xgeva, a new pharmacologic for bony metastatic disease. Unfortunately, this could not be administered on a regular basis due to the patient's Medicaid status.  He was admitted to the teaching service 2 days ago at the suggestion of friends, because of back pain. He was found to have a PSA of 148, and gross hematuria. The patient has been started on adequate pain medications, with resolution of most of his pain. Urologic consultation is requested.  PMH: Past Medical History  Diagnosis Date  . Hypertension   . 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 Patsi Sears (06/2010) // CT abdomen and pelvis  (06/2010) revealed signficant retroperitoneal and pelvic adenopathy // Bone scan (06/2010) - showed diffuse osseous abnormality  . Diverticulosis     Noted on CT abdomen (06/2010)    PSH: Past Surgical History  Procedure Date  . Testicle removed   . Tonsillectomy     Allergies: No Known Allergies  Medications: Prescriptions prior to admission  Medication Sig Dispense Refill  . VITAMIN A PO Take 1 capsule by mouth daily.      Marland Kitchen VITAMIN D, CHOLECALCIFEROL, PO Take 1 tablet by mouth daily.         Social History: History   Social History  . Marital Status: Single    Spouse Name: N/A      Number of Children: N/A  . Years of Education: N/A   Occupational History  . unemployed     previously worked as a Copy   Social History Main Topics  . Smoking status: Current Every Day Smoker -- 0.5 packs/day for 39 years    Types: Cigarettes  . Smokeless tobacco: Never Used  . Alcohol Use: Yes     Comment: occasional  . Drug Use: No  . Sexually Active: Not on file   Other Topics Concern  . Not on file   Social History Narrative   Relocated from [place], [place] near 2000. He is currently single andPreviously worked as a Copy.    Family History: History reviewed. No pertinent family history.  Review of Systems: Positive: Back pain, dark urine without clots, hot flashes Negative: .  A further 10 point review of systems was negative except what is listed in the HPI.  Physical Exam: @VITALS2 @ General: No acute distress.  Awake. Head:  Normocephalic.  Atraumatic. ENT:  EOMI.  Mucous membranes moist Neck:  Supple.  No lymphadenopathy. CV:  S1 present. S2 present. Regular rate. Pulmonary: Equal effort bilaterally.  Clear to auscultation bilaterally. Abdomen: Soft.  Non tender to palpation. Skin:  Normal turgor.  No visible rash. Extremity: No gross deformity of bilateral upper extremities.  No gross deformity of    bilateral lower extremities. Neurologic: Alert. Appropriate mood.  Penis:  Uncircumcised.  No lesions. Urethra:  Orthotopic meatus. Scrotum: No lesions.  No ecchymosis.  No erythema. Testicles absent   Studies:  Recent Labs  [facility] 01/15/12 0744 01/15/12 0019   HGB 11.2* 11.7*   WBC 13.6* 13.1*   PLT 290 305    Recent Labs  Basename 01/15/12 0019 01/14/12 1744 01/14/12 0933   NA 138 -- 139   K 3.7 -- 3.8   CL 102 -- 102   CO2 25 -- 25   BUN 15 -- 17   CREATININE 0.86 0.73 --   CALCIUM 9.4 -- 9.9   GFRNONAA >90 >90 --   GFRAA >90 >90 --     Recent Labs  Basename 01/14/12 1043   INR 1.06   APTT 42*     No  components found with this basename: ABG:2    Assessment:  1. High-grade, metastatic adenocarcinoma prostate. The patient was initially treated, on diagnosis in early 2012, with a scrotal orchiectomy. He has had progression of his PSA from 5.67 in early 2013 up 248 recently.  2. Metastatic disease to the bone, inadequately treated thus far. In a perfect world, he would be either on Zometa, a bisphosphonate or XG of a, a RAND ligase inhibitor. Unfortunately, thus far this year he has not received regular doses of this  3. Gross hematuria. The patient seems to be voiding adequately, and being on aspirin and heparin does not help the situation  Plan: 1. I think it worthwhile to see if the patient can see Dr. Patsi Sears in our office in the near future to see if he can be started on XG EVA  2. I think his hematuria will improve once he comes off his heparin. One might consider discontinuing his aspirin for short time as well. This continues, this can be evaluated in the office  3. He is not on any Lupron at this time-he underwent scrotal orchiectomy in place of this early on. I think he would be a good candidate to start Casodex at any point. Perhaps he can get a prescription for bicalutamide 50 mg 1 by mouth daily #30 with 11 refills prior to discharge. This is a generic medication and is in anti-androgen and will supplement his previously performed orchiectomy  #4 at this point, from a urologic standpoint he can go home. I would like him to be set up to see Dr. Patsi Sears in the near future, however.    Pager:[phone]

## 2012-01-15 NOTE — Progress Notes (Signed)
Patient states he has Medicaid with $4000 deductible and $1.00 copay for medications CM to continue to follow for discharge planning needs. Call to Financial counselor Maddie  regarding Medicaid status she will f/u with patient.

## 2012-01-15 NOTE — Progress Notes (Signed)
Physical Therapy Evaluation Patient Details Name: Stephen Stokes MRN: [id number] DOB: 1955-09-29 Today's Date: 01/15/2012 Time: 4098-1191 PT Time Calculation (min): 12 min  PT Assessment / Plan / Recommendation Clinical Impression  56 yo male admitted with back pain in setting of cancer with known mets to spine; Presents independent with all mobility including steps; Pain 0/10 after walking; No PT need identified at this time; will sign off    PT Assessment  Patent does not need any further PT services    Follow Up Recommendations  No PT follow up    Does the patient have the potential to tolerate intense rehabilitation      Barriers to Discharge        Equipment Recommendations  None recommended by PT    Recommendations for Other Services     Frequency      Precautions / Restrictions Precautions Precautions: Back (for comfort)   Pertinent Vitals/Pain 0/10 pain      Mobility  Bed Mobility Bed Mobility: Supine to Sit Supine to Sit: 7: Independent Details for Bed Mobility Assistance: Smooth transition Transfers Transfers: Sit to Stand;Stand to Sit Sit to Stand: 7: Independent Stand to Sit: 7: Independent Ambulation/Gait Ambulation/Gait Assistance: 7: Independent Ambulation Distance (Feet): 150 Feet Assistive device: None Ambulation/Gait Assistance Details: Gait smooth and speed WNL Gait Pattern: Within Functional Limits Stairs: Yes Stairs Assistance: 6: Modified independent (Device/Increase time) Stair Management Technique: One rail Right Number of Stairs: 12     Shoulder Instructions     Exercises     PT Diagnosis:    PT Problem List:   PT Treatment Interventions:     PT Goals    Visit Information  Last PT Received On: 01/15/12 Assistance Needed: +1 PT/OT Co-Evaluation/Treatment: Yes    Subjective Data  Subjective: Agreeable to amb Patient Stated Goal: not stated   Prior Functioning  Home Living Lives With: Family Available Help at  Discharge: Family Type of Home: House Home Access: Stairs to enter Secretary/administrator of Steps: 3 Entrance Stairs-Rails: Right Home Layout: One level Bathroom Shower/Tub: Engineer, manufacturing systems: Standard Home Adaptive Equipment: Straight cane Additional Comments: Uses straight cane occasionally Prior Function Level of Independence: Independent Able to Take Stairs?: Yes Driving: No Communication Communication: No difficulties    Cognition  Overall Cognitive Status: Appears within functional limits for tasks assessed/performed Arousal/Alertness: Awake/alert Orientation Level: Appears intact for tasks assessed Behavior During Session: [facility] for tasks performed    Extremity/Trunk Assessment Right Upper Extremity Assessment RUE ROM/Strength/Tone: [facility] for tasks assessed Left Upper Extremity Assessment LUE ROM/Strength/Tone: WFL for tasks assessed Right Lower Extremity Assessment RLE ROM/Strength/Tone: Within functional levels Left Lower Extremity Assessment LLE ROM/Strength/Tone: Within functional levels   Balance    End of Session PT - End of Session Activity Tolerance: Patient tolerated treatment well Patient left: Other (comment) (in room with OT) Nurse Communication: Mobility status  GP     Van Clines [facility] [place],  [phone]  01/15/2012, 3:07 PM

## 2012-01-15 NOTE — Evaluation (Signed)
Occupational Therapy Evaluation Patient Details Name: Stephen Stokes MRN: [id number] DOB: 23-Oct-1955 Today's Date: 01/15/2012 Time: 6213-0865 OT Time Calculation (min): 13 min  OT Assessment / Plan / Recommendation Clinical Impression  This 56 year old man was admitted with worsening lower back pain.  He has a h/o prostate ca with mets to multiple sites and hematuria.  All education was completed to minimize back pain during adls.  No further OT is needed at this time.      OT Assessment  Patient needs continued OT Services    Follow Up Recommendations  No OT follow up    Barriers to Discharge      Equipment Recommendations  None recommended by OT    Recommendations for Other Services    Frequency       Precautions / Restrictions Precautions Precautions: Back (for comfort)   Pertinent Vitals/Pain No c/o pain throughout session    ADL  Lower Body Dressing: Performed;Set up Where Assessed - Lower Body Dressing: Unsupported sitting Toilet Transfer: Performed;Independent Toilet Transfer Method: Sit to Barista: Regular height toilet Transfers/Ambulation Related to ADLs: independent walking around room ADL Comments: educated on back precautions for comfort.  Pt had no pain at time of eval.  Educated on alternative methods for adls if back hurts again. Pt kept spine straight when getting OOB--did not need rail.  Has intermittent help at home.  Pt does not have a reacher, and he does not feel like he'll need it to retrieve items.  Educated on sidestepping into tub to minimize back pain    OT Diagnosis:    OT Problem List:   OT Treatment Interventions:     OT Goals    Visit Information  Last OT Received On: 01/15/12    Subjective Data  Subjective: I have no pain right now   Prior Functioning     Home Living Lives With: Family Available Help at Discharge: Family Type of Home: House Home Access: Stairs to enter Secretary/administrator of Steps:  3 Entrance Stairs-Rails: Right Home Layout: One level Bathroom Shower/Tub: Engineer, manufacturing systems: Standard Home Adaptive Equipment: Straight cane Prior Function Level of Independence: Independent Able to Take Stairs?: Yes Driving: No Communication Communication: No difficulties         Vision/Perception     Cognition  Overall Cognitive Status: Appears within functional limits for tasks assessed/performed Arousal/Alertness: Awake/alert Orientation Level: Appears intact for tasks assessed Behavior During Session: [facility] for tasks performed    Extremity/Trunk Assessment Right Upper Extremity Assessment RUE ROM/Strength/Tone: [facility] for tasks assessed Left Upper Extremity Assessment LUE ROM/Strength/Tone: WFL for tasks assessed     Mobility Bed Mobility Bed Mobility: Rolling Right;Right Sidelying to Sit Rolling Right: 7: Independent Right Sidelying to Sit: 7: Independent Transfers Transfers: Sit to Stand Sit to Stand: 7: Independent     Shoulder Instructions     Exercise     Balance     End of Session OT - End of Session Activity Tolerance: Patient tolerated treatment well Patient left: in bed;with call bell/phone within reach  GO     Natalyah Cummiskey 01/15/2012, 12:20 PM Marica Otter, OTR/L [phone] 01/15/2012

## 2012-01-15 NOTE — Progress Notes (Signed)
Resident Addendum to Medical Student Note   I have seen and examined the patient, and agree with the the medical student assessment and plan outlined above. Please see my brief note below for additional details.  S: No acute events overnight   OBJECTIVE: VS: Reviewed  Meds: Reviewed  Labs: Reviewed  Imaging: Reviewed   Physical Exam: General: Pleasant AA male, Well-developed, well-nourished, in no acute distress; alert, appropriate and cooperative throughout examination.  HEENT: Normocephalic, atraumatic  Lungs:  Normal respiratory effort. Clear to auscultation BL without crackles or wheezes.  Heart: RRR. S1 and S2 normal without gallop, murmur, or rubs.  Abdomen:  BS normoactive. Soft, Nondistended, non-tender.  No masses or organomegaly.  Extremities: No pretibial edema.     ASSESSMENT/ PLAN: Pt is a 56 y.o. yo male with a PMHx of metastatic prostate cancer s/p TURP and bilateral orchiectomy  06/2010 who was admitted on 01/14/2012 with symptoms of back pain, hematuria and chest pain  1) Back pain, intermittent - unclear etiology and mild at this point, no evidence of axial metastatic involvement or pathological fracture on XRay. He has known diffuse increased osseous remodeling on prior bone scan. Neuro exam continues to be without focal findings. MRI with metastases involving entire thoracic spine as well as ribs and pathological fracture of fifth right rib. -Cont Oxycodone 10 mg q6h prn for pain control  - PT consult pending, will continue fall precautions until cleared - will await input from Urology before consulting Oncology  2) Atypical chest pain - likely secondary to metastatic right rib fracture, cardiac source less of concern given negative enzymes and stable EKG this morning, cont to CAD risk stratify -cont pain management - Cont aspirin.   Lipid Panel     Component Value Date/Time   CHOL 253* 01/14/2012 1745   TRIG 158* 01/14/2012 1745   HDL 31* 01/14/2012 1745   CHOLHDL 8.2 01/14/2012 1745   VLDL 32 01/14/2012 1745   LDLCALC 190* 01/14/2012 1745     3) Hematuria - likely secondary to hypervascularity associated with his prostate cancer, which previously invaded the base of the bladder.  - Urology consult for consideration of cystoscopy.  - monitor daily CBC  4) Metastatic prostate adenocarcinoma - s/p TURP and bilateral orchiectomy in 06/2010 (thereby surgical castration to provide androgen deprivation therapy) with monthly injections (likely Lupron) - cont oxycodone for his pain.  - Reviewing outpatient records - to clarify current status of disease, additional treatment options.  - urology consult  5) Hypertension - patient is not on any outpatient medications - Cont to Monitor - Cont pain management   DVT PPX - low molecular weight heparin    Length of Stay: 1   Kristie Cowman, MD PGY2, Internal Medicine Resident 01/15/2012, 2:08 PM

## 2012-01-15 NOTE — Progress Notes (Signed)
Utilization review completed.  

## 2012-01-15 NOTE — H&P (Signed)
Internal Medicine Attending Admission Note Date: 01/15/2012  Patient name: Stephen Stokes Medical record number: [id number] Date of birth: 02-Jan-1956 Age: 56 y.o. Gender: male  I saw and evaluated the patient. I reviewed the resident's note and I agree with the resident's findings and plan as documented in the resident's note, with the following additional comments.  Chief Complaint(s): Back pain, hematuria  History - key components related to admission: Patient is a 56 year old man with history of metastatic adenocarcinoma of the prostate, status post TURP and bilateral scrotal orchiectomy in April of 2012 by Dr. Patsi Sears, hypertension, tobacco, and other problems as outlined in the medical history, admitted with complaints of worsening mid-thoracic back pain and gross hematuria.  He also reports one episode of substernal chest tightness prior to admission that resolved and has not recurred.   Physical Exam - key components related to admission:  Filed Vitals:   01/14/12 2024 01/15/12 0424 01/15/12 0715 01/15/12 1350  BP: 145/84 145/83 150/76 144/80  Pulse: 64 62 77 81  Temp: 98.5 F (36.9 C) 98.4 F (36.9 C) 98.2 F (36.8 C) 98.3 F (36.8 C)  TempSrc: Oral Oral Oral Oral  Resp: 18 18 18 18   Height:      Weight:      SpO2: 97% 100% 100% 100%   General: Alert, no distress Lungs: Clear Heart: Regular; S1-S2, no S3, no S4, no murmurs Abdomen: Bowel sounds present, soft, nontender Extremities: No edema; no calf tenderness; negative Homans Neurologic: Alert and oriented; motor intact   Lab results:   Basic Metabolic Panel:  Basename 01/15/12 0019 01/14/12 1744 01/14/12 0933  NA 138 -- 139  K 3.7 -- 3.8  CL 102 -- 102  CO2 25 -- 25  GLUCOSE 112* -- 133*  BUN 15 -- 17  CREATININE 0.86 0.73 --  CALCIUM 9.4 -- 9.9  MG -- -- --  PHOS -- -- --   Liver Function Tests:  Basename 01/15/12 0019 01/14/12 0933  AST 20 24  ALT 33 40  ALKPHOS 170* 181*  BILITOT 0.2* 0.2*    PROT 7.4 8.1  ALBUMIN 3.6 3.9     CBC:  Basename 01/15/12 0744 01/15/12 0019 01/14/12 0933  WBC 13.6* 13.1* --  NEUTROABS -- -- 10.3*  HGB 11.2* 11.7* --  HCT 34.7* 36.3* --  MCV 91.3 91.9 --  PLT 290 305 --   Cardiac Enzymes:  Basename 01/15/12 0645 01/15/12 0019 01/14/12 1745  CKTOTAL 51 47 51  CKMB 1.5 1.4 1.5  CKMBINDEX -- -- --  TROPONINI <0.30 <0.30 <0.30    Hemoglobin A1C:  Basename 01/14/12 1744  HGBA1C 5.5   Fasting Lipid Panel:  Basename 01/14/12 1745  CHOL 253*  HDL 31*  LDLCALC 190*  TRIG 158*  CHOLHDL 8.2  LDLDIRECT --    Anemia Panel:  Basename 01/15/12 1157  VITAMINB12 --  FOLATE --  FERRITIN --  TIBC --  IRON --  RETICCTPCT 1.6     Coagulation:  Basename 01/14/12 1043  INR 1.06   Urinalysis    Component Value Date/Time   COLORURINE RED* 01/14/2012 1157   APPEARANCEUR TURBID* 01/14/2012 1157   LABSPEC >1.030* 01/14/2012 1157   PHURINE 6.5 01/14/2012 1157   GLUCOSEU NEGATIVE 01/14/2012 1157   HGBUR LARGE* 01/14/2012 1157   BILIRUBINUR MODERATE* 01/14/2012 1157   KETONESUR NEGATIVE 01/14/2012 1157   PROTEINUR >300* 01/14/2012 1157   UROBILINOGEN 1.0 01/14/2012 1157   NITRITE POSITIVE* 01/14/2012 1157   LEUKOCYTESUR TRACE* 01/14/2012 1157   Urine  microscopic: WBC 0-2, RBC too numerous to count, squamous epithelial few, bacteria few, uric acid crystals.   Imaging results:  Dg Chest 2 View  01/14/2012  *RADIOLOGY REPORT*  Clinical Data: Chest pain and pressure, history prostate cancer  CHEST - 2 VIEW  Comparison: 06/05/2011  Findings: Enlargement of cardiac silhouette. Tortuous aorta. Mediastinal contours and pulmonary vascularity otherwise normal. Chronic accentuation of basilar markings stable. Minimal pleural thickening lateral aspect of the lower chest bilaterally stable. No definite infiltrate, pleural effusion or pneumothorax. Small sclerotic focus within proximal left humeral metaphysis. Scattered endplate spur formation thoracic  spine.  IMPRESSION: Enlargement of cardiac silhouette. Chronic lung changes. No definite acute cardiopulmonary abnormality. Diffuse osseous metastatic disease demonstrated by a prior bone scan is less well visualized radiographically.   Original Report Authenticated By: Ulyses Southward, M.D.    Dg Thoracic Spine 4v  01/14/2012  *RADIOLOGY REPORT*  Clinical Data: Upper back pain, history of prostate carcinoma  THORACIC SPINE - 4+ VIEW  Comparison: 06/05/2011  Findings: Vertebral body height is well-maintained.  Mild osteophytic changes are noted throughout the thoracic spine. Pedicles are within normal limits.  No paraspinal mass lesion is seen.  IMPRESSION: Degenerative change without acute abnormality.   Original Report Authenticated By: Alcide Clever, M.D.    Mr Thoracic Spine W Wo Contrast  01/15/2012  *RADIOLOGY REPORT*  Clinical Data: New onset of thoracic spine pain and right-sided weakness.  Metastatic prostate cancer.  MRI THORACIC SPINE WITHOUT AND WITH CONTRAST  Technique:  Multiplanar and multiecho pulse sequences of the thoracic spine were obtained without and with intravenous contrast.  Contrast: 20mL MULTIHANCE GADOBENATE DIMEGLUMINE 529 MG/ML IV SOLN  Comparison: Radiographs dated 01/14/2012  Findings: The patient has diffuse metastatic disease involving the entire thoracic spine as well as the visualized ribs.  There is no visible extension of tumor into the spinal canal.  There is a pathologic fracture of the right fifth rib posteriorly.  There is also periosteal reaction along the posterior medial aspect of the left seventh rib which is probably due to infiltrating tumor.  The patient has very prominent fat in the spinal canal extending from approximately T4-T8.  This markedly compresses the thecal sac. On gradient imaging there is abnormal increased signal from the thoracic spinal cord at these levels suggesting compressive myelopathy.  There is no mass lesion involving the spinal cord.  After  contrast administration there is enhancement of the right fifth rib and of the left seventh rib.  No abnormal enhancement of the spinal cord.  IMPRESSION:  1.  Prominent epidural fat compresses the thecal sac and the spinal cord in the mid thoracic spine.  There is suggestion of myelopathy at the T5 through T7 levels. 2.  Diffuse metastatic disease involving all of the visualized bones.   Original Report Authenticated By: Francene Boyers, M.D.     Other results: EKG: Sinus tachycardia; nonspecific ST and T wave abnormality  Assessment & Plan by Problem:  1.  Metastatic adenocarcinoma of the prostate, with worsening midthoracic back pain and gross hematuria. Patient has known diffuse osseous metastatic disease by a nuclear medicine whole body bone scan done in March of 2012.  He was treated last year with orchiectomy, and subsequently was treated and followed by Dr. Patsi Sears as an outpatient until a few months ago; patient reports that he has not followed up because of a Medicaid deductible that he could not afford.  He now has worsening midthoracic back pain; MRI shows no metastatic mass lesion but  does show diffuse metastatic disease involving the spine.  Patient's pain today is better controlled.  The plan is urology consult regarding best management of his prostate cancer, and regarding whether medical oncology or radiation oncology consultation is advisable.  2.  Episode of substernal chest tightness.  Patient has had no recurrent symptoms; EKG showed nonspecific ST and T wave changes, and cardiac enzymes are negative.  If he has any recurrent or further symptoms, then further workup may be warranted.  His symptoms are not suggestive of pulmonary embolism, and the suspicion of PE is low.    3.  MRI finding of prominent epidural fat compressing the thecal sac and spinal cord in the midthoracic spine.  Patient has had some past intermittent transient symptoms of right lower extremity numbness and  subjective weakness, but currently is asymptomatic with intact neurologic exam.  Would review the scan with radiology and if needed discuss with neurosurgery, but I doubt that surgical intervention would be advised.  4.  Sinus tachycardia at time of admission.  This was likely related to patient's pain, and his rate is now normal with better pain control.  Would continue to monitor.  4.  Other problems and plans as per resident note.

## 2012-01-15 NOTE — ED Provider Notes (Signed)
Medical screening examination/treatment/procedure(s) were performed by non-physician practitioner and as supervising physician I was immediately available for consultation/collaboration.  Marwan T Powers, MD 01/15/12 0721 

## 2012-01-15 NOTE — Progress Notes (Signed)
Medical Student Daily Progress Note   Subjective:    Interval Events: Patient reports he is doing well, he can "barely even notice" his back pain, and his chest pain is completely gone.  He still reports red-brown urine.  No overnight events, patient remains afebrile.    Objective:    Vital Signs:   Temp:  [98.1 F (36.7 C)-98.8 F (37.1 C)] 98.2 F (36.8 C) (11/05 0715) Pulse Rate:  [62-148] 77  (11/05 0715) Resp:  [16-23] 18  (11/05 0715) BP: (138-182)/(76-116) 150/76 mmHg (11/05 0715) SpO2:  [96 %-100 %] 100 % (11/05 0715) Weight:  [115.6 kg (254 lb 13.6 oz)] 115.6 kg (254 lb 13.6 oz) (11/04 1710) Last BM Date: 01/14/12   Weights: 24-hour Weight change:   Filed Weights   01/14/12 1710  Weight: 115.6 kg (254 lb 13.6 oz)      Intake/Output:   Intake/Output Summary (Last 24 hours) at 01/15/12 0943 Last data filed at 01/15/12 0755  Gross per 24 hour  Intake    480 ml  Output      0 ml  Net    480 ml     Net since admission:    Physical Exam: GENERAL:  alert and oriented; resting comfortably in bed and in no distress.  Gait observed no obvious abnormality. EYES:  pupils equal, round, and reactive to light; sclera anicteric ENT:  moist mucosa NECK:  no JVD LUNGS:  clear to auscultation bilaterally, normal work of breathing HEART:  normal rate; regular rhythm; normal S1 and S2, no S3 or S4 appreciated; no murmurs, rubs, or clicks ABDOMEN:  soft, non-tender, normal bowel sounds, no masses palpated EXTREMITIES:  No pedal edema SKIN:  normal turgor    Labs: Basic Metabolic Panel:  Lab 01/15/12 1610 01/14/12 1744 01/14/12 0933  NA 138 -- 139  K 3.7 -- 3.8  CL 102 -- 102  CO2 25 -- 25  GLUCOSE 112* -- 133*  BUN 15 -- 17  CREATININE 0.86 0.73 0.82  CALCIUM 9.4 -- 9.9  MG -- -- --  PHOS -- -- --    Liver Function Tests:  Lab 01/15/12 0019 01/14/12 0933  AST 20 24  ALT 33 40  ALKPHOS 170* 181*  BILITOT 0.2* 0.2*  PROT 7.4 8.1  ALBUMIN 3.6 3.9    No results found for this basename: LIPASE:5,AMYLASE:5 in the last 168 hours No results found for this basename: AMMONIA:3 in the last 168 hours  CBC:  Lab 01/15/12 0744 01/15/12 0019 01/14/12 1744 01/14/12 0933  WBC 13.6* 13.1* 13.8* 16.4*  NEUTROABS -- -- -- 10.3*  HGB 11.2* 11.7* 11.6* 12.3*  HCT 34.7* 36.3* 36.0* 37.6*  MCV 91.3 91.9 90.9 90.4  PLT 290 305 311 335    Cardiac Enzymes:  Lab 01/15/12 0645 01/15/12 0019 01/14/12 1745  CKTOTAL 51 47 51  CKMB 1.5 1.4 1.5  CKMBINDEX -- -- --  TROPONINI <0.30 <0.30 <0.30    BNP (last 3 results):  Basename 01/14/12 0933  PROBNP 12.6    CBG: No results found for this basename: GLUCAP:5 in the last 168 hours  Coagulation Studies:  Basename 01/14/12 1043  LABPROT 13.7  INR 1.06    Microbiology: Results for orders placed in visit on 06/08/10  SURGICAL PCR SCREEN     Status: Normal   Collection Time   06/08/10  9:15 AM      Component Value Range Status Comment   MRSA, PCR NEGATIVE  NEGATIVE Final  Staphylococcus aureus    NEGATIVE Final    Value: NEGATIVE            The Xpert SA Assay (FDA     approved for NASAL specimens     only), is one component of     a comprehensive surveillance     program.  It is not intended     to diagnose infection nor to     guide or monitor treatment.    Other results: EKG Results:  01/15/2012 Rate:  68 bpm PR:  186 ms QRS:  86 ms QTc:  459 ms EKG: unchanged from previous tracings, normal sinus rhythm, nonspecific ST and T waves changes.  Imaging: Dg Chest 2 View  01/14/2012  *RADIOLOGY REPORT*  Clinical Data: Chest pain and pressure, history prostate cancer  CHEST - 2 VIEW  Comparison: 06/05/2011  Findings: Enlargement of cardiac silhouette. Tortuous aorta. Mediastinal contours and pulmonary vascularity otherwise normal. Chronic accentuation of basilar markings stable. Minimal pleural thickening lateral aspect of the lower chest bilaterally stable. No definite infiltrate,  pleural effusion or pneumothorax. Small sclerotic focus within proximal left humeral metaphysis. Scattered endplate spur formation thoracic spine.  IMPRESSION: Enlargement of cardiac silhouette. Chronic lung changes. No definite acute cardiopulmonary abnormality. Diffuse osseous metastatic disease demonstrated by a prior bone scan is less well visualized radiographically.   Original Report Authenticated By: Ulyses Southward, M.D.    Dg Thoracic Spine 4v  01/14/2012  *RADIOLOGY REPORT*  Clinical Data: Upper back pain, history of prostate carcinoma  THORACIC SPINE - 4+ VIEW  Comparison: 06/05/2011  Findings: Vertebral body height is well-maintained.  Mild osteophytic changes are noted throughout the thoracic spine. Pedicles are within normal limits.  No paraspinal mass lesion is seen.  IMPRESSION: Degenerative change without acute abnormality.   Original Report Authenticated By: Alcide Clever, M.D.       Medications:    Infusions:  none   Scheduled Medications:    . aspirin EC  81 mg Oral Daily  . [COMPLETED] cefTRIAXone (ROCEPHIN)  IV  1 g Intravenous Once  . enoxaparin (LOVENOX) injection  40 mg Subcutaneous Q24H  . [COMPLETED]  morphine injection  6 mg Intravenous Once  . [COMPLETED] ondansetron (ZOFRAN) IV  4 mg Intravenous Once  . pantoprazole  40 mg Oral Q1200  . [COMPLETED] sodium chloride  1,000 mL Intravenous Once  . sodium chloride  3 mL Intravenous Q12H     PRN Medications: ondansetron, oxyCODONE, [DISCONTINUED] labetalol    Assessment/ Plan:    1.  Back Pain: Patient has a history of back pain secondary to spinal mets from advanced metastatic prostate CA.  Location is new and onset is sudden.  Xrays obtained do not show any fracture, and show an interval decrease in metastatic changes.  Xrays of t-spine do reveal some degenerative changes.  No neurologic changes on physical exam, but will continue to evaluate for need of MRI of spine.  Currently on Fall precautions and oxycodone  for pain control. 2. Atypical Chest Pain:  CE neg x3.  Repeat EKG this morning shows no new changes.  Patient reports pain has resolved.  Unlikely from Cardiac origin.  Pt is at risk for PE due to hypercoag state from malginancy.  However pt is currently not tachycardic, tachypneic, sating well on RA, and chest pain has resolved.  No further workup for PE at this time. 3. Hematuria:  Patient continues to have hematuria.  Urology has been consulted to determine need for further workup.  Hgb has been stable around 11.2 to 12.3 will decrease frequency of CBC. 4. Metastatic prostate adenocarcinoma: s/p TURP and b/l orchiectomy in 06/2010.  Patient was followed by Urology until 3 months ago, but has not received treatment since then due to insurance/financial considerstaions.  Urology consult and records requested.  Social work consult requested.  Pain controled with oxycodone. 5. Hypertension:Since pain has been controlled SBP has been in 140-150 range.  Will continue to monitor BP. 6.Leukocytosis:  WBC has trended down to 13 since admission.  Pt remains afebrile, will follow BCx and UCx. 7. Mild N. Anemia:  Hgb remains stable at around 11.3 Stool guaiac negative. Will order iron studies. 8. Poor F/U with outpatient Tx:  Pt reports medicaid now charges a high deductible, which seems inconsistent. Social work consulted to find out what insurance and community resources are available.      Length of Stay: 1 days   This is a Psychologist, occupational Note.  The care of the patient was discussed with Dr. Bosie Clos and the assessment and plan formulated with their assistance.  Please see their attached note or addendum for official documentation of the daily encounter.

## 2012-01-16 DIAGNOSIS — K5730 Diverticulosis of large intestine without perforation or abscess without bleeding: Secondary | ICD-10-CM

## 2012-01-16 DIAGNOSIS — M898X9 Other specified disorders of bone, unspecified site: Secondary | ICD-10-CM | POA: Diagnosis present

## 2012-01-16 DIAGNOSIS — N1330 Unspecified hydronephrosis: Secondary | ICD-10-CM

## 2012-01-16 DIAGNOSIS — C801 Malignant (primary) neoplasm, unspecified: Secondary | ICD-10-CM

## 2012-01-16 DIAGNOSIS — D649 Anemia, unspecified: Secondary | ICD-10-CM

## 2012-01-16 LAB — CBC
Hemoglobin: 11.1 g/dL — ABNORMAL LOW (ref 13.0–17.0)
MCH: 29.1 pg (ref 26.0–34.0)
MCHC: 31.9 g/dL (ref 30.0–36.0)
Platelets: 328 10*3/uL (ref 150–400)
RBC: 3.81 MIL/uL — ABNORMAL LOW (ref 4.22–5.81)

## 2012-01-16 MED ORDER — BICALUTAMIDE 50 MG PO TABS: 50.0000 mg | ORAL_TABLET | Freq: Every day | ORAL | Status: DC

## 2012-01-16 MED ORDER — METOPROLOL TARTRATE 50 MG PO TABS
50.0000 mg | ORAL_TABLET | Freq: Every day | ORAL | Status: DC
  Administered 2012-01-16: 50 mg via ORAL
  Filled 2012-01-16: qty 1

## 2012-01-16 MED ORDER — AMLODIPINE BESYLATE 10 MG PO TABS
10.0000 mg | ORAL_TABLET | Freq: Every day | ORAL | Status: DC
  Administered 2012-01-16: 10 mg via ORAL
  Filled 2012-01-16: qty 1

## 2012-01-16 MED ORDER — PANTOPRAZOLE SODIUM 40 MG PO TBEC: 40.0000 mg | DELAYED_RELEASE_TABLET | Freq: Every day | ORAL | Status: DC

## 2012-01-16 MED ORDER — METOPROLOL TARTRATE 50 MG PO TABS: 50.0000 mg | ORAL_TABLET | Freq: Every day | ORAL | Status: DC

## 2012-01-16 MED ORDER — SIMVASTATIN 20 MG PO TABS
20.0000 mg | ORAL_TABLET | Freq: Every day | ORAL | Status: DC
  Administered 2012-01-16: 20 mg via ORAL
  Filled 2012-01-16: qty 1

## 2012-01-16 MED ORDER — FUROSEMIDE 20 MG PO TABS
20.0000 mg | ORAL_TABLET | Freq: Every day | ORAL | Status: DC
  Administered 2012-01-16: 20 mg via ORAL
  Filled 2012-01-16: qty 1

## 2012-01-16 MED ORDER — BICALUTAMIDE 50 MG PO TABS
50.0000 mg | ORAL_TABLET | Freq: Every day | ORAL | Status: DC
  Administered 2012-01-16: 50 mg via ORAL
  Filled 2012-01-16: qty 1

## 2012-01-16 MED ORDER — OXYCODONE-ACETAMINOPHEN 5-325 MG PO TABS: 1.0000 | ORAL_TABLET | Freq: Four times a day (QID) | ORAL | Status: DC | PRN

## 2012-01-16 MED ORDER — SIMVASTATIN 20 MG PO TABS: 20.0000 mg | ORAL_TABLET | Freq: Every day | ORAL | Status: DC

## 2012-01-16 MED ORDER — AMLODIPINE BESYLATE 10 MG PO TABS: 10.0000 mg | ORAL_TABLET | Freq: Every day | ORAL | Status: DC

## 2012-01-16 NOTE — Progress Notes (Signed)
AVS reviewed with patient. Teach back method used. Patient able to verbalize understanding of AVS. Questions were answered. Prescription and new medicine handout given. Telemetry discontinued. Patient remains stable. Ride home to arrive by 5 pm. Adele Barthel

## 2012-01-16 NOTE — Progress Notes (Signed)
Subjective: No acute events overnight   Objective: Vital signs in last 24 hours: Filed Vitals:   01/15/12 1700 01/15/12 2045 01/16/12 0459 01/16/12 0800  BP: 150/75 146/82 150/87 153/92  Pulse: 84 61 64 66  Temp: 98 F (36.7 C) 97.7 F (36.5 C) 98.6 F (37 C) 98.8 F (37.1 C)  TempSrc: Oral Oral Oral Oral  Resp: 18 18 18 18   Height:      Weight:  254 lb 6.6 oz (115.4 kg)    SpO2: 100% 98% 97% 98%   Weight change: -7.1 oz (-0.2 kg)  Intake/Output Summary (Last 24 hours) at 01/16/12 1249 Last data filed at 01/16/12 0845  Gross per 24 hour  Intake    360 ml  Output      4 ml  Net    356 ml    Physical Exam:  General:  Pleasant AA male, Well-developed, well-nourished, in no acute distress; alert, appropriate and cooperative throughout examination.   HEENT:  Normocephalic, atraumatic   Lungs:  Normal respiratory effort. Clear to auscultation BL without crackles or wheezes.   Heart:  RRR. S1 and S2 normal without gallop, murmur, or rubs.   Abdomen:  BS normoactive. Soft, Nondistended, non-tender. No masses or organomegaly.   Extremities:  No pretibial edema.      Lab Results: Basic Metabolic Panel:  Lab 01/15/12 1610 01/14/12 1744 01/14/12 0933  NA 138 -- 139  K 3.7 -- 3.8  CL 102 -- 102  CO2 25 -- 25  GLUCOSE 112* -- 133*  BUN 15 -- 17  CREATININE 0.86 0.73 --  CALCIUM 9.4 -- 9.9  MG -- -- --  PHOS -- -- --   Liver Function Tests:  Lab 01/15/12 0019 01/14/12 0933  AST 20 24  ALT 33 40  ALKPHOS 170* 181*  BILITOT 0.2* 0.2*  PROT 7.4 8.1  ALBUMIN 3.6 3.9    CBC:  Lab 01/16/12 0505 01/15/12 0744 01/14/12 0933  WBC 13.5* 13.6* --  NEUTROABS -- -- 10.3*  HGB 11.1* 11.2* --  HCT 34.8* 34.7* --  MCV 91.3 91.3 --  PLT 328 290 --   Cardiac Enzymes:  Lab 01/15/12 0645 01/15/12 0019 01/14/12 1745  CKTOTAL 51 47 51  CKMB 1.5 1.4 1.5  CKMBINDEX -- -- --  TROPONINI <0.30 <0.30 <0.30   BNP:  Lab 01/14/12 0933  PROBNP 12.6    Hemoglobin A1C:  Lab  01/14/12 1744  HGBA1C 5.5   Fasting Lipid Panel:  Lab 01/14/12 1745  CHOL 253*  HDL 31*  LDLCALC 190*  TRIG 158*  CHOLHDL 8.2  LDLDIRECT --    Coagulation:  Lab 01/14/12 1043  LABPROT 13.7  INR 1.06   Anemia Panel:  Lab 01/15/12 1157  VITAMINB12 677  FOLATE --  FERRITIN 968*  TIBC 261  IRON 45  RETICCTPCT 1.6   Urinalysis:  Lab 01/14/12 1157  COLORURINE RED*  LABSPEC >1.030*  PHURINE 6.5  GLUCOSEU NEGATIVE  HGBUR LARGE*  BILIRUBINUR MODERATE*  KETONESUR NEGATIVE  PROTEINUR >300*  UROBILINOGEN 1.0  NITRITE POSITIVE*  LEUKOCYTESUR TRACE*     Micro Results: Recent Results (from the past 240 hour(s))  URINE CULTURE     Status: Normal   Collection Time   01/14/12 11:57 AM      Component Value Range Status Comment   Specimen Description URINE, RANDOM   Final    Special Requests ADDED 01/14/12 1318   Final    Culture  Setup Time 01/14/2012 13:45   Final  Colony Count NO GROWTH   Final    Culture NO GROWTH   Final    Report Status 01/15/2012 FINAL   Final   CULTURE, BLOOD (ROUTINE X 2)     Status: Normal (Preliminary result)   Collection Time   01/14/12  8:10 PM      Component Value Range Status Comment   Specimen Description BLOOD LEFT HAND   Final    Special Requests BOTTLES DRAWN AEROBIC AND ANAEROBIC 5CC   Final    Culture  Setup Time 01/15/2012 01:14   Final    Culture     Final    Value:        BLOOD CULTURE RECEIVED NO GROWTH TO DATE CULTURE WILL BE HELD FOR 5 DAYS BEFORE ISSUING A FINAL NEGATIVE REPORT   Report Status PENDING   Incomplete   CULTURE, BLOOD (ROUTINE X 2)     Status: Normal (Preliminary result)   Collection Time   01/14/12  8:15 PM      Component Value Range Status Comment   Specimen Description BLOOD LEFT HAND   Final    Special Requests BOTTLES DRAWN AEROBIC AND ANAEROBIC 5CC   Final    Culture  Setup Time 01/15/2012 01:14   Final    Culture     Final    Value:        BLOOD CULTURE RECEIVED NO GROWTH TO DATE CULTURE WILL BE  HELD FOR 5 DAYS BEFORE ISSUING A FINAL NEGATIVE REPORT   Report Status PENDING   Incomplete    Studies/Results: Dg Thoracic Spine 4v  01/14/2012  *RADIOLOGY REPORT*  Clinical Data: Upper back pain, history of prostate carcinoma  THORACIC SPINE - 4+ VIEW  Comparison: 06/05/2011  Findings: Vertebral body height is well-maintained.  Mild osteophytic changes are noted throughout the thoracic spine. Pedicles are within normal limits.  No paraspinal mass lesion is seen.  IMPRESSION: Degenerative change without acute abnormality.   Original Report Authenticated By: Alcide Clever, M.D.    Mr Thoracic Spine W Wo Contrast  01/15/2012  *RADIOLOGY REPORT*  Clinical Data: New onset of thoracic spine pain and right-sided weakness.  Metastatic prostate cancer.  MRI THORACIC SPINE WITHOUT AND WITH CONTRAST  Technique:  Multiplanar and multiecho pulse sequences of the thoracic spine were obtained without and with intravenous contrast.  Contrast: 20mL MULTIHANCE GADOBENATE DIMEGLUMINE 529 MG/ML IV SOLN  Comparison: Radiographs dated 01/14/2012  Findings: The patient has diffuse metastatic disease involving the entire thoracic spine as well as the visualized ribs.  There is no visible extension of tumor into the spinal canal.  There is a pathologic fracture of the right fifth rib posteriorly.  There is also periosteal reaction along the posterior medial aspect of the left seventh rib which is probably due to infiltrating tumor.  The patient has very prominent fat in the spinal canal extending from approximately T4-T8.  This markedly compresses the thecal sac. On gradient imaging there is abnormal increased signal from the thoracic spinal cord at these levels suggesting compressive myelopathy.  There is no mass lesion involving the spinal cord.  After contrast administration there is enhancement of the right fifth rib and of the left seventh rib.  No abnormal enhancement of the spinal cord.  IMPRESSION:  1.  Prominent epidural  fat compresses the thecal sac and the spinal cord in the mid thoracic spine.  There is suggestion of myelopathy at the T5 through T7 levels. 2.  Diffuse metastatic disease involving all of the  visualized bones.   Original Report Authenticated By: Francene Boyers, M.D.    Medications: I have reviewed the patient's current medications. Scheduled Meds:   . amLODipine  10 mg Oral Daily  . bicalutamide  50 mg Oral Daily  . furosemide  20 mg Oral Daily  . [COMPLETED] gadobenate dimeglumine  20 mL Intravenous Once  . metoprolol tartrate  50 mg Oral Daily  . pantoprazole  40 mg Oral Q1200  . simvastatin  20 mg Oral q1800  . sodium chloride  3 mL Intravenous Q12H  . [DISCONTINUED] aspirin EC  81 mg Oral Daily  . [DISCONTINUED] enoxaparin (LOVENOX) injection  40 mg Subcutaneous Q24H   Continuous Infusions:  PRN Meds:.ondansetron, oxyCODONE  Assessment/Plan: Pt is a 56 y.o. yo male with a PMHx of metastatic prostate cancer s/p TURP and bilateral orchiectomy 06/2010 who was admitted on 01/14/2012 with symptoms of back pain, hematuria and chest pain   1) Metastatic prostate adenocarcinoma - s/p TURP and bilateral orchiectomy in 06/2010 with monthly injections of XG eva  - cont prior dosing of Percocet for his pain on discharge - Casodex 50 mg qd started per Urology recommendations - will f/u with Dr. Imelda Pillow office and cont generic Casodex (bicalutamide) per Urology recs   2) Hypertension - above goal after pain under-control, will need to resume prior outpt regimen with exception of Lasix which we will defer to his PCP -resume metoprolol 50 mg qd  -resume amlodipine 10 mg qd  3) Back pain, intermittent - likely secondary to diffuse thoracic vertebrae metastases. He has known diffuse increased osseous remodeling on prior bone scan. Neuro exam continues to be without focal findings.  -Cont Oxycodone 10 mg q6h prn for pain control   4) Atypical chest pain - likely secondary to pathological  fracture of fifth right rib. Cardiac etiology less likely. Will restart prior out pt medications for CAD stratification. - cont pain management  - d/c aspirin secondary to hematuria per Urology recs - restart prior home regimen of simvastatin 20 mg qd - restart beta-blocker and and CCB  Lipid Panel    Component  Value  Date/Time    CHOL  253*  01/14/2012 1745    TRIG  158*  01/14/2012 1745    HDL  31*  01/14/2012 1745    CHOLHDL  8.2  01/14/2012 1745    VLDL  32  01/14/2012 1745    LDLCALC  190*  01/14/2012 1745    5) Hematuria - likely secondary to hypervascularity associated with his prostate cancer, which previously invaded the base of the bladder. Appreciate Urology recommendations. - d/c Lovenox - d/c ASA - follow-up with Urology as outpt 01/25/2012 10am [facility]   DVT PPX - changed to SCDs in setting of hematuria   LOS: 2 days   Georgann Bramble 01/16/2012, 12:49 PM

## 2012-01-16 NOTE — Progress Notes (Addendum)
IV removed. Ride here. Patient remain stable. Escorted ambulatory patient down to exit of facility. Adele Barthel

## 2012-01-16 NOTE — Discharge Summary (Signed)
Internal Medicine Teaching [facility] Discharge Note  Name: Stephen Stokes MRN: [id number] DOB: 20-Nov-1955 56 y.o.  Date of Admission: 01/14/2012 10:35 AM Date of Discharge: 01/16/2012 Attending Physician: Farley Ly, MD  Discharge Diagnosis: Principal Problem:  *Prostate cancer metastatic to multiple sites Active Problems:  S/P TURP (status post transurethral resection of prostate)  H/O bilateral orchiectomies  Diverticulosis  Normocytic anemia  Hypertension  Hyperlipidemia  Hematuria, gross  Tobacco abuse  Back pain/ Malignant bone pain  Chest pain/ Malignant bone pain   Discharge Medications:   Medication List     As of 01/16/2012 12:50 PM    TAKE these medications         amLODipine 10 MG tablet   Commonly known as: NORVASC   Take 1 tablet (10 mg total) by mouth daily.      bicalutamide 50 MG tablet   Commonly known as: CASODEX   Take 1 tablet (50 mg total) by mouth daily.      metoprolol 50 MG tablet   Commonly known as: LOPRESSOR   Take 1 tablet (50 mg total) by mouth daily.      oxyCODONE-acetaminophen 5-325 MG per tablet   Commonly known as: PERCOCET/ROXICET   Take 1 tablet by mouth every 6 (six) hours as needed for pain.      pantoprazole 40 MG tablet   Commonly known as: PROTONIX   Take 1 tablet (40 mg total) by mouth daily at 12 noon.      simvastatin 20 MG tablet   Commonly known as: ZOCOR   Take 1 tablet (20 mg total) by mouth daily at 6 PM.      VITAMIN A PO   Take 1 capsule by mouth daily.      VITAMIN D (CHOLECALCIFEROL) PO   Take 1 tablet by mouth daily.        Disposition and follow-up:   StephenStephen Stokes was discharged from [facility] in stable condition.  At the hospital follow up visit please address his blood pressure control and compliance with anti-hypertensive regimen; pain control and need for escalation of therapy as well as follow-up and compliance with Urology recommendations including compliance  with bicalutamide.  Follow-up Appointments:     Follow-up Information    Follow up with [facility] NP. On 01/25/2012. (at 10:00am)    Contact information:   [facility] [address] [place] [address] [phone]       Follow up with Burtis Junes, MD. On 01/30/2012. (at 2pm)    Contact information:   [facility] [address] [place] [address] [phone]         Discharge Orders    Future Orders Please Complete By Expires   Diet - low sodium heart healthy      Increase activity slowly      Discharge instructions      Comments:   Your prior prescriptions have been refilled at the [facility] pharmacy. Fill and resume taking your medications. Follow-up with Dr. [facility] and Dr. Imelda Pillow clinic as scheduled.   Call MD for:  temperature >100.4      Call MD for:  persistant nausea and vomiting      Call MD for:  severe uncontrolled pain         Consultations: Treatment Team:  Kathi Ludwig, MD  Procedures Performed:  Dg Chest 2 View  01/14/2012  *RADIOLOGY REPORT*  Clinical Data: Chest  pain and pressure, history prostate cancer  CHEST - 2 VIEW  Comparison: 06/05/2011  Findings: Enlargement of cardiac silhouette. Tortuous aorta. Mediastinal contours and pulmonary vascularity otherwise normal. Chronic accentuation of basilar markings stable. Minimal pleural thickening lateral aspect of the lower chest bilaterally stable. No definite infiltrate, pleural effusion or pneumothorax. Small sclerotic focus within proximal left humeral metaphysis. Scattered endplate spur formation thoracic spine.  IMPRESSION: Enlargement of cardiac silhouette. Chronic lung changes. No definite acute cardiopulmonary abnormality. Diffuse osseous metastatic disease demonstrated by a prior bone scan is less well visualized radiographically.   Original Report Authenticated By: Ulyses Southward, M.D.     Dg Thoracic Spine 4v  01/14/2012  *RADIOLOGY REPORT*  Clinical Data: Upper back pain, history of prostate carcinoma  THORACIC SPINE - 4+ VIEW  Comparison: 06/05/2011  Findings: Vertebral body height is well-maintained.  Mild osteophytic changes are noted throughout the thoracic spine. Pedicles are within normal limits.  No paraspinal mass lesion is seen.  IMPRESSION: Degenerative change without acute abnormality.   Original Report Authenticated By: Alcide Clever, M.D.    Mr Thoracic Spine W Wo Contrast  01/15/2012  *RADIOLOGY REPORT*  Clinical Data: New onset of thoracic spine pain and right-sided weakness.  Metastatic prostate cancer.  MRI THORACIC SPINE WITHOUT AND WITH CONTRAST  Technique:  Multiplanar and multiecho pulse sequences of the thoracic spine were obtained without and with intravenous contrast.  Contrast: 20mL MULTIHANCE GADOBENATE DIMEGLUMINE 529 MG/ML IV SOLN  Comparison: Radiographs dated 01/14/2012  Findings: The patient has diffuse metastatic disease involving the entire thoracic spine as well as the visualized ribs.  There is no visible extension of tumor into the spinal canal.  There is a pathologic fracture of the right fifth rib posteriorly.  There is also periosteal reaction along the posterior medial aspect of the left seventh rib which is probably due to infiltrating tumor.  The patient has very prominent fat in the spinal canal extending from approximately T4-T8.  This markedly compresses the thecal sac. On gradient imaging there is abnormal increased signal from the thoracic spinal cord at these levels suggesting compressive myelopathy.  There is no mass lesion involving the spinal cord.  After contrast administration there is enhancement of the right fifth rib and of the left seventh rib.  No abnormal enhancement of the spinal cord.  IMPRESSION:  1.  Prominent epidural fat compresses the thecal sac and the spinal cord in the mid thoracic spine.  There is suggestion of myelopathy at  the T5 through T7 levels. 2.  Diffuse metastatic disease involving all of the visualized bones.   Original Report Authenticated By: Francene Boyers, M.D.     Admission HPI: Patient is a 56 y.o. male with a PMHx of known metastatic prostate cancer (s/p TURP and bilateral orchiectomy in 06/2010), untreated hypertension, and ongoing tobacco abuse who presents to [facility] for evaluation of worsening low back pain x 2 days, intermittent gross hematuria x 1 year, and lack of resources to continue prostate cancer treatment. In regards to his back pain, patient indicates a 4 day history of sharp (like a pin), intermittent mid-to upper back pain. Currently rated 6/10 in severity. At its worst, the pain is rated 10/10 in severity. Aggravating factors include: sitting for prolonged period of time, and not changing positions. Alleviating factors include: acetaminophen (2 every 2-3 hours) and change in body position. He has not had any weakness of the upper extremities - for example, no issue with opening or closing doors or jars. He does  occasionally have difficulty with lifting his right arm, but attributes it moreso to right shoulder pain. Patient does indicate 2 month history of sensation of his right leg suddenly getting numb for a second at a time, and occuring 3-4 times a day. Can occur both at rest and with walking, and gives a sensation of stumbling. Denies loss of sensation of numbness/ tingling of fingers, hands, upper extremities. He otherwise denies recent trauma, falls, car accidents or direct trauma to the area. Denies prolonged steroid use. Patient denies numbness, leg weakness, new numbness, new weakness, new tingling, fever, weight loss, incontinence, or perineal numbness. Confirms intermittent gross hematuria over last year, but denies dribbling, dysuria.  Lastly, Stephen Stokes has also experienced 1 episode of central chest tightness that lasted for several hours with associated shortness of breath. Denies  associated nausea, vomiting, diaphoresis. States he tried multiple things, such as positional changes (including sitting upright and leaning forward) without resolution of the pain. He eventually just went back to sleep and awoke with resolution of the chest pain.   Admission Vital Signs:  Blood pressure 145/84, pulse 64, temperature 98.5 F (36.9 C), temperature source Oral, resp. rate 18, height 5\' 10"  (1.778 m), weight 254 lb 13.6 oz (115.6 kg), SpO2 97.00%.   Admission Physical Exam:  General:  Vital signs reviewed and noted. Well-developed, well-nourished, in no acute distress; alert, appropriate and cooperative throughout examination.   Head:  Normocephalic, atraumatic.   Eyes:  PERRL, EOMI, No signs of anemia or jaundince.   Nose:  Mucous membranes moist, not inflammed, nonerythematous.   Throat:  Oropharynx nonerythematous, no exudate appreciated.   Neck:  No deformities, masses, or tenderness noted. Supple, no JVD.   Lungs:  Normal respiratory effort. Clear to auscultation BL without crackles or wheezes.   Heart:  RRR. S1 and S2 normal without gallop, murmur, or rubs.   Abdomen:  BS normoactive. Soft, Nondistended, non-tender. No masses or organomegaly.   Extremities:  No pretibial edema.  Back - tenderness to palpation over left lateral ribs, and mid midline TTP.   Neurologic:  A&O X3, CN II - XII are grossly intact. Motor strength is 5/5 in the all 4 extremities, Sensations intact to light touch, Cerebellar signs negative.   Skin:  No visible rashes, scars.   GU:  Prostate gland was nontender, boddy, enlarged. Normal rectal tone without evidence of external hemorrhoids.      Hospital Course by problem list:  1) Metastatic prostate adenocarcinoma - Diagnosed in early 2012 with PSA of 1238 and nodular prostate exam with biopsy demonstrating high-grade prostate carcinoma. He was found to have metastatic disease with diffuse osseous metastatic disease on Bone Scan 05/17/2010 with  remodelling involving ribs, spine and pelvis. Pt underwent TURP and bilateral orchiectomy in 06/2010.  He was started on monthly injections of Xgeva (RAND ligase inhibitor) for bony metastatic disease in April 2013 per his urologist Dr. Patsi Sears.  He did not continue those treatments for issues concerning his Medicaid status and affordability. During this admission, Urology was consulted and deemed pt a good candidate for Casodex.  He was subsequently initiated on its generic formulation of bicalutamide 50 mg po daily, discharged with 11 refills, follow-up scheduled with Dr. Imelda Pillow office Jan 25, 2012 at 10am and pain management with Percocet 5/325 mg q6h prn #60.  2) Hypertension - Pt was not taking any blood pressures on admission.  His blood pressure was mildly above goal in 140s-150s systolic bp. His blood pressure was initially  attributed to pain.  Once prior records were received from Dr. Imelda Pillow office, it was noted that he had been prescribed an antihypertensive regimen to consist of metoprolol 50 mg qd, amlodipine 10 mg po qd, and lasix 20 mg qd. He was subsequently restarted on the Beta-blocker, Calcium Channel blocker but we deferred resuming the loop-diuretic until further assessment as an outpatient.   3) Back pain, intermittent - likely secondary to diffuse thoracic vertebrae metastases. He has known diffuse increased osseous remodeling on prior bone scan. Neuro exam remained without focal findings throughout his admission. He was continued on Oxycodone 10 mg q6h prn with adequate pain control.  4) Atypical chest pain - Pt reported chest tightness in the substernal area prior to admission without recurrent symptoms. Cardiac etiology less likely given that his EKG demonstrated nonspecific ST and T wave changes and cardiac enzymes were negative x3. His symptoms were not suggestive of pulmonary embolism and the suspicion for PE was low.  CXR was without definite acute cardiopulmonary  abnormality but did show evidence of diffuse metastasis. MRI further revealed pathological fracture of fifth right rib which may have contributed to his discomfort. We restarted prior out pt medications for CAD stratification, including beta-blocker and calcium channel blocker and simvastatin as noted above.  Of note aspirin was discontinued given his hematuria per Urology recommendations. Pt was without chest pain throughout his admission.  Lipid Panel    Component  Value  Date/Time    CHOL  253*  01/14/2012 1745    TRIG  158*  01/14/2012 1745    HDL  31*  01/14/2012 1745    CHOLHDL  8.2  01/14/2012 1745    VLDL  32  01/14/2012 1745    LDLCALC  190*  01/14/2012 1745    5) Hematuria - No complaints of dysuria.  Urinanalysis with too numerous to count RBSc; likely secondary to hypervascularity associated with his prostate cancer, which previously invaded the base of the bladder. Urology was consulted with recommendations to discontinue Lovenox and aspirin.  He has a follow-up appointment with [facility] as outpt 01/25/2012 10 am. . Urinalysis    Component Value Date/Time   COLORURINE RED* 01/14/2012 1157   APPEARANCEUR TURBID* 01/14/2012 1157   LABSPEC >1.030* 01/14/2012 1157   PHURINE 6.5 01/14/2012 1157   GLUCOSEU NEGATIVE 01/14/2012 1157   HGBUR LARGE* 01/14/2012 1157   BILIRUBINUR MODERATE* 01/14/2012 1157   KETONESUR NEGATIVE 01/14/2012 1157   PROTEINUR >300* 01/14/2012 1157   UROBILINOGEN 1.0 01/14/2012 1157   NITRITE POSITIVE* 01/14/2012 1157   LEUKOCYTESUR TRACE* 01/14/2012 1157      Discharge Vitals:  BP 153/92  Pulse 66  Temp 98.8 F (37.1 C) (Oral)  Resp 18  Ht 5\' 10"  (1.778 m)  Wt 254 lb 6.6 oz (115.4 kg)  BMI 36.50 kg/m2  SpO2 98%  Discharge Labs:  Results for orders placed during the hospital encounter of 01/14/12 (from the past 24 hour(s))  CBC     Status: Abnormal   Collection Time   01/16/12  5:05 AM      Component Value Range   WBC 13.5 (*) 4.0 - 10.5 K/uL   RBC  3.81 (*) 4.22 - 5.81 MIL/uL   Hemoglobin 11.1 (*) 13.0 - 17.0 g/dL   HCT 08.6 (*) 57.8 - 46.9 %   MCV 91.3  78.0 - 100.0 fL   MCH 29.1  26.0 - 34.0 pg   MCHC 31.9  30.0 - 36.0 g/dL   RDW 13.9  11.5 - 15.5 %   Platelets 328  150 - 400 K/uL    Signed: Yuji Walth 01/16/2012, 12:50 PM   Time Spent on Discharge: 60 min Services Ordered on Discharge: none Equipment Ordered on Discharge: none

## 2012-01-17 LAB — FOLATE: Folate: 7.9 ng/mL (ref >5.4)

## 2012-01-21 LAB — CULTURE, BLOOD (ROUTINE X 2): Culture: NO GROWTH

## 2012-01-31 ENCOUNTER — Telehealth: Payer: Self-pay | Admitting: Oncology

## 2012-01-31 NOTE — Telephone Encounter (Signed)
C/D 01/31/12 for appt.02/13/12

## 2012-01-31 NOTE — Telephone Encounter (Signed)
S/W pt in re NP appt 12/04 @ 1:30 w/Dr. Clelia Croft.  Date at pt request.  Referring Dr. Patsi Sears Dx-Prostate Ca.  Welcome packet mailed.

## 2012-02-07 ENCOUNTER — Other Ambulatory Visit: Payer: Self-pay | Admitting: Oncology

## 2012-02-07 DIAGNOSIS — C61 Malignant neoplasm of prostate: Secondary | ICD-10-CM

## 2012-02-10 ENCOUNTER — Emergency Department (HOSPITAL_COMMUNITY): Payer: Medicaid Other

## 2012-02-10 ENCOUNTER — Emergency Department (HOSPITAL_COMMUNITY)
Admission: EM | Admit: 2012-02-10 | Discharge: 2012-02-10 | Disposition: A | Discharge status: Home / Self Care | Payer: Medicaid Other | Attending: Emergency Medicine | Admitting: Emergency Medicine

## 2012-02-10 ENCOUNTER — Encounter (HOSPITAL_COMMUNITY): Payer: Self-pay | Admitting: Emergency Medicine

## 2012-02-10 DIAGNOSIS — Z79899 Other long term (current) drug therapy: Secondary | ICD-10-CM | POA: Insufficient documentation

## 2012-02-10 DIAGNOSIS — Z8719 Personal history of other diseases of the digestive system: Secondary | ICD-10-CM | POA: Insufficient documentation

## 2012-02-10 DIAGNOSIS — M545 Low back pain, unspecified: Secondary | ICD-10-CM | POA: Insufficient documentation

## 2012-02-10 DIAGNOSIS — M549 Dorsalgia, unspecified: Secondary | ICD-10-CM

## 2012-02-10 DIAGNOSIS — N390 Urinary tract infection, site not specified: Secondary | ICD-10-CM | POA: Insufficient documentation

## 2012-02-10 DIAGNOSIS — C799 Secondary malignant neoplasm of unspecified site: Secondary | ICD-10-CM

## 2012-02-10 DIAGNOSIS — C7982 Secondary malignant neoplasm of genital organs: Secondary | ICD-10-CM | POA: Insufficient documentation

## 2012-02-10 DIAGNOSIS — F17200 Nicotine dependence, unspecified, uncomplicated: Secondary | ICD-10-CM | POA: Insufficient documentation

## 2012-02-10 DIAGNOSIS — G8929 Other chronic pain: Secondary | ICD-10-CM | POA: Insufficient documentation

## 2012-02-10 LAB — URINALYSIS, ROUTINE W REFLEX MICROSCOPIC
Glucose, UA: NEGATIVE mg/dL
Nitrite: NEGATIVE
Protein, ur: 100 mg/dL — AB
pH: 5.5 (ref 5.0–8.0)

## 2012-02-10 LAB — URINE MICROSCOPIC-ADD ON

## 2012-02-10 MED ORDER — SULFAMETHOXAZOLE-TRIMETHOPRIM 800-160 MG PO TABS: 1.0000 | ORAL_TABLET | Freq: Two times a day (BID) | ORAL | Status: DC

## 2012-02-10 MED ORDER — HYDROMORPHONE HCL PF 1 MG/ML IJ SOLN
1.0000 mg | Freq: Once | INTRAMUSCULAR | Status: AC
  Administered 2012-02-10: 1 mg via INTRAMUSCULAR
  Filled 2012-02-10: qty 1

## 2012-02-10 MED ORDER — OXYCODONE-ACETAMINOPHEN 10-325 MG PO TABS: 1.0000 | ORAL_TABLET | ORAL | Status: DC | PRN

## 2012-02-10 NOTE — ED Provider Notes (Addendum)
History     CSN: [id number]  Arrival date & time 02/10/12  1317   First MD Initiated Contact with Patient 02/10/12 1353      Chief Complaint  Patient presents with  . Back Pain    (Consider location/radiation/quality/duration/timing/severity/associated sxs/prior treatment) Patient is a 56 y.o. male presenting with back pain. The history is provided by the patient.  Back Pain  This is a chronic problem. Pertinent negatives include no chest pain, no numbness, no headaches, no abdominal pain and no weakness.   a patient with recent worsening of his chronic back pain. He has known metastatic prostate cancer. It goes to all the bones in his spine on last imaging. His pain is now uncontrolled with his 5mg  Percocet that previously controlled it. No fevers. No new numbness or weakness. No urinary retention. No fevers. No bladder or bowel problems. No recent fall or trauma. The pain is in the lower back towards the right side. It is constant.   Past Medical History  Diagnosis Date  . Hypertension   . 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 Patsi Sears (06/2010) // CT abdomen and pelvis  (06/2010) revealed signficant retroperitoneal and pelvic adenopathy // Bone scan (06/2010) - showed diffuse osseous abnormality  . Diverticulosis     Noted on CT abdomen (06/2010)    Past Surgical History  Procedure Date  . Testicle removed   . Tonsillectomy     History reviewed. No pertinent family history.  History  Substance Use Topics  . Smoking status: Current Every Day Smoker -- 0.5 packs/day for 39 years    Types: Cigarettes  . Smokeless tobacco: Never Used  . Alcohol Use: Yes     Comment: occasional      Review of Systems  Constitutional: Negative for activity change and appetite change.  HENT: Negative for neck stiffness.   Eyes: Negative for pain.  Respiratory: Negative for chest  tightness and shortness of breath.   Cardiovascular: Negative for chest pain and leg swelling.  Gastrointestinal: Negative for nausea, vomiting, abdominal pain and diarrhea.  Genitourinary: Negative for flank pain.  Musculoskeletal: Positive for back pain.  Skin: Negative for rash.  Neurological: Negative for weakness, numbness and headaches.  Psychiatric/Behavioral: Negative for behavioral problems.    Allergies  Review of patient's allergies indicates no known allergies.  Home Medications   Current Outpatient Rx  Name  Route  Sig  Dispense  Refill  . AMLODIPINE BESYLATE 10 MG PO TABS   Oral   Take 1 tablet (10 mg total) by mouth daily.   30 tablet   0   . BICALUTAMIDE 50 MG PO TABS   Oral   Take 1 tablet (50 mg total) by mouth daily.   30 tablet   11   . VITAMIN D 1000 UNITS PO TABS   Oral   Take 1,000 Units by mouth daily.         Marland Kitchen METOPROLOL TARTRATE 50 MG PO TABS   Oral   Take 1 tablet (50 mg total) by mouth daily.   30 tablet   0   . OXYCODONE-ACETAMINOPHEN 5-325 MG PO TABS   Oral   Take 1 tablet by mouth every 6 (six) hours as needed for pain.   60 tablet   0   . SIMVASTATIN 20 MG PO TABS   Oral   Take 1 tablet (20 mg total)  by mouth daily at 6 PM.   30 tablet   0   . OXYCODONE-ACETAMINOPHEN 10-325 MG PO TABS   Oral   Take 1 tablet by mouth every 4 (four) hours as needed for pain.   30 tablet   0   . SULFAMETHOXAZOLE-TRIMETHOPRIM 800-160 MG PO TABS   Oral   Take 1 tablet by mouth 2 (two) times daily.   14 tablet   0     BP 131/84  Pulse 71  Temp 98.2 F (36.8 C) (Oral)  Resp 18  SpO2 99%  Physical Exam  Constitutional: He appears well-developed and well-nourished.  HENT:  Head: Normocephalic.  Eyes: Pupils are equal, round, and reactive to light.  Cardiovascular: Normal rate and regular rhythm.   Pulmonary/Chest: Effort normal. No respiratory distress.  Musculoskeletal: He exhibits no edema.       Lumbar tenderness without  stepoff or deformity.   Neurological: He is alert.    ED Course  Procedures (including critical care time)  Labs Reviewed  URINALYSIS, ROUTINE W REFLEX MICROSCOPIC - Abnormal; Notable for the following:    Color, Urine AMBER (*)  BIOCHEMICALS MAY BE AFFECTED BY COLOR   APPearance TURBID (*)     Specific Gravity, Urine 1.034 (*)     Hgb urine dipstick LARGE (*)     Bilirubin Urine SMALL (*)     Ketones, ur TRACE (*)     Protein, ur 100 (*)     Leukocytes, UA MODERATE (*)     All other components within normal limits  URINE MICROSCOPIC-ADD ON - Abnormal; Notable for the following:    Bacteria, UA MANY (*)     Crystals CA OXALATE CRYSTALS (*)     All other components within normal limits  URINE CULTURE   Dg Lumbar Spine Complete  02/10/2012  *RADIOLOGY REPORT*  Clinical Data: Low back pain prostate cancer  LUMBAR SPINE - COMPLETE 4+ VIEW  Comparison: 12/16/2011  Findings: Osseous sclerotic metastatic disease is present throughout the pelvis and lumbar spine, similar to the prior study. No fracture is identified.  Mild disc degeneration is unchanged.  IMPRESSION: Sclerotic bony metastatic disease without fracture.  No interval change.   Original Report Authenticated By: Janeece Riggers, M.D.      1. Metastatic cancer   2. Back pain   3. UTI (urinary tract infection)       MDM  Patient with back pain. His Percocet has not been handling the pain of his metastatic cancer. He is also had some urinary frequency without fever or chills. He has apparent UTI. X-ray is stable without new fracture. Patient feels somewhat better after treatment and will likely be discharged home. He will followup with urology. He does have some known hydronephrosis on the right side, but does not appear septic at this time. Urine culture has been sent.        Juliet Rude. Rubin Payor, MD 02/10/12 1535  Juliet Rude. Rubin Payor, MD 02/27/12 [phone]

## 2012-02-10 NOTE — ED Notes (Addendum)
Pt c/o generalized back pain x 1 wk.  Hx of bone/prostate CA.  Dx about a year ago.  Not on any chemo.  States that this is typical of his bone cancer pain.  States that his pain is now constant.

## 2012-02-10 NOTE — ED Notes (Signed)
Pt alert and oriented x4. Respirations even and unlabored, bilateral symmetrical rise and fall of chest. Skin warm and dry. In no acute distress. Denies needs.   

## 2012-02-11 LAB — URINE CULTURE: Culture: NO GROWTH

## 2012-02-13 ENCOUNTER — Other Ambulatory Visit (HOSPITAL_BASED_OUTPATIENT_CLINIC_OR_DEPARTMENT_OTHER): Payer: Self-pay | Admitting: Lab

## 2012-02-13 ENCOUNTER — Ambulatory Visit: Payer: Self-pay

## 2012-02-13 ENCOUNTER — Encounter: Payer: Self-pay | Admitting: Oncology

## 2012-02-13 ENCOUNTER — Telehealth: Payer: Self-pay | Admitting: Oncology

## 2012-02-13 ENCOUNTER — Ambulatory Visit (HOSPITAL_BASED_OUTPATIENT_CLINIC_OR_DEPARTMENT_OTHER): Payer: Self-pay | Admitting: Oncology

## 2012-02-13 VITALS — BP 141/87 | HR 98 | Temp 98.0°F | Resp 20 | Ht 70.0 in | Wt 248.5 lb

## 2012-02-13 DIAGNOSIS — C800 Disseminated malignant neoplasm, unspecified: Secondary | ICD-10-CM

## 2012-02-13 DIAGNOSIS — R972 Elevated prostate specific antigen [PSA]: Secondary | ICD-10-CM

## 2012-02-13 DIAGNOSIS — C61 Malignant neoplasm of prostate: Secondary | ICD-10-CM

## 2012-02-13 DIAGNOSIS — C7951 Secondary malignant neoplasm of bone: Secondary | ICD-10-CM

## 2012-02-13 HISTORY — DX: Elevated prostate specific antigen (PSA): R97.20

## 2012-02-13 LAB — COMPREHENSIVE METABOLIC PANEL (CC13)
AST: 21 U/L (ref 5–34)
Albumin: 4.2 g/dL (ref 3.5–5.0)
Alkaline Phosphatase: 242 U/L — ABNORMAL HIGH (ref 40–150)
BUN: 23 mg/dL (ref 7.0–26.0)
Creatinine: 1.3 mg/dL (ref 0.7–1.3)
Glucose: 126 mg/dl — ABNORMAL HIGH (ref 70–99)
Potassium: 4 mEq/L (ref 3.5–5.1)
Total Bilirubin: 0.41 mg/dL (ref 0.20–1.20)

## 2012-02-13 LAB — CBC WITH DIFFERENTIAL/PLATELET
Basophils Absolute: 0.1 10*3/uL (ref 0.0–0.1)
EOS%: 1.2 % (ref 0.0–7.0)
Eosinophils Absolute: 0.2 10*3/uL (ref 0.0–0.5)
HCT: 33.7 % — ABNORMAL LOW (ref 38.4–49.9)
LYMPH%: 16.3 % (ref 14.0–49.0)
MCH: 28.1 pg (ref 27.2–33.4)
MONO%: 9.8 % (ref 0.0–14.0)
NEUT%: 72.4 % (ref 39.0–75.0)
RDW: 15.7 % — ABNORMAL HIGH (ref 11.0–14.6)
WBC: 19.1 10*3/uL — ABNORMAL HIGH (ref 4.0–10.3)

## 2012-02-13 MED ORDER — BICALUTAMIDE 50 MG PO TABS: 50.0000 mg | ORAL_TABLET | Freq: Every day | ORAL | Status: DC

## 2012-02-13 NOTE — Progress Notes (Signed)
Checked in patient. He still has his old medicaid card. He said the new one is being mailed to him. I advised him we need new card when he comes back the next visit.

## 2012-02-13 NOTE — Progress Notes (Signed)
Note dictated

## 2012-02-13 NOTE — Telephone Encounter (Signed)
appts made and printed for pt aom °

## 2012-02-14 LAB — TESTOSTERONE: Testosterone: 12.28 ng/dL — ABNORMAL LOW (ref 300–890)

## 2012-02-14 NOTE — Progress Notes (Signed)
CC:   Clyda Greener, MD Sigmund I. Patsi Sears, M.D.  REASON FOR CONSULT:  Prostate cancer.  HISTORY OF PRESENT ILLNESS:  Mr. Adelson is a pleasant 56 year old African American gentleman originally from [place], currently of [place].  He is a gentleman with a past medical history significant for hypertension and hyperlipidemia who was diagnosed with prostate cancer dating back to 2012.  At that time, he had presented with back pain and his workup revealed retroperitoneal and pelvic lymphadenopathy, diffuse sclerotic bony lesion, and a PSA that was elevated at 1432.  The patient subsequently underwent a TURP procedure, as well as bilateral orchiectomy that was done on June 12, 2010, under the care of Dr. Patsi Sears.  Case number [id number] pathology showed extensive prostate cancer involvement, Gleason score of 4 + 3 equals 7, and benign testicular tissue was noted.  The patient was also started on Xgeva, again as mentioned, in April 2012.  He had an excellent response to his PSA.  Four months later his PSA was down to 5.04, in March 2013 his PSA was 5.67.  The patient lost his Medicaid card and was not getting Xgeva for a while and his PSA started to rise, on November 20th his PSA was 148.  The patient was hospitalized again between November 4th and November 6th for complaint of back pain and at that time, he had imaging studies including an MRI of the thoracic spine which showed extensive metastatic disease and prominent epidural fat compresses the thecal sac, again suggestive of myelopathy, but really no epidural compression with a tumor.  Of note, his last bone scan was done in March 2012 and showed diffuse osseous metastatic disease and last CT scan from March 2012 showed extensive pelvic adenopathy.  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  As mentioned, he has been on Casodex for less than 2 weeks now and has not reported  any complications.  He does report some hot flashes.  He does report back pain and again, it is manageable with Percocet, takes about 4 of those a day.  He is still able to function and perform most activities of daily living.  His urine stream is adequate.  He is not reporting any hematuria, although 2-3 days ago was prescribed Bactrim for possible urinary tract infection.  His appetite is excellent, performance status is reasonable.  REVIEW OF SYSTEMS:  Does not report any headaches, blurry vision, double vision.  Does not report any motor or sensory neuropathy.  Does not report any alteration in mental status.  Does not report any psychiatric issues, depression.  Does not report any fever, chills, sweats.  Does not report any cough, hemoptysis, hematemesis.  No nausea or vomiting. Does not report any abdominal pain, no hematochezia, no melena.  No frequency, urgency, hesitancy.  Rest of review of system is unremarkable.  PAST MEDICAL HISTORY:  Significant for hypertension, hyperlipidemia.  He is status post TURP and bilateral orchiectomy back in April, 2012. Denied any cardiac disease or any liver or lung disease.  MEDICATIONS:  He is on amlodipine, metoprolol, Percocet, Protonix, simvastatin, and multivitamin supplements.  ALLERGIES:  None.  FAMILY HISTORY:  Denied any history of genitourinary cancers.  No history of prostate cancer.  SOCIAL HISTORY:  He is single.  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  He smoked about a pack a day for 25 years.  Now he smokes about 2-4 cigarettes a day.  Alcohol very minimum  intake at this time.  PHYSICAL EXAMINATION:  General:  Alert, awake gentleman appeared in no active distress today.  Vitals:  Blood pressure is 141/87, pulse 98, respirations 20, temperature is 98.  ECOG performance status is 1. HEENT:  Head is normocephalic, atraumatic.  Pupils equal, round, reactive to light.  Oral mucosa moist and pink.  Neck:   Supple.  No lymphadenopathy.  Heart:  Regular rate and rhythm, S1 and S2.  Lungs: Clear to auscultation.  No rhonchi, wheeze, or dullness to percussion. Abdomen:  Soft, nontender.  No hepatosplenomegaly.  Extremities:  No clubbing, cyanosis, or edema.  Neurologic:  Intact motor, sensory, and deep tendon reflexes.  Skin:  No rashes or lesions.  LABORATORY DATA:  Showed a hemoglobin of 11.1, white cell count 13.5, platelet count 328, that was on 01/16/2012.  His most recent PSA was 148 on November 24.  ASSESSMENT AND PLAN:  This is a 56 year old gentleman with the following issues:  1. Castration-resistant prostate cancer.  He was diagnosed initially     in January 2012, presented with a PSA close to 1400 and back pain.     He underwent a transurethral resection of the prostate procedure     and bilateral orchiectomy.  His Gleason score was 4 + 3 equals 7.     He had an excellent response with the PSA dropping down to a nadir     of 5 back in August 2012.  Most recently he is developing a rise in     his PSA despite castrate level of testosterone.  He had imaging     studies including an MRI of his thoracic spine, which did not show     any epidural compression.  I discussed today the treatment options     with Mr. Rossell pertaining to castration-resistant prostate cancer.     At this time, given the fact that he was started on Casodex less     than 2 weeks ago, I would like to give him the benefit of doubt to     see if there is any benefit to the Casodex at this time with     combined androgen deprivation.  Certainly, if that maneuver does     not work, then second-line hormone manipulation utilizing     ketoconazole and prednisone and Zytiga and possibly systemic     chemotherapy.  The risks and benefits of all these treatments were     discussed today.  I feel he would be a reasonable candidate for all     of them.  I spoke to him about Provenge immunotherapy.  I doubt he     would  be a good candidate for that, as I anticipate that he might     have a rather aggressive tumor and he is already symptomatic from     his disease.  However, I would like to reassess his PSA in exactly     4 weeks and if his PSA has no response on Casodex, I will     discontinue that and proceed with either Zytiga or systemic     chemotherapy. 2. Bony disease.  I do agree with Dr. Patsi Sears to continue with     Rivka Barbara on a monthly basis, and in terms of androgen deprivation,     again, he has already had surgical castration. 3. Pain seems to be under reasonable control at this time with     Percocet.  He might need long-acting  narcotics down the line if his     Percocet use continues.  I have also discussed with the possibility     of using palliative radiation therapy if that pain becomes an     issue.  Also, radiopharmaceuticals are also a possibility down the     line if his disease becomes predominantly bony at that time.  All his questions were answered today.  I will see him back in 1 month and will give him  my final recommendation.    ______________________________ Benjiman Core, M.D. FNS/MEDQ  D:  02/13/2012  T:  02/14/2012  Job:  [id number]

## 2012-02-16 ENCOUNTER — Emergency Department (HOSPITAL_COMMUNITY)
Admission: EM | Admit: 2012-02-16 | Discharge: 2012-02-16 | Disposition: A | Discharge status: Home / Self Care | Payer: Medicaid Other | Attending: Emergency Medicine | Admitting: Emergency Medicine

## 2012-02-16 ENCOUNTER — Encounter (HOSPITAL_COMMUNITY): Payer: Self-pay | Admitting: *Deleted

## 2012-02-16 DIAGNOSIS — Z79899 Other long term (current) drug therapy: Secondary | ICD-10-CM | POA: Insufficient documentation

## 2012-02-16 DIAGNOSIS — Z85830 Personal history of malignant neoplasm of bone: Secondary | ICD-10-CM | POA: Insufficient documentation

## 2012-02-16 DIAGNOSIS — F17200 Nicotine dependence, unspecified, uncomplicated: Secondary | ICD-10-CM | POA: Insufficient documentation

## 2012-02-16 DIAGNOSIS — R3915 Urgency of urination: Secondary | ICD-10-CM | POA: Insufficient documentation

## 2012-02-16 DIAGNOSIS — C799 Secondary malignant neoplasm of unspecified site: Secondary | ICD-10-CM

## 2012-02-16 DIAGNOSIS — I10 Essential (primary) hypertension: Secondary | ICD-10-CM | POA: Insufficient documentation

## 2012-02-16 DIAGNOSIS — C7982 Secondary malignant neoplasm of genital organs: Secondary | ICD-10-CM | POA: Insufficient documentation

## 2012-02-16 DIAGNOSIS — M549 Dorsalgia, unspecified: Secondary | ICD-10-CM

## 2012-02-16 DIAGNOSIS — Z8719 Personal history of other diseases of the digestive system: Secondary | ICD-10-CM | POA: Insufficient documentation

## 2012-02-16 DIAGNOSIS — K5900 Constipation, unspecified: Secondary | ICD-10-CM | POA: Insufficient documentation

## 2012-02-16 DIAGNOSIS — R5381 Other malaise: Secondary | ICD-10-CM | POA: Insufficient documentation

## 2012-02-16 HISTORY — DX: Malignant neoplasm of bone and articular cartilage, unspecified: C41.9

## 2012-02-16 LAB — COMPREHENSIVE METABOLIC PANEL
Alkaline Phosphatase: 242 U/L — ABNORMAL HIGH (ref 39–117)
BUN: 14 mg/dL (ref 6–23)
Chloride: 93 mEq/L — ABNORMAL LOW (ref 96–112)
Creatinine, Ser: 0.73 mg/dL (ref 0.50–1.35)
GFR calc Af Amer: >90 mL/min (ref >90)
Glucose, Bld: 136 mg/dL — ABNORMAL HIGH (ref 70–99)
Potassium: 3.6 mEq/L (ref 3.5–5.1)
Total Bilirubin: 0.3 mg/dL (ref 0.3–1.2)
Total Protein: 8.7 g/dL — ABNORMAL HIGH (ref 6.0–8.3)

## 2012-02-16 LAB — HEPATIC FUNCTION PANEL
ALT: 22 U/L (ref 0–53)
Alkaline Phosphatase: 224 U/L — ABNORMAL HIGH (ref 39–117)
Bilirubin, Direct: <0.1 mg/dL (ref 0.0–0.3)

## 2012-02-16 LAB — CBC WITH DIFFERENTIAL/PLATELET
Basophils Relative: 0 % (ref 0–1)
Eosinophils Absolute: 0.1 10*3/uL (ref 0.0–0.7)
HCT: 32 % — ABNORMAL LOW (ref 39.0–52.0)
Hemoglobin: 10.8 g/dL — ABNORMAL LOW (ref 13.0–17.0)
Lymphs Abs: 2.5 10*3/uL (ref 0.7–4.0)
MCH: 29 pg (ref 26.0–34.0)
MCHC: 33.8 g/dL (ref 30.0–36.0)
MCV: 85.8 fL (ref 78.0–100.0)
Monocytes Absolute: 1.6 10*3/uL — ABNORMAL HIGH (ref 0.1–1.0)
Monocytes Relative: 10 % (ref 3–12)
Neutrophils Relative %: 75 % (ref 43–77)
RBC: 3.73 MIL/uL — ABNORMAL LOW (ref 4.22–5.81)

## 2012-02-16 LAB — PROTIME-INR: Prothrombin Time: 14 seconds (ref 11.6–15.2)

## 2012-02-16 LAB — ACETAMINOPHEN LEVEL
Acetaminophen (Tylenol), Serum: <15 ug/mL (ref 10–30)
Acetaminophen (Tylenol), Serum: <15 ug/mL (ref 10–30)

## 2012-02-16 MED ORDER — OXYCODONE HCL 5 MG PO TABS: 5.0000 mg | ORAL_TABLET | ORAL | Status: DC | PRN

## 2012-02-16 MED ORDER — HYDROMORPHONE HCL 4 MG PO TABS: 4.0000 mg | ORAL_TABLET | ORAL | Status: DC | PRN

## 2012-02-16 MED ORDER — HYDROMORPHONE HCL PF 1 MG/ML IJ SOLN
1.0000 mg | Freq: Once | INTRAMUSCULAR | Status: AC
  Administered 2012-02-16: 1 mg via INTRAVENOUS
  Filled 2012-02-16: qty 1

## 2012-02-16 NOTE — ED Notes (Signed)
Discharge instructions reviewed w/ pt., verbalizes understanding. One prescription provided at discharge. 

## 2012-02-16 NOTE — ED Provider Notes (Signed)
History     CSN: [id number]  Arrival date & time 02/16/12  1213   First MD Initiated Contact with Patient 02/16/12 1229      Chief Complaint  Patient presents with  . Back Pain    (Consider location/radiation/quality/duration/timing/severity/associated sxs/prior treatment) HPI Comments: Patient with hx metastatic prostate cancer with mets of his spine presents with uncontrolled low back pain.  Pt has recently been seen in ED and by Dr Clelia Croft with same.  States his pain was controlled with percocet until 3 days ago, now states he is taking 1 percocet every hour and continues to have uncontrolled pain.  Pain is described as squeezing, "pulling on my nerves," 9/10 intensity, with radiation into right ankle.  Reports intermittent tingling and weakness of his right leg x months.  Denies fevers, urinary or bowel incontinence or retention, fevers, chills, myalgias.  Pt has recently been diagnosed with UTI, reports urinary urgency, denies dysuria or frequency.  He has been taking bactrim but skipped his dose yesterday because his pain was uncontrolled.    Patient is a 56 y.o. male presenting with back pain. The history is provided by the patient and medical records.  Back Pain  Associated symptoms include weakness. Pertinent negatives include no chest pain, no fever, no numbness and no dysuria.    Past Medical History  Diagnosis Date  . Hypertension   . Diverticulosis     Noted on CT abdomen (06/2010)  . 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 Patsi Sears (06/2010) // CT abdomen and pelvis  (06/2010) revealed signficant retroperitoneal and pelvic adenopathy // Bone scan (06/2010) - showed diffuse osseous abnormality  . Bone cancer     Past Surgical History  Procedure Date  . Testicle removed   . Tonsillectomy     No family history on file.  History  Substance Use Topics  . Smoking  status: Current Every Day Smoker -- 0.5 packs/day for 39 years    Types: Cigarettes  . Smokeless tobacco: Never Used  . Alcohol Use: Yes     Comment: occasional      Review of Systems  Constitutional: Negative for fever and chills.  Respiratory: Negative for cough and shortness of breath.   Cardiovascular: Negative for chest pain.  Gastrointestinal: Positive for constipation. Negative for nausea, vomiting and diarrhea.  Genitourinary: Positive for urgency. Negative for dysuria and frequency.  Musculoskeletal: Positive for back pain. Negative for myalgias.  Neurological: Positive for weakness. Negative for numbness.    Allergies  Review of patient's allergies indicates no known allergies.  Home Medications   Current Outpatient Rx  Name  Route  Sig  Dispense  Refill  . AMLODIPINE BESYLATE 10 MG PO TABS   Oral   Take 1 tablet (10 mg total) by mouth daily.   30 tablet   0   . BICALUTAMIDE 50 MG PO TABS   Oral   Take 1 tablet (50 mg total) by mouth daily.   30 tablet   0   . VITAMIN D 1000 UNITS PO TABS   Oral   Take 1,000 Units by mouth daily.         Marland Kitchen METOPROLOL TARTRATE 50 MG PO TABS   Oral   Take 1 tablet (50 mg total) by mouth daily.   30 tablet   0   . OXYCODONE-ACETAMINOPHEN 10-325 MG PO TABS   Oral  Take 1 tablet by mouth every 4 (four) hours as needed for pain.   30 tablet   0   . OXYCODONE-ACETAMINOPHEN 5-325 MG PO TABS   Oral   Take 1 tablet by mouth every 6 (six) hours as needed for pain.   60 tablet   0   . SIMVASTATIN 20 MG PO TABS   Oral   Take 1 tablet (20 mg total) by mouth daily at 6 PM.   30 tablet   0   . SULFAMETHOXAZOLE-TRIMETHOPRIM 800-160 MG PO TABS   Oral   Take 1 tablet by mouth 2 (two) times daily.   14 tablet   0     BP 167/95  Pulse 99  Temp 98.3 F (36.8 C) (Oral)  Resp 24  SpO2 97%  Physical Exam  Nursing note and vitals reviewed. Constitutional: He appears well-developed and well-nourished. No distress.   HENT:  Head: Normocephalic and atraumatic.  Neck: Neck supple.  Cardiovascular: Normal rate and regular rhythm.   Pulmonary/Chest: Effort normal and breath sounds normal. No respiratory distress. He has no wheezes. He has no rales.  Abdominal: Soft. He exhibits no distension and no mass. There is no tenderness. There is no rebound and no guarding.  Musculoskeletal:       Lower extremities:  Strength 5/5, sensation intact, distal pulses intact.    Spine is nontender.  No crepitus, no stepoffs, no skin changes.   Neurological: He is alert. He exhibits normal muscle tone.  Skin: He is not diaphoretic.    ED Course  Procedures (including critical care time)  Labs Reviewed  CBC WITH DIFFERENTIAL - Abnormal; Notable for the following:    WBC 16.3 (*)     RBC 3.73 (*)     Hemoglobin 10.8 (*)     HCT 32.0 (*)     Neutro Abs 12.2 (*)     Monocytes Absolute 1.6 (*)     All other components within normal limits  COMPREHENSIVE METABOLIC PANEL - Abnormal; Notable for the following:    Sodium 132 (*)     Chloride 93 (*)     Glucose, Bld 136 (*)     Total Protein 8.7 (*)     Alkaline Phosphatase 242 (*)     All other components within normal limits  APTT - Abnormal; Notable for the following:    aPTT 48 (*)     All other components within normal limits  HEPATIC FUNCTION PANEL - Abnormal; Notable for the following:    Alkaline Phosphatase 224 (*)     All other components within normal limits  ACETAMINOPHEN LEVEL  PROTIME-INR  ACETAMINOPHEN LEVEL   No results found.  Discussed patient with Dr Patria Mane.   2:31 PM Pt reports his pain did decrease to 4/10, now 6/10.  Requests more pain medication.  Discussed percocet and tylenol usage.  States he took 15 percocet (10-325) and 3 extra strength tylenol yesterday, today has taken 10 percocet (10-325).  Last dose approximately 30 minutes prior to arrival.  2:58 PM I spoke with [facility] regarding the patient's ingestion.  Plan  is for recheck of APAP level and LFTs 4 hours after last ingestion and then discuss again.    5:12 PM Discussed with poison center.  AST/ALT, acetaminophen levels remain normal.  They recommend medical clearance for d/c home from "overdose" perspective.     1. Back pain   2. Metastatic cancer     MDM  Pt with metastatic prostate  cancer with known mets to spine p/w uncontrolled pain.  Pt reported he was taking more than the prescribed amount of percocet and additional tylenol without any relief.  Pt had recent xray of lumbar spine for same complaint showing metastatic disease.  Pt does not have any neurological deficits on exam, no current indication for further imaging.  Patient's pain controlled in ED with IV dilaudid.  Given concern of acetaminophen overdose from patient taking medications improperly, poison control was contacted and LFTs and APAP levels checked and rechecked.  No indications for treatment for overdose given negative APAP and normal LFTs.  Discussed medication safety with patient and family member and counseled regarding the dangers of APAP overdose.  Pt advised to follow closely with Dr Clelia Croft to discuss a better pain regimen.  Per Dr Alver Fisher notes, they anticipate needing to increase patient's pain regimen and may consider radiation as well.  Discussed all results, treatment plan, and follow up with patient and family member.  Pt given return precautions.  Pt verbalizes understanding and agrees with plan.          [place], [place] 02/16/12 [phone]

## 2012-02-16 NOTE — ED Notes (Addendum)
Pt states he has had low back pain radiating into right hip and leg x3 days.  Pt has hx of same and has prescription for percocet, which he states he has taken with no relief.  Pt has hx of bone cancer and sees Dr. Clelia Croft.  Pt has been to ED with c/o back pain in recent past.  Pt denies fever and N/V.

## 2012-02-17 NOTE — ED Provider Notes (Signed)
Medical screening examination/treatment/procedure(s) were performed by non-physician practitioner and as supervising physician I was immediately available for consultation/collaboration.   Lyanne Co, MD 02/17/12 2132

## 2012-02-19 ENCOUNTER — Encounter (HOSPITAL_COMMUNITY): Payer: Self-pay | Admitting: *Deleted

## 2012-02-19 ENCOUNTER — Emergency Department (HOSPITAL_COMMUNITY)
Admission: EM | Admit: 2012-02-19 | Discharge: 2012-02-19 | Disposition: A | Discharge status: Home / Self Care | Payer: Medicaid Other | Attending: Emergency Medicine | Admitting: Emergency Medicine

## 2012-02-19 ENCOUNTER — Telehealth: Payer: Self-pay | Admitting: Oncology

## 2012-02-19 ENCOUNTER — Other Ambulatory Visit: Payer: Self-pay | Admitting: Oncology

## 2012-02-19 DIAGNOSIS — G893 Neoplasm related pain (acute) (chronic): Secondary | ICD-10-CM

## 2012-02-19 DIAGNOSIS — M545 Low back pain, unspecified: Secondary | ICD-10-CM | POA: Insufficient documentation

## 2012-02-19 DIAGNOSIS — C61 Malignant neoplasm of prostate: Secondary | ICD-10-CM

## 2012-02-19 DIAGNOSIS — C7951 Secondary malignant neoplasm of bone: Secondary | ICD-10-CM | POA: Insufficient documentation

## 2012-02-19 DIAGNOSIS — C419 Malignant neoplasm of bone and articular cartilage, unspecified: Secondary | ICD-10-CM | POA: Insufficient documentation

## 2012-02-19 DIAGNOSIS — C7952 Secondary malignant neoplasm of bone marrow: Secondary | ICD-10-CM | POA: Insufficient documentation

## 2012-02-19 DIAGNOSIS — Z8546 Personal history of malignant neoplasm of prostate: Secondary | ICD-10-CM | POA: Insufficient documentation

## 2012-02-19 DIAGNOSIS — Z79899 Other long term (current) drug therapy: Secondary | ICD-10-CM | POA: Insufficient documentation

## 2012-02-19 DIAGNOSIS — I10 Essential (primary) hypertension: Secondary | ICD-10-CM | POA: Insufficient documentation

## 2012-02-19 DIAGNOSIS — Z8719 Personal history of other diseases of the digestive system: Secondary | ICD-10-CM | POA: Insufficient documentation

## 2012-02-19 DIAGNOSIS — F17200 Nicotine dependence, unspecified, uncomplicated: Secondary | ICD-10-CM | POA: Insufficient documentation

## 2012-02-19 HISTORY — DX: Hyperlipidemia, unspecified: E78.5

## 2012-02-19 HISTORY — DX: Elevated prostate specific antigen (PSA): R97.20

## 2012-02-19 MED ORDER — OXYCODONE HCL 5 MG PO TABS: ORAL_TABLET | ORAL | Status: DC

## 2012-02-19 MED ORDER — HYDROMORPHONE HCL PF 2 MG/ML IJ SOLN
2.0000 mg | Freq: Once | INTRAMUSCULAR | Status: AC
  Administered 2012-02-19: 2 mg via INTRAMUSCULAR
  Filled 2012-02-19: qty 1

## 2012-02-19 MED ORDER — LORAZEPAM 2 MG/ML IJ SOLN
1.0000 mg | Freq: Once | INTRAMUSCULAR | Status: AC
  Administered 2012-02-19: 1 mg via INTRAMUSCULAR
  Filled 2012-02-19: qty 1

## 2012-02-19 MED ORDER — HYDROMORPHONE HCL 4 MG PO TABS: 4.0000 mg | ORAL_TABLET | ORAL | Status: DC | PRN

## 2012-02-19 NOTE — ED Notes (Signed)
Pt reports back pain x 3 weeks. Hx of bone cancer, states pain is getting worse. Pt taking hydromorphone for pain without relief. No known injury to back.

## 2012-02-19 NOTE — ED Provider Notes (Signed)
Medical screening examination/treatment/procedure(s) were performed by non-physician practitioner and as supervising physician I was immediately available for consultation/collaboration.   Gavin Pound. Sibbie Flammia, MD 02/19/12 1014

## 2012-02-19 NOTE — Telephone Encounter (Signed)
S.W. pt and advised him on the 12.11.13 appt @ 2:30pm...pt aware

## 2012-02-19 NOTE — ED Provider Notes (Signed)
History     CSN: [id number]  Arrival date & time 02/19/12  0630   None     Chief Complaint  Patient presents with  . Back Pain    (Consider location/radiation/quality/duration/timing/severity/associated sxs/prior treatment) Patient is a 56 y.o. male presenting with back pain. The history is provided by the patient. No language interpreter was used.  Back Pain  This is a new problem. Episode onset: 3 weeks. The problem occurs constantly. The problem has been gradually worsening. The pain is associated with no known injury. The pain is present in the thoracic spine and lumbar spine. The quality of the pain is described as aching. The pain does not radiate. The pain is at a severity of 10/10. The pain is severe. The pain is the same all the time. Stiffness is present all day. He has tried analgesics for the symptoms. The treatment provided significant relief.  Pt reports no relief with Dilaudid po this am.   Pt follows with Dr. Clelia Croft.  Past Medical History  Diagnosis Date  . Hypertension   . Diverticulosis     Noted on CT abdomen (06/2010)  . 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 Patsi Sears (06/2010) // CT abdomen and pelvis  (06/2010) revealed signficant retroperitoneal and pelvic adenopathy // Bone scan (06/2010) - showed diffuse osseous abnormality  . Bone cancer     Past Surgical History  Procedure Date  . Testicle removed   . Tonsillectomy     No family history on file.  History  Substance Use Topics  . Smoking status: Current Every Day Smoker -- 0.5 packs/day for 39 years    Types: Cigarettes  . Smokeless tobacco: Never Used  . Alcohol Use: Yes     Comment: occasional      Review of Systems  Musculoskeletal: Positive for back pain.  All other systems reviewed and are negative.    Allergies  Review of patient's allergies indicates no known  allergies.  Home Medications   Current Outpatient Rx  Name  Route  Sig  Dispense  Refill  . ACETAMINOPHEN 325 MG PO TABS   Oral   Take 650 mg by mouth every 6 (six) hours as needed. Pain         . AMLODIPINE BESYLATE 10 MG PO TABS   Oral   Take 1 tablet (10 mg total) by mouth daily.   30 tablet   0   . BICALUTAMIDE 50 MG PO TABS   Oral   Take 50 mg by mouth daily.         Marland Kitchen VITAMIN D 1000 UNITS PO TABS   Oral   Take 1,000 Units by mouth daily.         Marland Kitchen HYDROMORPHONE HCL 4 MG PO TABS   Oral   Take 1 tablet (4 mg total) by mouth every 4 (four) hours as needed for pain.   20 tablet   0   . METOPROLOL TARTRATE 50 MG PO TABS   Oral   Take 1 tablet (50 mg total) by mouth daily.   30 tablet   0   . SIMVASTATIN 20 MG PO TABS   Oral   Take 1 tablet (20 mg total) by mouth daily at 6 PM.   30 tablet   0   . SULFAMETHOXAZOLE-TRIMETHOPRIM 800-160 MG PO TABS   Oral   Take 1 tablet by mouth  2 (two) times daily.           BP 144/88  Pulse 112  Temp 98.2 F (36.8 C) (Oral)  Resp 20  SpO2 99%  Physical Exam  Nursing note and vitals reviewed. Constitutional: He appears well-developed and well-nourished.  HENT:  Head: Normocephalic and atraumatic.  Right Ear: External ear normal.  Left Ear: External ear normal.  Nose: Nose normal.  Mouth/Throat: Oropharynx is clear and moist.  Eyes: Conjunctivae normal are normal. Pupils are equal, round, and reactive to light.  Neck: Normal range of motion.  Cardiovascular: Normal rate, regular rhythm and normal heart sounds.   Pulmonary/Chest: Effort normal and breath sounds normal.  Abdominal: Soft.  Musculoskeletal:       Tender thoracic and lumbar spine  Neurological: He is alert.  Skin: Skin is warm.  Psychiatric: He has a normal mood and affect.    ED Course  Procedures (including critical care time)  Labs Reviewed - No data to display No results found.   1. Cancer-related pain       MDM   I spoke  to Dr. Clelia Croft.   I will continue dilaudid.  Pt given oxycodone for pain break through pain      Lonia Skinner [place], [place] 02/19/12 [address] [place], [place] 02/19/12 1008

## 2012-02-20 ENCOUNTER — Encounter: Payer: Self-pay | Admitting: *Deleted

## 2012-02-20 ENCOUNTER — Ambulatory Visit
Admit: 2012-02-20 | Discharge: 2012-02-20 | Disposition: A | Discharge status: Home / Self Care | Payer: Medicaid Other | Attending: Radiation Oncology | Admitting: Radiation Oncology

## 2012-02-20 VITALS — BP 120/82 | HR 80 | Temp 98.1°F | Resp 20 | Ht 70.0 in | Wt 248.9 lb

## 2012-02-20 VITALS — BP 120/82

## 2012-02-20 DIAGNOSIS — C419 Malignant neoplasm of bone and articular cartilage, unspecified: Secondary | ICD-10-CM

## 2012-02-20 DIAGNOSIS — C7952 Secondary malignant neoplasm of bone marrow: Secondary | ICD-10-CM

## 2012-02-20 DIAGNOSIS — C7951 Secondary malignant neoplasm of bone: Secondary | ICD-10-CM | POA: Insufficient documentation

## 2012-02-20 DIAGNOSIS — C61 Malignant neoplasm of prostate: Secondary | ICD-10-CM | POA: Insufficient documentation

## 2012-02-20 HISTORY — DX: Anxiety disorder, unspecified: F41.9

## 2012-02-20 NOTE — Progress Notes (Signed)
Radiation Oncology         ([phone]) [phone] ________________________________  Name: Stephen Stokes MRN: [id number]  Date: 02/20/2012  DOB: 1955/10/22  WU:[id number],YNWGN VINCENT, MD  Benjiman Core, MD     REFERRING PHYSICIAN: Benjiman Core, MD   DIAGNOSIS: The encounter diagnosis was Bone cancer.: Metastatic prostate cancer   HISTORY OF PRESENT ILLNESS::Stephen Stokes is a 56 y.o. male who is seen for an initial consultation visit. The patient has a history of metastatic prostate cancer. He was diagnosed initially back in 2012 and was found to have a PSA level greater than 1400 at that time. The patient proceeded to undergo a TURP procedure as well as bilateral orchiectomy. Pathology returned positive for adenocarcinoma, Gleason score 7. The patient was started on Xgeva.  The patient's PSA did markedly decreased down to approximately a level of 5 several months later. However the patient's PSA level has risen some since that time. The patient has also been complaining of some worsening back pain. He did have an MRI scan of the thoracic spine on 01/15/2012. There is diffuse metastatic disease seen throughout the visualized bony anatomy. There is also was felt to be very prominent fat in the spinal canal extending from T4-8 with some compression of the thecal sac.` He is also had x-rays of the lumbar spine recently on 02/10/2012. There was sclerotic bony metastatic disease without fracture with no significant interval change versus a couple of months before.  Given the patient's diagnosis of metastatic cancer and pain in the back region, I been consult in to evaluate the patient for palliative radiotherapy. Today the patient complains of significant low back pain. He states that this really has been significantly increasing over the last week. He does have to prescriptions today for narcotic pain medicine. He states that this pain has caused some trouble walking and he does have pain extending/radiating  to the right lab. He describes some occasional numbness in the right lower extremity as well.     PREVIOUS RADIATION THERAPY: No   PAST MEDICAL HISTORY:  has a past medical history of Hypertension; Diverticulosis; Hyperlipidemia; Elevated PSA (02/13/12); Bone cancer; Prostate cancer (03/2010); Hot flashes; and Anxiety.     PAST SURGICAL HISTORY: Past Surgical History  Procedure Date  . Orchiectomy 06/2010    bilateral  . Tonsillectomy   . Transurethral resection of prostate 06/2010    gleason 4+3=7     FAMILY HISTORY: family history is not on file.   SOCIAL HISTORY:  reports that he has been smoking Cigarettes.  He has a 19.5 pack-year smoking history. He has never used smokeless tobacco. He reports that he drinks alcohol. He reports that he does not use illicit drugs.   ALLERGIES: Review of patient's allergies indicates no known allergies.   MEDICATIONS:  Current Outpatient Prescriptions  Medication Sig Dispense Refill  . amLODipine (NORVASC) 10 MG tablet Take 1 tablet (10 mg total) by mouth daily.  30 tablet  0  . bicalutamide (CASODEX) 50 MG tablet Take 50 mg by mouth daily.      . cholecalciferol (VITAMIN D) 1000 UNITS tablet Take 1,000 Units by mouth daily.      Marland Kitchen HYDROmorphone (DILAUDID) 4 MG tablet Take 1 tablet (4 mg total) by mouth every 4 (four) hours as needed for pain.  20 tablet  0  . metoprolol (LOPRESSOR) 50 MG tablet Take 1 tablet (50 mg total) by mouth daily.  30 tablet  0  . oxyCODONE (ROXICODONE) 5 MG immediate  release tablet Take one-two tablets q4-6 prn pain  30 tablet  0  . simvastatin (ZOCOR) 20 MG tablet Take 1 tablet (20 mg total) by mouth daily at 6 PM.  30 tablet  0  . sulfamethoxazole-trimethoprim (BACTRIM DS,SEPTRA DS) 800-160 MG per tablet Take 1 tablet by mouth 2 (two) times daily.         REVIEW OF SYSTEMS:  A 15 point review of systems is documented in the electronic medical record. This was obtained by the nursing staff. However, I reviewed  this with the patient to discuss relevant findings and make appropriate changes.  Pertinent items are noted in HPI.    PHYSICAL EXAM:  blood pressure is 120/82.   General: Well-developed, in no acute distress HEENT: Normocephalic, atraumatic; oral cavity clear Neck: Supple without any lymphadenopathy Cardiovascular: Regular rate and rhythm Respiratory: Clear to auscultation bilaterally GI: Soft, nontender, normal bowel sounds Extremities: No edema present Neuro: No focal deficits - 5 out of 5 strength in the lower extremities bilaterally     LABORATORY DATA:  Lab Results  Component Value Date   WBC 16.3* 02/16/2012   HGB 10.8* 02/16/2012   HCT 32.0* 02/16/2012   MCV 85.8 02/16/2012   PLT 302 02/16/2012   Lab Results  Component Value Date   NA 132* 02/16/2012   K 3.6 02/16/2012   CL 93* 02/16/2012   CO2 24 02/16/2012   Lab Results  Component Value Date   ALT 22 02/16/2012   AST 17 02/16/2012   ALKPHOS 224* 02/16/2012   BILITOT 0.3 02/16/2012      RADIOGRAPHY: Dg Lumbar Spine Complete  02/10/2012  *RADIOLOGY REPORT*  Clinical Data: Low back pain prostate cancer  LUMBAR SPINE - COMPLETE 4+ VIEW  Comparison: 12/16/2011  Findings: Osseous sclerotic metastatic disease is present throughout the pelvis and lumbar spine, similar to the prior study. No fracture is identified.  Mild disc degeneration is unchanged.  IMPRESSION: Sclerotic bony metastatic disease without fracture.  No interval change.   Original Report Authenticated By: Janeece Riggers, M.D.        IMPRESSION: The patient is a 56 year old male with metastatic prostate cancer. He has severe low back pain at this time. The patient has been seen by Dr. Clelia Croft and they are going to make a final decision on systemic treatment in the near future.   PLAN: I would like to proceed with an MRI scan of the lumbar spine. I do not see a clear radiation target currently on his imaging. The patient's description of pain today is that in the  lumbar region. It sounds like his pain has really been worsening recently and he does have some radiation of pain to the right lower extremity and a description of some occasional numbness in this location as well.   If there is a clear target for palliative radiotherapy, then we will schedule and proceed with a simulation which the patient was agreeable to. I discussed a potential 2-3 week course of palliative radiation for this setting. If there is not he could target them we will need to reevaluate how he is doing at that point. Patient in addition to external beam treatment is also a potential candidate for alternative modalities such as Xofigo.   I spent 40 minutes minutes face to face with the patient and more than 50% of that time was spent in counseling and/or coordination of care.    ________________________________   Radene Gunning, MD, PhD

## 2012-02-20 NOTE — Addendum Note (Signed)
Encounter addended by: Lowella Petties, RN on: 02/20/2012  2:57 PM<BR>     Documentation filed: Chief Complaint Section, Vitals Section, Inpatient Patient Education, Inpatient Document Flowsheet

## 2012-02-20 NOTE — Addendum Note (Signed)
Encounter addended by: Laketa Sandoz Mintz Arles Rumbold, RN on: 02/20/2012  7:53 PM<BR>     Documentation filed: Charges VN

## 2012-02-20 NOTE — Progress Notes (Signed)
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

## 2012-02-20 NOTE — Progress Notes (Signed)
Please see the Nurse Progress Note in the MD Initial Consult Encounter for this patient. 

## 2012-02-21 ENCOUNTER — Telehealth: Payer: Self-pay | Admitting: *Deleted

## 2012-02-21 NOTE — Telephone Encounter (Signed)
CALLED PATIENT TO INFORM OF TEST, LVM FOR A RETURN CALL 

## 2012-02-27 ENCOUNTER — Other Ambulatory Visit: Payer: Self-pay | Admitting: Radiation Oncology

## 2012-02-27 ENCOUNTER — Ambulatory Visit (HOSPITAL_COMMUNITY)
Admission: RE | Admit: 2012-02-27 | Discharge: 2012-02-27 | Disposition: A | Discharge status: Home / Self Care | Payer: Medicaid Other | Source: Ambulatory Visit | Attending: Radiation Oncology | Admitting: Radiation Oncology

## 2012-02-27 DIAGNOSIS — C61 Malignant neoplasm of prostate: Secondary | ICD-10-CM | POA: Insufficient documentation

## 2012-02-27 DIAGNOSIS — C7952 Secondary malignant neoplasm of bone marrow: Secondary | ICD-10-CM | POA: Insufficient documentation

## 2012-02-27 DIAGNOSIS — I714 Abdominal aortic aneurysm, without rupture, unspecified: Secondary | ICD-10-CM | POA: Insufficient documentation

## 2012-02-27 DIAGNOSIS — M5137 Other intervertebral disc degeneration, lumbosacral region: Secondary | ICD-10-CM | POA: Insufficient documentation

## 2012-02-27 DIAGNOSIS — M545 Low back pain, unspecified: Secondary | ICD-10-CM | POA: Insufficient documentation

## 2012-02-27 DIAGNOSIS — M51379 Other intervertebral disc degeneration, lumbosacral region without mention of lumbar back pain or lower extremity pain: Secondary | ICD-10-CM | POA: Insufficient documentation

## 2012-02-27 DIAGNOSIS — C7951 Secondary malignant neoplasm of bone: Secondary | ICD-10-CM | POA: Insufficient documentation

## 2012-02-27 DIAGNOSIS — M8448XA Pathological fracture, other site, initial encounter for fracture: Secondary | ICD-10-CM | POA: Insufficient documentation

## 2012-02-27 DIAGNOSIS — D1779 Benign lipomatous neoplasm of other sites: Secondary | ICD-10-CM | POA: Insufficient documentation

## 2012-02-27 MED ORDER — GADOBENATE DIMEGLUMINE 529 MG/ML IV SOLN
20.0000 mL | Freq: Once | INTRAVENOUS | Status: AC | PRN
  Administered 2012-02-27: 20 mL via INTRAVENOUS

## 2012-02-28 ENCOUNTER — Emergency Department (HOSPITAL_COMMUNITY): Payer: Medicaid Other

## 2012-02-28 ENCOUNTER — Inpatient Hospital Stay (HOSPITAL_COMMUNITY)
Admission: EM | Admit: 2012-02-28 | Discharge: 2012-03-01 | DRG: 389 | Disposition: A | Discharge status: Home / Self Care | Payer: Medicaid Other | Attending: Internal Medicine | Admitting: Internal Medicine

## 2012-02-28 ENCOUNTER — Encounter (HOSPITAL_COMMUNITY): Payer: Self-pay | Admitting: Emergency Medicine

## 2012-02-28 DIAGNOSIS — N200 Calculus of kidney: Secondary | ICD-10-CM | POA: Diagnosis present

## 2012-02-28 DIAGNOSIS — C419 Malignant neoplasm of bone and articular cartilage, unspecified: Secondary | ICD-10-CM

## 2012-02-28 DIAGNOSIS — F411 Generalized anxiety disorder: Secondary | ICD-10-CM | POA: Diagnosis present

## 2012-02-28 DIAGNOSIS — E785 Hyperlipidemia, unspecified: Secondary | ICD-10-CM | POA: Diagnosis present

## 2012-02-28 DIAGNOSIS — J90 Pleural effusion, not elsewhere classified: Secondary | ICD-10-CM | POA: Diagnosis present

## 2012-02-28 DIAGNOSIS — R0789 Other chest pain: Secondary | ICD-10-CM

## 2012-02-28 DIAGNOSIS — Z9079 Acquired absence of other genital organ(s): Secondary | ICD-10-CM

## 2012-02-28 DIAGNOSIS — T4275XA Adverse effect of unspecified antiepileptic and sedative-hypnotic drugs, initial encounter: Secondary | ICD-10-CM | POA: Diagnosis present

## 2012-02-28 DIAGNOSIS — R310 Gross hematuria: Secondary | ICD-10-CM

## 2012-02-28 DIAGNOSIS — K5730 Diverticulosis of large intestine without perforation or abscess without bleeding: Secondary | ICD-10-CM | POA: Diagnosis present

## 2012-02-28 DIAGNOSIS — R0602 Shortness of breath: Secondary | ICD-10-CM

## 2012-02-28 DIAGNOSIS — R109 Unspecified abdominal pain: Secondary | ICD-10-CM

## 2012-02-28 DIAGNOSIS — M549 Dorsalgia, unspecified: Secondary | ICD-10-CM

## 2012-02-28 DIAGNOSIS — K560 Paralytic ileus: Principal | ICD-10-CM | POA: Diagnosis present

## 2012-02-28 DIAGNOSIS — I10 Essential (primary) hypertension: Secondary | ICD-10-CM | POA: Diagnosis present

## 2012-02-28 DIAGNOSIS — F17200 Nicotine dependence, unspecified, uncomplicated: Secondary | ICD-10-CM | POA: Diagnosis present

## 2012-02-28 DIAGNOSIS — K5790 Diverticulosis of intestine, part unspecified, without perforation or abscess without bleeding: Secondary | ICD-10-CM

## 2012-02-28 DIAGNOSIS — E871 Hypo-osmolality and hyponatremia: Secondary | ICD-10-CM | POA: Diagnosis present

## 2012-02-28 DIAGNOSIS — N390 Urinary tract infection, site not specified: Secondary | ICD-10-CM | POA: Diagnosis present

## 2012-02-28 DIAGNOSIS — G893 Neoplasm related pain (acute) (chronic): Secondary | ICD-10-CM

## 2012-02-28 DIAGNOSIS — C7952 Secondary malignant neoplasm of bone marrow: Secondary | ICD-10-CM

## 2012-02-28 DIAGNOSIS — Z720 Tobacco use: Secondary | ICD-10-CM

## 2012-02-28 DIAGNOSIS — C7951 Secondary malignant neoplasm of bone: Secondary | ICD-10-CM | POA: Diagnosis present

## 2012-02-28 DIAGNOSIS — C61 Malignant neoplasm of prostate: Secondary | ICD-10-CM | POA: Diagnosis present

## 2012-02-28 DIAGNOSIS — N1330 Unspecified hydronephrosis: Secondary | ICD-10-CM

## 2012-02-28 DIAGNOSIS — D649 Anemia, unspecified: Secondary | ICD-10-CM

## 2012-02-28 DIAGNOSIS — Z66 Do not resuscitate: Secondary | ICD-10-CM | POA: Diagnosis present

## 2012-02-28 LAB — CBC
HCT: 28.5 % — ABNORMAL LOW (ref 39.0–52.0)
Hemoglobin: 9 g/dL — ABNORMAL LOW (ref 13.0–17.0)
MCH: 26.9 pg (ref 26.0–34.0)
MCHC: 31.6 g/dL (ref 30.0–36.0)
MCV: 85.3 fL (ref 78.0–100.0)
Platelets: 246 10*3/uL (ref 150–400)
RBC: 3.34 MIL/uL — ABNORMAL LOW (ref 4.22–5.81)
RDW: 16.4 % — ABNORMAL HIGH (ref 11.5–15.5)
WBC: 15.3 10*3/uL — ABNORMAL HIGH (ref 4.0–10.5)

## 2012-02-28 LAB — COMPREHENSIVE METABOLIC PANEL
ALT: 128 U/L — ABNORMAL HIGH (ref 0–53)
AST: 63 U/L — ABNORMAL HIGH (ref 0–37)
Albumin: 3 g/dL — ABNORMAL LOW (ref 3.5–5.2)
Alkaline Phosphatase: 369 U/L — ABNORMAL HIGH (ref 39–117)
BUN: 13 mg/dL (ref 6–23)
CO2: 22 mEq/L (ref 19–32)
Calcium: 8.6 mg/dL (ref 8.4–10.5)
Chloride: 93 mEq/L — ABNORMAL LOW (ref 96–112)
Creatinine, Ser: 0.68 mg/dL (ref 0.50–1.35)
GFR calc Af Amer: >90 mL/min (ref >90)
GFR calc non Af Amer: >90 mL/min (ref >90)
Glucose, Bld: 140 mg/dL — ABNORMAL HIGH (ref 70–99)
Potassium: 3.8 mEq/L (ref 3.5–5.1)
Sodium: 130 mEq/L — ABNORMAL LOW (ref 135–145)
Total Bilirubin: 0.6 mg/dL (ref 0.3–1.2)
Total Protein: 7.9 g/dL (ref 6.0–8.3)

## 2012-02-28 LAB — LACTIC ACID, PLASMA: Lactic Acid, Venous: 0.7 mmol/L (ref 0.5–2.2)

## 2012-02-28 LAB — OCCULT BLOOD, POC DEVICE: Fecal Occult Bld: NEGATIVE

## 2012-02-28 LAB — LIPASE, BLOOD: Lipase: 18 U/L (ref 11–59)

## 2012-02-28 MED ORDER — IOHEXOL 300 MG/ML  SOLN
100.0000 mL | Freq: Once | INTRAMUSCULAR | Status: AC | PRN
  Administered 2012-02-28: 100 mL via INTRAVENOUS

## 2012-02-28 MED ORDER — GLYCERIN (LAXATIVE) 2.1 G RE SUPP
1.0000 | Freq: Once | RECTAL | Status: AC
  Administered 2012-02-28: 1 via RECTAL
  Filled 2012-02-28: qty 1

## 2012-02-28 MED ORDER — HYDROMORPHONE HCL PF 1 MG/ML IJ SOLN
1.0000 mg | Freq: Once | INTRAMUSCULAR | Status: DC
  Filled 2012-02-28: qty 1

## 2012-02-28 MED ORDER — HYDROMORPHONE HCL PF 1 MG/ML IJ SOLN
1.0000 mg | Freq: Once | INTRAMUSCULAR | Status: AC
  Administered 2012-02-28: 1 mg via INTRAVENOUS

## 2012-02-28 MED ORDER — ONDANSETRON HCL 4 MG/2ML IJ SOLN
4.0000 mg | Freq: Once | INTRAMUSCULAR | Status: AC
  Administered 2012-02-28: 4 mg via INTRAVENOUS
  Filled 2012-02-28: qty 2

## 2012-02-28 MED ORDER — HYDROMORPHONE HCL PF 1 MG/ML IJ SOLN
1.0000 mg | Freq: Once | INTRAMUSCULAR | Status: AC
  Administered 2012-02-28: 1 mg via INTRAVENOUS
  Filled 2012-02-28: qty 1

## 2012-02-28 MED ORDER — LACTULOSE 10 GM/15ML PO SOLN
20.0000 g | Freq: Once | ORAL | Status: AC
  Administered 2012-02-28: 20 g via ORAL
  Filled 2012-02-28: qty 30

## 2012-02-28 MED ORDER — SODIUM CHLORIDE 0.9 % IV BOLUS (SEPSIS)
1000.0000 mL | Freq: Once | INTRAVENOUS | Status: AC
  Administered 2012-02-28: 1000 mL via INTRAVENOUS

## 2012-02-28 MED ORDER — IOHEXOL 300 MG/ML  SOLN
20.0000 mL | INTRAMUSCULAR | Status: AC
  Administered 2012-02-28: 20 mL via ORAL

## 2012-02-28 NOTE — ED Notes (Signed)
Maye Hides, roommate contact information [phone], [place] Public friend contact information [phone]

## 2012-02-28 NOTE — ED Provider Notes (Signed)
History     CSN: [id number]  Arrival date & time 02/28/12  1748   First MD Initiated Contact with Patient 02/28/12 1924      Chief Complaint  Patient presents with  . Abdominal Pain  . Constipation    (Consider location/radiation/quality/duration/timing/severity/associated sxs/prior treatment) HPI The patient with known history of prostate cancer with metastases to bone presents to the ED abdominal pain for 1 week.  He reports generalized abdominal pain.  Reports last BM was 1.5 weeks ago, reports passing a small amount of flatus. Denies small amount of diarrhea. Deneies hematochezia, melena, or pus.  He denies nausea or vomiting, fever, chills.  He reports taking an OTC laxative on Sunday without BM.  Reports dyspnea for one week. Does not require oxygen at home.  Patient denies chest pain, headache, weakness, visual changes.  Patient, states that he has not had a bowel movement in 8 days.  He states, stomach feels, distended    Past Medical History  Diagnosis Date  . Hypertension   . Diverticulosis     Noted on CT abdomen (06/2010)  . Hyperlipidemia   . Elevated PSA 02/13/12    450.60  . Bone cancer     diffuse T-L spine mets  . 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 Patsi Sears (06/2010) // CT abdomen and pelvis  (06/2010) revealed signficant retroperitoneal and pelvic adenopathy // Bone scan (06/2010) - showed diffuse osseous abnormality  . Hot flashes   . Anxiety     mild new dx    Past Surgical History  Procedure Date  . Orchiectomy 06/2010    bilateral  . Tonsillectomy   . Transurethral resection of prostate 06/2010    gleason 4+3=7    History reviewed. No pertinent family history.  History  Substance Use Topics  . Smoking status: Current Every Day Smoker -- 0.5 packs/day for 39 years    Types: Cigarettes  . Smokeless tobacco: Never Used     Comment: hx of 1  PPD  . Alcohol Use: Yes     Comment: occasional last 2 weeks ago, gin      Review of Systems All other systems negative except as documented in the HPI. All pertinent positives and negatives as reviewed in the HPI.   Allergies  Review of patient's allergies indicates no known allergies.  Home Medications   Current Outpatient Rx  Name  Route  Sig  Dispense  Refill  . VITAMIN D 1000 UNITS PO TABS   Oral   Take 1,000 Units by mouth daily.         Marland Kitchen DOCUSATE SODIUM 100 MG PO CAPS   Oral   Take 100 mg by mouth 2 (two) times daily.         Marland Kitchen HYDROMORPHONE HCL 4 MG PO TABS   Oral   Take 1 tablet (4 mg total) by mouth every 4 (four) hours as needed for pain.   20 tablet   0   . OXYCODONE HCL 5 MG PO TABS   Oral   Take 5-10 mg by mouth every 4 (four) hours as needed. For pain           BP 148/91  Pulse 105  Temp 98.5 F (36.9 C) (Oral)  Resp 18  SpO2 91%  Physical Exam  Nursing note and vitals reviewed. Constitutional: He appears well-developed and well-nourished.  HENT:  Head: Normocephalic and atraumatic.  Mouth/Throat: Oropharynx is clear and moist.  Eyes: Pupils are equal, round, and reactive to light.  Neck: Normal range of motion. Neck supple.  Cardiovascular: Normal rate, regular rhythm and normal heart sounds.  Exam reveals no gallop and no friction rub.   No murmur heard. Pulmonary/Chest: Tachypnea noted. No respiratory distress. He has no decreased breath sounds. He has no wheezes. He has no rhonchi. He has no rales.  Abdominal: Bowel sounds are normal. He exhibits distension. He exhibits no fluid wave and no ascites. There is generalized tenderness. There is no rigidity, no rebound and no guarding.  Genitourinary: Rectal exam shows no tenderness. Prostate is enlarged.       Enlarged and hard prostate.  No stool in vault.  Neurological: He is alert.  Skin: Skin is warm and dry. No rash noted.    ED Course  Procedures (including critical care  time)   Labs Reviewed  CBC  COMPREHENSIVE METABOLIC PANEL  LACTIC ACID, PLASMA   Mr Lumbar Spine W Wo Contrast  02/27/2012  *RADIOLOGY REPORT*  Clinical Data: Metastatic prostate cancer.  Worsening back pain.  MRI LUMBAR SPINE WITHOUT AND WITH CONTRAST  Technique:  Multiplanar and multiecho pulse sequences of the lumbar spine were obtained without and with intravenous contrast.  Contrast: 20mL MULTIHANCE GADOBENATE DIMEGLUMINE 529 MG/ML IV SOLN  Comparison: 02/10/2012 radiographs.  Findings: The numbering convention used for this exam terms L5-S1 as the last full intervertebral disc space above the sacrum. Diffuse osseous metastatic disease, presumably representing metastatic prostate cancer.  Biconcave L3 compression fractures present with 25% loss of vertebral body height.  No retropulsion. Spinal cord terminates posterior to the L1-L2 interspace.  There is presacral and paraspinal edema and enhancement, likely reactive to metastatic disease.  No displaced sacral fracture is identified. Right renal cyst is present.  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  34 mm infrarenal abdominal aortic aneurysm.  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  There is dural thickening and enhancement in the inferior lumbar spine, suspicious for dural spread of tumor (image number [id number]).  No definite intrathecal metastatic disease.  There is clumping of the nerve roots in the thecal sac with an appearance suggestive of arachnoiditis.  Superimposed degenerative disc disease is mild.  This is most pronounced at L5-S1, where there is disc desiccation and broad- based posterior disc bulging without resulting stenosis.  There is no central canal compromise associated with extraosseous extension of tumor.  Posterior element vertebral invasion is most pronounced at L3.  IMPRESSION: 1.  Extensive diffuse metastatic disease to the entire visualized axial skeleton.  Pathologic compression  fracture of L3 with 25% loss of vertebral body height and no retropulsion.  No central or foraminal stenosis due to extraosseous extension of tumor. 2.  Dural thickening enhancement compatible with dural spread of metastatic disease in this patient with prostate cancer. 3.  34 mm infrarenal abdominal aortic aneurysm appears similar to prior exam.   Original Report Authenticated By: Andreas Newport, M.D.    Dg Abd Acute W/chest  02/28/2012  *RADIOLOGY REPORT*  Clinical Data: Abdominal pain, constipation  ACUTE ABDOMEN SERIES (ABDOMEN 2 VIEW & CHEST 1 VIEW)  Comparison: Abdominal radiograph 02/10/2012; chest x-ray 01/14/2012  Findings: Very low inspiratory volumes with increased pulmonary vascular congestion and mild edema.  There is a right pleural effusion with fluid tracking in the minor fissure.  Cardiomegaly appears similar to prior given the low inspiratory volumes. Gaseous  dilatation of the to the level of the mid descending colon. A small amount of gas and stool noted in the sigmoid colon and rectum.  Fairly large volume of formed stool in the cecum.  Diffuse areas of patchy sclerosis throughout the visualized skeleton consistent with known metastatic disease.  IMPRESSION:  1.  The cecum is moderately distended with stool.  There is gaseous distension of the colon to the mid descending colon where there is an abrupt cutoff of the gas column, with only scattered locules of gas in the sigmoid colon and rectum.  2.  Given the abrupt cutoff in the descending colon, query underlying diverticulitis, or less likely nonobstructing mass lesion. CT scan of the abdomen and pelvis with IV contrast could further evaluate.  3.  Diffuse osseous metastatic disease  4.  Very low inspiratory volumes with pulmonary vascular congestion and small right pleural effusion   Original Report Authenticated By: Malachy Moan, M.D.    patient be admitted to the hospital for further care of his multiple issues.  Patient has been  fairly stable.    I spoke with urologist, Dr. Vernie Ammons about the findings on the CT scan, and he felt that this could be managed as an outpatient.  Due to the fact that his creatinine is normal.  MDM   MDM Reviewed: nursing note and vitals Interpretation: labs, ECG, x-ray and CT scan Consults: admitting MD and urology    Date: 02/29/2012  Rate: 94  Rhythm: normal sinus rhythm  QRS Axis: normal  Intervals: normal  ST/T Wave abnormalities: nonspecific T wave changes  Conduction Disutrbances:none  Narrative Interpretation:   Old EKG Reviewed: unchanged           Carlyle Dolly, PA-C 02/29/12 0229

## 2012-02-28 NOTE — ED Notes (Signed)
Pt c/o abd pain and bloating with no BM x 1.5 weeks; pt sts hx of bone CA and has been taking pain meds for back pain

## 2012-02-29 ENCOUNTER — Emergency Department (HOSPITAL_COMMUNITY): Payer: Medicaid Other

## 2012-02-29 ENCOUNTER — Encounter (HOSPITAL_COMMUNITY): Payer: Self-pay | Admitting: Radiology

## 2012-02-29 DIAGNOSIS — R6889 Other general symptoms and signs: Secondary | ICD-10-CM

## 2012-02-29 DIAGNOSIS — N390 Urinary tract infection, site not specified: Secondary | ICD-10-CM | POA: Diagnosis present

## 2012-02-29 DIAGNOSIS — I509 Heart failure, unspecified: Secondary | ICD-10-CM

## 2012-02-29 DIAGNOSIS — J90 Pleural effusion, not elsewhere classified: Secondary | ICD-10-CM | POA: Diagnosis present

## 2012-02-29 DIAGNOSIS — R109 Unspecified abdominal pain: Secondary | ICD-10-CM

## 2012-02-29 DIAGNOSIS — R0602 Shortness of breath: Secondary | ICD-10-CM

## 2012-02-29 LAB — URINE MICROSCOPIC-ADD ON

## 2012-02-29 LAB — CBC WITH DIFFERENTIAL/PLATELET
Basophils Absolute: 0 10*3/uL (ref 0.0–0.1)
Eosinophils Absolute: 0 10*3/uL (ref 0.0–0.7)
Eosinophils Relative: 0 % (ref 0–5)
Lymphs Abs: 2 10*3/uL (ref 0.7–4.0)
MCH: 27.4 pg (ref 26.0–34.0)
MCHC: 32 g/dL (ref 30.0–36.0)
MCV: 85.5 fL (ref 78.0–100.0)
Monocytes Absolute: 1.6 10*3/uL — ABNORMAL HIGH (ref 0.1–1.0)
Platelets: 261 10*3/uL (ref 150–400)
RDW: 16.3 % — ABNORMAL HIGH (ref 11.5–15.5)

## 2012-02-29 LAB — COMPREHENSIVE METABOLIC PANEL
ALT: 118 U/L — ABNORMAL HIGH (ref 0–53)
AST: 53 U/L — ABNORMAL HIGH (ref 0–37)
Albumin: 3.1 g/dL — ABNORMAL LOW (ref 3.5–5.2)
Alkaline Phosphatase: 372 U/L — ABNORMAL HIGH (ref 39–117)
CO2: 23 mEq/L (ref 19–32)
Chloride: 95 mEq/L — ABNORMAL LOW (ref 96–112)
GFR calc non Af Amer: >90 mL/min (ref >90)
Potassium: 3.5 mEq/L (ref 3.5–5.1)
Total Bilirubin: 0.7 mg/dL (ref 0.3–1.2)

## 2012-02-29 LAB — TROPONIN I: Troponin I: <0.3 ng/mL (ref <0.30)

## 2012-02-29 LAB — URINALYSIS, ROUTINE W REFLEX MICROSCOPIC
Glucose, UA: NEGATIVE mg/dL
Ketones, ur: 15 mg/dL — AB
Leukocytes, UA: NEGATIVE
Nitrite: NEGATIVE
Protein, ur: 100 mg/dL — AB
Specific Gravity, Urine: >1.03 — ABNORMAL HIGH (ref 1.005–1.030)
Urobilinogen, UA: 1 mg/dL (ref 0.0–1.0)
pH: 5.5 (ref 5.0–8.0)

## 2012-02-29 LAB — GLUCOSE, CAPILLARY: Glucose-Capillary: 159 mg/dL — ABNORMAL HIGH (ref 70–99)

## 2012-02-29 MED ORDER — POLYETHYLENE GLYCOL 3350 17 G PO PACK
17.0000 g | PACK | Freq: Every day | ORAL | Status: DC
  Administered 2012-02-29: 17 g via ORAL
  Filled 2012-02-29 (×2): qty 1

## 2012-02-29 MED ORDER — ONDANSETRON HCL 4 MG PO TABS: 4.0000 mg | ORAL_TABLET | Freq: Four times a day (QID) | ORAL | Status: DC | PRN

## 2012-02-29 MED ORDER — SODIUM CHLORIDE 0.9 % IJ SOLN
3.0000 mL | Freq: Two times a day (BID) | INTRAMUSCULAR | Status: DC
  Administered 2012-02-29 – 2012-03-01 (×3): 3 mL via INTRAVENOUS

## 2012-02-29 MED ORDER — ONDANSETRON HCL 4 MG/2ML IJ SOLN: 4.0000 mg | Freq: Four times a day (QID) | INTRAMUSCULAR | Status: DC | PRN

## 2012-02-29 MED ORDER — IOHEXOL 350 MG/ML SOLN
100.0000 mL | Freq: Once | INTRAVENOUS | Status: AC | PRN
  Administered 2012-02-29: 100 mL via INTRAVENOUS

## 2012-02-29 MED ORDER — FUROSEMIDE 10 MG/ML IJ SOLN
40.0000 mg | Freq: Once | INTRAMUSCULAR | Status: AC
  Administered 2012-02-29: 40 mg via INTRAVENOUS
  Filled 2012-02-29: qty 4

## 2012-02-29 MED ORDER — VANCOMYCIN HCL IN DEXTROSE 1-5 GM/200ML-% IV SOLN
1000.0000 mg | Freq: Two times a day (BID) | INTRAVENOUS | Status: DC
  Administered 2012-02-29 – 2012-03-01 (×2): 1000 mg via INTRAVENOUS
  Filled 2012-02-29 (×4): qty 200

## 2012-02-29 MED ORDER — HYDRALAZINE HCL 20 MG/ML IJ SOLN
10.0000 mg | INTRAMUSCULAR | Status: DC | PRN
  Filled 2012-02-29: qty 0.5

## 2012-02-29 MED ORDER — SODIUM CHLORIDE 0.9 % IJ SOLN
3.0000 mL | Freq: Two times a day (BID) | INTRAMUSCULAR | Status: DC
  Administered 2012-02-29 – 2012-03-01 (×4): 3 mL via INTRAVENOUS

## 2012-02-29 MED ORDER — LEVOFLOXACIN IN D5W 750 MG/150ML IV SOLN
750.0000 mg | INTRAVENOUS | Status: DC
  Administered 2012-02-29: 750 mg via INTRAVENOUS
  Filled 2012-02-29 (×2): qty 150

## 2012-02-29 MED ORDER — VANCOMYCIN HCL 10 G IV SOLR
2000.0000 mg | Freq: Once | INTRAVENOUS | Status: AC
  Administered 2012-02-29: 2000 mg via INTRAVENOUS
  Filled 2012-02-29: qty 2000

## 2012-02-29 MED ORDER — DOCUSATE SODIUM 100 MG PO CAPS
100.0000 mg | ORAL_CAPSULE | Freq: Two times a day (BID) | ORAL | Status: DC
  Administered 2012-02-29: 100 mg via ORAL
  Filled 2012-02-29 (×3): qty 1

## 2012-02-29 MED ORDER — CEFEPIME HCL 2 G IJ SOLR
2.0000 g | Freq: Two times a day (BID) | INTRAMUSCULAR | Status: DC
  Administered 2012-02-29 – 2012-03-01 (×3): 2 g via INTRAVENOUS
  Filled 2012-02-29 (×5): qty 2

## 2012-02-29 MED ORDER — SENNA 8.6 MG PO TABS
1.0000 | ORAL_TABLET | Freq: Every day | ORAL | Status: DC
  Administered 2012-02-29: 8.6 mg via ORAL
  Filled 2012-02-29 (×2): qty 1

## 2012-02-29 MED ORDER — ACETAMINOPHEN 325 MG PO TABS
650.0000 mg | ORAL_TABLET | Freq: Four times a day (QID) | ORAL | Status: DC | PRN
  Administered 2012-02-29 (×2): 650 mg via ORAL
  Filled 2012-02-29 (×2): qty 2

## 2012-02-29 MED ORDER — FUROSEMIDE 10 MG/ML IJ SOLN
40.0000 mg | Freq: Two times a day (BID) | INTRAMUSCULAR | Status: DC
  Administered 2012-02-29 – 2012-03-01 (×3): 40 mg via INTRAVENOUS
  Filled 2012-02-29 (×5): qty 4

## 2012-02-29 MED ORDER — BOOST / RESOURCE BREEZE PO LIQD
1.0000 | Freq: Three times a day (TID) | ORAL | Status: DC
  Administered 2012-02-29: 1 via ORAL

## 2012-02-29 MED ORDER — ACETAMINOPHEN 650 MG RE SUPP: 650.0000 mg | Freq: Four times a day (QID) | RECTAL | Status: DC | PRN

## 2012-02-29 MED ORDER — DEXTROSE 5 % IV SOLN
1.0000 g | Freq: Once | INTRAVENOUS | Status: AC
  Administered 2012-02-29: 1 g via INTRAVENOUS
  Filled 2012-02-29: qty 10

## 2012-02-29 NOTE — Progress Notes (Signed)
During initial assessment, Pt expressed tiredness and hopelessness about pain and disease progression. Also,  Pt stated his social worker is not listening to him.

## 2012-02-29 NOTE — H&P (Signed)
Stephen Stokes is an 56 y.o. male.  Patient was seen and examined on February 29, 2012. PCP - [facility]. Oncologist - Dr. Clelia Croft.  Chief Complaint: Abdominal pain and discomfort. HPI: 56 year-old male with history of metastatic prostate cancer, hypertension presents to the ER because of increasing abdominal discomfort over the last one week. Patient states that he's been having discomfort all over his abdomen with distention. Patient has not had any nausea vomiting. Patient has not moved his bowels for last one week. Denies any fever chills. In addition patient has been experiencing shortness of breath for the same period of time. Denies any productive cough or chest pain. In the ER patient had CT abdomen and pelvis and CT angiogram of the chest. CT abdomen pelvis reveals dilated colon with no obstruction. In addition also shows left-sided perinephric stranding with stone in the renal pelvis. CT angiogram of the chest shows no PE but does show new bilateral pleural effusion.  Past Medical History  Diagnosis Date  . Hypertension   . Diverticulosis     Noted on CT abdomen (06/2010)  . Hyperlipidemia   . Elevated PSA 02/13/12    450.60  . Bone cancer     diffuse T-L spine mets  . 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 Patsi Sears (06/2010) // CT abdomen and pelvis  (06/2010) revealed signficant retroperitoneal and pelvic adenopathy // Bone scan (06/2010) - showed diffuse osseous abnormality  . Hot flashes   . Anxiety     mild new dx    Past Surgical History  Procedure Date  . Orchiectomy 06/2010    bilateral  . Tonsillectomy   . Transurethral resection of prostate 06/2010    gleason 4+3=7    History reviewed. No pertinent family history. Social History:  reports that he has been smoking Cigarettes.  He has a 19.5 pack-year smoking history. He has never used smokeless tobacco. He  reports that he drinks alcohol. He reports that he does not use illicit drugs.  Allergies: No Known Allergies   (Not in a hospital admission)  Results for orders placed during the hospital encounter of 02/28/12 (from the past 48 hour(s))  CBC     Status: Abnormal   Collection Time   02/28/12  7:58 PM      Component Value Range Comment   WBC 15.3 (*) 4.0 - 10.5 K/uL    RBC 3.34 (*) 4.22 - 5.81 MIL/uL    Hemoglobin 9.0 (*) 13.0 - 17.0 g/dL    HCT 40.9 (*) 81.1 - 52.0 %    MCV 85.3  78.0 - 100.0 fL    MCH 26.9  26.0 - 34.0 pg    MCHC 31.6  30.0 - 36.0 g/dL    RDW 91.4 (*) 78.2 - 15.5 %    Platelets 246  150 - 400 K/uL   COMPREHENSIVE METABOLIC PANEL     Status: Abnormal   Collection Time   02/28/12  7:58 PM      Component Value Range Comment   Sodium 130 (*) 135 - 145 mEq/L    Potassium 3.8  3.5 - 5.1 mEq/L    Chloride 93 (*) 96 - 112 mEq/L    CO2 22  19 - 32 mEq/L    Glucose, Bld 140 (*) 70 - 99 mg/dL    BUN 13  6 - 23 mg/dL    Creatinine,  Ser 0.68  0.50 - 1.35 mg/dL    Calcium 8.6  8.4 - 16.1 mg/dL    Total Protein 7.9  6.0 - 8.3 g/dL    Albumin 3.0 (*) 3.5 - 5.2 g/dL    AST 63 (*) 0 - 37 U/L    ALT 128 (*) 0 - 53 U/L    Alkaline Phosphatase 369 (*) 39 - 117 U/L    Total Bilirubin 0.6  0.3 - 1.2 mg/dL    GFR calc non Af Amer >90  >90 mL/min    GFR calc Af Amer >90  >90 mL/min   LIPASE, BLOOD     Status: Normal   Collection Time   02/28/12  7:58 PM      Component Value Range Comment   Lipase 18  11 - 59 U/L   LACTIC ACID, PLASMA     Status: Normal   Collection Time   02/28/12  8:01 PM      Component Value Range Comment   Lactic Acid, Venous 0.7  0.5 - 2.2 mmol/L   OCCULT BLOOD, POC DEVICE     Status: Normal   Collection Time   02/28/12  8:44 PM      Component Value Range Comment   Fecal Occult Bld NEGATIVE  NEGATIVE   URINALYSIS, ROUTINE W REFLEX MICROSCOPIC     Status: Abnormal   Collection Time   02/29/12  1:05 AM      Component Value Range Comment   Color,  Urine AMBER (*) YELLOW BIOCHEMICALS MAY BE AFFECTED BY COLOR   APPearance CLOUDY (*) CLEAR    Specific Gravity, Urine >1.030 (*) 1.005 - 1.030    pH 5.5  5.0 - 8.0    Glucose, UA NEGATIVE  NEGATIVE mg/dL    Hgb urine dipstick LARGE (*) NEGATIVE    Bilirubin Urine SMALL (*) NEGATIVE    Ketones, ur 15 (*) NEGATIVE mg/dL    Protein, ur 096 (*) NEGATIVE mg/dL    Urobilinogen, UA 1.0  0.0 - 1.0 mg/dL    Nitrite NEGATIVE  NEGATIVE    Leukocytes, UA NEGATIVE  NEGATIVE   URINE MICROSCOPIC-ADD ON     Status: Abnormal   Collection Time   02/29/12  1:05 AM      Component Value Range Comment   Squamous Epithelial / LPF FEW (*) RARE    WBC, UA 3-6  <3 WBC/hpf    RBC / HPF 21-50  <3 RBC/hpf    Bacteria, UA RARE  RARE    Urine-Other MUCOUS PRESENT      Ct Angio Chest Pe W/cm &/or Wo Cm  02/29/2012  *RADIOLOGY REPORT*  Clinical Data: Abdominal pain, shortness of breath.  CT ANGIOGRAPHY CHEST  Technique:  Multidetector CT imaging of the chest using the standard protocol during bolus administration of intravenous contrast. Multiplanar reconstructed images including MIPs were obtained and reviewed to evaluate the vascular anatomy.  Contrast: OMNIPAQUE IOHEXOL 350 MG/ML SOLN  Comparison: 02/28/2012 radiograph  Findings: No main branch pulmonary embolism.  Lobar and more peripheral branches are degraded by motion and contrast bolus timing.  Normal caliber aorta with mild scattered atherosclerosis. Cardiomegaly.  Coronary artery calcification.  No pericardial effusion.  Bilateral pleural effusions.  Associated airspace consolidations.  Mild areas of ground-glass opacity. Detailed lung parenchymal evaluation degraded by motion.  No intrathoracic lymphadenopathy.  Central airways are grossly patent.  Limited images through the upper abdomen show no acute finding.  Osseous sclerotic metastases.  No acute fracture identified.  IMPRESSION: No main branch pulmonary embolism identified. Lobar and more peripheral  branches are poorly evaluated due to respiratory motion and suboptimal contrast bolus timing.  Cardiomegaly with coronary artery calcification.  Bilateral pleural effusions with associated airspace consolidations; atelectasis versus infiltrate.  Mild ground-glass opacities may reflect edema.  Sclerotic osseous metastases.   Original Report Authenticated By: Jearld Lesch, M.D.    Mr Lumbar Spine W Wo Contrast  02/27/2012  *RADIOLOGY REPORT*  Clinical Data: Metastatic prostate cancer.  Worsening back pain.  MRI LUMBAR SPINE WITHOUT AND WITH CONTRAST  Technique:  Multiplanar and multiecho pulse sequences of the lumbar spine were obtained without and with intravenous contrast.  Contrast: 20mL MULTIHANCE GADOBENATE DIMEGLUMINE 529 MG/ML IV SOLN  Comparison: 02/10/2012 radiographs.  Findings: The numbering convention used for this exam terms L5-S1 as the last full intervertebral disc space above the sacrum. Diffuse osseous metastatic disease, presumably representing metastatic prostate cancer.  Biconcave L3 compression fractures present with 25% loss of vertebral body height.  No retropulsion. Spinal cord terminates posterior to the L1-L2 interspace.  There is presacral and paraspinal edema and enhancement, likely reactive to metastatic disease.  No displaced sacral fracture is identified. Right renal cyst is present.  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  34 mm infrarenal abdominal aortic aneurysm.  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  There is dural thickening and enhancement in the inferior lumbar spine, suspicious for dural spread of tumor (image number 32 series 9).  No definite intrathecal metastatic disease.  There is clumping of the nerve roots in the thecal sac with an appearance suggestive of arachnoiditis.  Superimposed degenerative disc disease is mild.  This is most pronounced at L5-S1, where there is disc desiccation and broad- based posterior disc bulging  without resulting stenosis.  There is no central canal compromise associated with extraosseous extension of tumor.  Posterior element vertebral invasion is most pronounced at L3.  IMPRESSION: 1.  Extensive diffuse metastatic disease to the entire visualized axial skeleton.  Pathologic compression fracture of L3 with 25% loss of vertebral body height and no retropulsion.  No central or foraminal stenosis due to extraosseous extension of tumor. 2.  Dural thickening enhancement compatible with dural spread of metastatic disease in this patient with prostate cancer. 3.  34 mm infrarenal abdominal aortic aneurysm appears similar to prior exam.   Original Report Authenticated By: Andreas Newport, M.D.    Ct Abdomen Pelvis W Contrast  02/28/2012  *RADIOLOGY REPORT*  Clinical Data: Abdominal pain and constipation. History of metastatic prostate cancer.  CT ABDOMEN AND PELVIS WITH CONTRAST  Technique:  Multidetector CT imaging of the abdomen and pelvis was performed following the standard protocol during bolus administration of intravenous contrast.  Contrast: OMNIPAQUE IOHEXOL 300 MG/ML  SOLN  Comparison: CT abdomen pelvis 05/23/2010  Findings:  Lung bases:  There are small to moderate appearing bilateral pleural effusions at the lung bases.  The pleural effusions are not completely imaged.  There is bibasilar collapse and/or consolidation.  Cardiomegaly is present.  There is an elongate stone filling the left renal pelvis and extending to the left ureteropelvic junction/proximal left ureter. This stone measures at least 2.4 x 1.3 cm and results in mild hydronephrosis of the left kidney.  A separate stone is seen in the lower pole of the left renal collecting system, measuring 6 mm. There is stranding around the left renal pelvis and proximal left ureter.  Beyond this obstructing stone, the remainder of the  left ureter is completely decompressed.  There is a stable cyst in the posterior right kidney.  There is no  hydronephrosis or stone on the right.  The right ureter is normal in caliber.  Urinary bladder appears within normal limits.  The prostate gland appears smaller on today's study compared to prior.  Left pelvic sidewall lymph node has decreased in size to 1.8 x 1.3 cm (previously 3.0 x 2.3 cm in March 2012).  Right pelvic sidewall lymph node on image 78 measures 5 x 8 mm (previously 15 x 20 mm).  There is a prominent amount of stool in the cecum, ascending colon and hepatic flexure of the colon. There is a lesser degree of stool in the distal sigmoid colon and rectum. There is mild diffuse gaseous distention of the colon.  No evidence of bowel obstruction. There are scattered colonic diverticula, most prominent in the descending and sigmoid regions.  The appendix is normal.  Small bowel loops are normal in caliber wall thickness.  The stomach contains a small diverticulum extending from the fundus, and is otherwise unremarkable.  The liver, gallbladder, spleen, gallbladder and pancreas are within normal limits.  There is atherosclerotic disease of the abdominal aorta with infrarenal abdominal aortic ectasia measuring up to the 3 cm AP diameter, stable.  There is stable ectasia of the left common iliac artery.  Innumerable sclerotic and lucent lesions in the imaged portion of the skeleton are consistent with the patient's known history of diffuse bony metastatic disease.  Overall, the degree of bony metastatic disease appears slightly improved compared to the CT of March 2012.  No pathologic fracture is identified.  IMPRESSION:  1.  Large stone in the left renal pelvis extending into the ureteropelvic junction/proximal left ureter results in mild left hydronephrosis and inflammatory stranding around the left renal pelvis. 2.  There is some gaseous distention of the colon, but no evidence of bowel obstruction.  Moderate amount of stool in the proximal colon.  Question if the patient could have clinical constipation. 3.   Small to moderate bilateral pleural effusions with overlying collapse and/or consolidation of the lower lobes.  These pleural effusions are new compared to prior CT of March 2012. 4.  Positive response to therapy compared to prior CT of 2012 evidenced by decreasing lymphadenopathy in the pelvis, decreased size of the prostate gland and some improvement in the overall appearance of the patient's diffuse bony metastatic disease. 5.  Colonic diverticulosis. 6.  Stable aorta and left common iliac ectasia.   Original Report Authenticated By: Britta Mccreedy, M.D.    Dg Abd Acute W/chest  02/28/2012  *RADIOLOGY REPORT*  Clinical Data: Abdominal pain, constipation  ACUTE ABDOMEN SERIES (ABDOMEN 2 VIEW & CHEST 1 VIEW)  Comparison: Abdominal radiograph 02/10/2012; chest x-ray 01/14/2012  Findings: Very low inspiratory volumes with increased pulmonary vascular congestion and mild edema.  There is a right pleural effusion with fluid tracking in the minor fissure.  Cardiomegaly appears similar to prior given the low inspiratory volumes. Gaseous dilatation of the to the level of the mid descending colon. A small amount of gas and stool noted in the sigmoid colon and rectum.  Fairly large volume of formed stool in the cecum.  Diffuse areas of patchy sclerosis throughout the visualized skeleton consistent with known metastatic disease.  IMPRESSION:  1.  The cecum is moderately distended with stool.  There is gaseous distension of the colon to the mid descending colon where there is an abrupt cutoff of the  gas column, with only scattered locules of gas in the sigmoid colon and rectum.  2.  Given the abrupt cutoff in the descending colon, query underlying diverticulitis, or less likely nonobstructing mass lesion. CT scan of the abdomen and pelvis with IV contrast could further evaluate.  3.  Diffuse osseous metastatic disease  4.  Very low inspiratory volumes with pulmonary vascular congestion and small right pleural effusion    Original Report Authenticated By: Malachy Moan, M.D.     Review of Systems  Constitutional: Negative.   HENT: Negative.   Eyes: Negative.   Respiratory: Positive for shortness of breath.   Gastrointestinal: Positive for abdominal pain.  Genitourinary: Negative.   Musculoskeletal: Negative.   Skin: Negative.   Neurological: Negative.   Endo/Heme/Allergies: Negative.   Psychiatric/Behavioral: Negative.     Blood pressure 151/90, pulse 90, temperature 98.5 F (36.9 C), temperature source Oral, resp. rate 32, SpO2 91.00%. Physical Exam  Constitutional: He is oriented to person, place, and time. He appears well-developed and well-nourished. No distress.  HENT:  Head: Normocephalic and atraumatic.  Right Ear: External ear normal.  Left Ear: External ear normal.  Nose: Nose normal.  Mouth/Throat: Oropharynx is clear and moist. No oropharyngeal exudate.  Eyes: Conjunctivae normal are normal. Pupils are equal, round, and reactive to light. Right eye exhibits no discharge. Left eye exhibits no discharge. No scleral icterus.  Neck: Normal range of motion. Neck supple.  Cardiovascular: Normal rate and regular rhythm.   Respiratory: Effort normal and breath sounds normal. No respiratory distress. He has no wheezes. He has no rales.  GI: Soft. He exhibits distension. There is no tenderness. There is no rebound and no guarding.  Musculoskeletal: He exhibits no edema and no tenderness.  Neurological: He is alert and oriented to person, place, and time.  Skin: Skin is warm and dry. He is not diaphoretic.     Assessment/Plan #1. Abdominal discomfort and distention - most likely from ileus. At this time patient is kept on clear liquids. I have ordered one dose of soap suds enema. If symptoms does not improve consult GI. #2. UTI with left perinephric stranding and renal stone - Dr. Vernie Ammons was consulted by the ER physician. Since the stone is high up in the pelvis they have advised to observe  and will need followup as outpatient. At this time patient has been placed on antibiotics for UTI. Follow urine cultures. #3. Shortness of breath most likely from new bilateral pleural effusion - patient has been given one dose of Lasix which we will continue as Lasix 40 mg every 12. Patient's pleural effusion could be from CHF or metastatic disease. Consult pulmonary in a.m. Check 2-D echo and BNP. #4. Hypertension - presently patient does not take any blood pressure medication. Patient has been placed on when necessary IV hydralazine for systolic blood pressure was 160. #5. Anemia - follow CBC. Stool for occult blood was negative. #6. Metastatic prostate cancer - per oncology.  CODE STATUS - DO NOT RESUSCITATE as discussed with patient.  Raena Pau N. 02/29/2012, 3:07 AM

## 2012-02-29 NOTE — ED Notes (Signed)
Patient back from CT.

## 2012-02-29 NOTE — ED Notes (Signed)
Admitting MD at bedside.

## 2012-02-29 NOTE — Progress Notes (Signed)
[facility] PROGRESS NOTE  Stephen Stokes ZOX:[id number] DOB: 24-Aug-1955 DOA: 02/28/2012 PCP: Burtis Junes, MD  Assessment/Plan:  #1. Abdominal discomfort and distention - most likely from narcotic induced ileus and is improved after a soap suds enema.  Bowel sounds improved.   -  Will start colace/senna and miralax  #2. UTI with left perinephric stranding and renal stone - Dr. Vernie Ammons was consulted by the ER physician. Since the stone is high up in the pelvis they have advised to observe and will need followup as outpatient. -  Continue cefepime -  Urine culture pending  #3. Shortness of breath may be from new bilateral pleural effusion or from abdominal distension causing difficulty with diaphragmatic excursion. Infiltrates versus atelectasis demonstrated on his CTa.  Patient was hospitalized in November and is being treated for HCAP.    -  Continue lasix.   -  BNP was not elevated, however, patient is obese and may cause false negative -  Grade 1 diastolic dysfunction on ECHO -  Continue lasix q12h for now -  Continue vanco and cefepime -  Wean to room air -  Consider repeat CXR after diuresis to see if infiltrate resolves.  If yes, then consider stopping tx for HCAP  #4. Hypertension - presently patient does not take any blood pressure medication. Patient has been placed on when necessary IV hydralazine for systolic blood pressure was 160.  -  Consider addition of HCTZ/lisinopril when pyelonephritis and pain improved  Leukocytosis - likely due to pneumonia and/or pyelonephritis #5. Anemia, normocytic - Stool for occult blood was negative.  #6. Metastatic prostate cancer - per oncology.  #7.  Patient received three contrasted studies within the last 48 hours and is currently being diuresed. Risk for CIN.   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 Patient asymptomatic, trend.   Transaminitis:  Trending down.  Unclear etiology.   Consider hepatitis panel Alk phos chronically elevated due to metastatic prostate cancer.    DIET:  Advance to healthy heart ACCESS:  PIV IVF:  None PROPH:  SCDs  Code Status: DNR Family Communication: spoke to patient alone Disposition Plan:  Pending breathing improved, tolerating diet, abdominal pain improved   Consultants:  None  Procedures:  CT angio chest   CT abd/pelvis with contrast  CXR/KUB  Antibiotics:  Vancomycin 12/20 >>  Cefepime 12/20 >>  Levofloxacin 12/20, then stopped  HPI/Subjective:  Patient states that he feels much better after his bowel movement this morning.  His abdomen feels less distended.  His breathing also improved post BM.  Has some cough.  Denies nausea, vomiting.    Objective: Filed Vitals:   02/29/12 0300 02/29/12 0355 02/29/12 0500 02/29/12 1335  BP:  144/87  146/99  Pulse:  99  90  Temp:  98 F (36.7 C)  99.2 F (37.3 C)  TempSrc:  Oral  Oral  Resp:  30  17  Height: 5\' 10"  (1.778 m) 5\' 10"  (1.778 m)    Weight: 113 kg (249 lb 1.9 oz)  108.909 kg (240 lb 1.6 oz)   SpO2:  93%  92%    Intake/Output Summary (Last 24 hours) at 02/29/12 1912 Last data filed at 02/29/12 1300  Gross per 24 hour  Intake   1183 ml  Output    600 ml  Net    583 ml   Filed Weights   02/29/12 0300 02/29/12 0500  Weight: 113 kg (249 lb 1.9 oz) 108.909 kg (240 lb  1.6 oz)    Exam:   General:  Obese AAM, no acute distress  HEENT:  MMM  Cardiovascular: RRR, no murmurs, rubs, or gallops  Respiratory:  Diminished with rales at the bilateral bases  Abdomen: Hyperactive BS, moderately distended, nontender  MSK:  No LEE  Data Reviewed: Basic Metabolic Panel:  Lab 02/29/12 1610 02/28/12 1958  NA 133* 130*  K 3.5 3.8  CL 95* 93*  CO2 23 22  GLUCOSE 141* 140*  BUN 11 13  CREATININE 0.67 0.68  CALCIUM 8.6 8.6  MG -- --  PHOS -- --   Liver Function Tests:  Lab 02/29/12 0330 02/28/12 1958  AST 53* 63*  ALT 118* 128*  ALKPHOS 372*  369*  BILITOT 0.7 0.6  PROT 7.9 7.9  ALBUMIN 3.1* 3.0*    Lab 02/28/12 1958  LIPASE 18  AMYLASE --   No results found for this basename: AMMONIA:5 in the last 168 hours CBC:  Lab 02/29/12 0330 02/28/12 1958  WBC 16.3* 15.3*  NEUTROABS 12.7* --  HGB 8.9* 9.0*  HCT 27.8* 28.5*  MCV 85.5 85.3  PLT 261 246   Cardiac Enzymes:  Lab 02/29/12 0329  CKTOTAL --  CKMB --  CKMBINDEX --  TROPONINI <0.30   BNP (last 3 results)  Basename 02/29/12 0329 01/14/12 0933  PROBNP 40.3 12.6   CBG:  Lab 02/29/12 1638  GLUCAP 159*    No results found for this or any previous visit (from the past 240 hour(s)).   Studies: Ct Angio Chest Pe W/cm &/or Wo Cm  02/29/2012  *RADIOLOGY REPORT*  Clinical Data: Abdominal pain, shortness of breath.  CT ANGIOGRAPHY CHEST  Technique:  Multidetector CT imaging of the chest using the standard protocol during bolus administration of intravenous contrast. Multiplanar reconstructed images including MIPs were obtained and reviewed to evaluate the vascular anatomy.  Contrast: OMNIPAQUE IOHEXOL 350 MG/ML SOLN  Comparison: 02/28/2012 radiograph  Findings: No main branch pulmonary embolism.  Lobar and more peripheral branches are degraded by motion and contrast bolus timing.  Normal caliber aorta with mild scattered atherosclerosis. Cardiomegaly.  Coronary artery calcification.  No pericardial effusion.  Bilateral pleural effusions.  Associated airspace consolidations.  Mild areas of ground-glass opacity. Detailed lung parenchymal evaluation degraded by motion.  No intrathoracic lymphadenopathy.  Central airways are grossly patent.  Limited images through the upper abdomen show no acute finding.  Osseous sclerotic metastases.  No acute fracture identified.  IMPRESSION: No main branch pulmonary embolism identified. Lobar and more peripheral branches are poorly evaluated due to respiratory motion and suboptimal contrast bolus timing.  Cardiomegaly with coronary  artery calcification.  Bilateral pleural effusions with associated airspace consolidations; atelectasis versus infiltrate.  Mild ground-glass opacities may reflect edema.  Sclerotic osseous metastases.   Original Report Authenticated By: Jearld Lesch, M.D.    Ct Abdomen Pelvis W Contrast  02/28/2012  *RADIOLOGY REPORT*  Clinical Data: Abdominal pain and constipation. History of metastatic prostate cancer.  CT ABDOMEN AND PELVIS WITH CONTRAST  Technique:  Multidetector CT imaging of the abdomen and pelvis was performed following the standard protocol during bolus administration of intravenous contrast.  Contrast: OMNIPAQUE IOHEXOL 300 MG/ML  SOLN  Comparison: CT abdomen pelvis 05/23/2010  Findings:  Lung bases:  There are small to moderate appearing bilateral pleural effusions at the lung bases.  The pleural effusions are not completely imaged.  There is bibasilar collapse and/or consolidation.  Cardiomegaly is present.  There is an elongate stone filling the left  renal pelvis and extending to the left ureteropelvic junction/proximal left ureter. This stone measures at least 2.4 x 1.3 cm and results in mild hydronephrosis of the left kidney.  A separate stone is seen in the lower pole of the left renal collecting system, measuring 6 mm. There is stranding around the left renal pelvis and proximal left ureter.  Beyond this obstructing stone, the remainder of the left ureter is completely decompressed.  There is a stable cyst in the posterior right kidney.  There is no hydronephrosis or stone on the right.  The right ureter is normal in caliber.  Urinary bladder appears within normal limits.  The prostate gland appears smaller on today's study compared to prior.  Left pelvic sidewall lymph node has decreased in size to 1.8 x 1.3 cm (previously 3.0 x 2.3 cm in March 2012).  Right pelvic sidewall lymph node on image 78 measures 5 x 8 mm (previously 15 x 20 mm).  There is a prominent amount of stool in the  cecum, ascending colon and hepatic flexure of the colon. There is a lesser degree of stool in the distal sigmoid colon and rectum. There is mild diffuse gaseous distention of the colon.  No evidence of bowel obstruction. There are scattered colonic diverticula, most prominent in the descending and sigmoid regions.  The appendix is normal.  Small bowel loops are normal in caliber wall thickness.  The stomach contains a small diverticulum extending from the fundus, and is otherwise unremarkable.  The liver, gallbladder, spleen, gallbladder and pancreas are within normal limits.  There is atherosclerotic disease of the abdominal aorta with infrarenal abdominal aortic ectasia measuring up to the 3 cm AP diameter, stable.  There is stable ectasia of the left common iliac artery.  Innumerable sclerotic and lucent lesions in the imaged portion of the skeleton are consistent with the patient's known history of diffuse bony metastatic disease.  Overall, the degree of bony metastatic disease appears slightly improved compared to the CT of March 2012.  No pathologic fracture is identified.  IMPRESSION:  1.  Large stone in the left renal pelvis extending into the ureteropelvic junction/proximal left ureter results in mild left hydronephrosis and inflammatory stranding around the left renal pelvis. 2.  There is some gaseous distention of the colon, but no evidence of bowel obstruction.  Moderate amount of stool in the proximal colon.  Question if the patient could have clinical constipation. 3.  Small to moderate bilateral pleural effusions with overlying collapse and/or consolidation of the lower lobes.  These pleural effusions are new compared to prior CT of March 2012. 4.  Positive response to therapy compared to prior CT of 2012 evidenced by decreasing lymphadenopathy in the pelvis, decreased size of the prostate gland and some improvement in the overall appearance of the patient's diffuse bony metastatic disease. 5.   Colonic diverticulosis. 6.  Stable aorta and left common iliac ectasia.   Original Report Authenticated By: Britta Mccreedy, M.D.    Dg Abd Acute W/chest  02/28/2012  *RADIOLOGY REPORT*  Clinical Data: Abdominal pain, constipation  ACUTE ABDOMEN SERIES (ABDOMEN 2 VIEW & CHEST 1 VIEW)  Comparison: Abdominal radiograph 02/10/2012; chest x-ray 01/14/2012  Findings: Very low inspiratory volumes with increased pulmonary vascular congestion and mild edema.  There is a right pleural effusion with fluid tracking in the minor fissure.  Cardiomegaly appears similar to prior given the low inspiratory volumes. Gaseous dilatation of the to the level of the mid descending colon. A small amount  of gas and stool noted in the sigmoid colon and rectum.  Fairly large volume of formed stool in the cecum.  Diffuse areas of patchy sclerosis throughout the visualized skeleton consistent with known metastatic disease.  IMPRESSION:  1.  The cecum is moderately distended with stool.  There is gaseous distension of the colon to the mid descending colon where there is an abrupt cutoff of the gas column, with only scattered locules of gas in the sigmoid colon and rectum.  2.  Given the abrupt cutoff in the descending colon, query underlying diverticulitis, or less likely nonobstructing mass lesion. CT scan of the abdomen and pelvis with IV contrast could further evaluate.  3.  Diffuse osseous metastatic disease  4.  Very low inspiratory volumes with pulmonary vascular congestion and small right pleural effusion   Original Report Authenticated By: Malachy Moan, M.D.     Scheduled Meds:   . ceFEPime (MAXIPIME) IV  2 g Intravenous Q12H  . feeding supplement  1 Container Oral TID BM  . furosemide  40 mg Intravenous Q12H  . levofloxacin (LEVAQUIN) IV  750 mg Intravenous Q24H  . sodium chloride  3 mL Intravenous Q12H  . sodium chloride  3 mL Intravenous Q12H  . vancomycin  1,000 mg Intravenous Q12H   Continuous Infusions:    Principal Problem:  *Abdominal pain Active Problems:  Prostate cancer metastatic to multiple sites  Pleural effusion  UTI (lower urinary tract infection)    Time spent: 30    Anaclara Acklin, [facility]  [facility] Pager [phone]. If 8PM-8AM, please contact night-coverage at [email or web address], password [facility] 02/29/2012, 7:12 PM  LOS: 1 day

## 2012-02-29 NOTE — Progress Notes (Signed)
Echocardiogram 2D Echocardiogram has been performed.  Stephen Stokes 02/29/2012, 3:44 PM

## 2012-02-29 NOTE — Progress Notes (Signed)
ANTIBIOTIC CONSULT NOTE - INITIAL  Pharmacy Consult for vancomycin, levaquin, cefepime Indication:   No Known Allergies  Patient Measurements: Height: 5\' 10"  (177.8 cm) Weight: 249 lb 1.9 oz (113 kg) IBW/kg (Calculated) : 73  Adjusted Body Weight:   Vital Signs: Temp: 98.5 F (36.9 C) (12/19 1759) Temp src: Oral (12/19 1759) BP: 151/90 mmHg (12/20 0145) Pulse Rate: 90  (12/20 0145) Intake/Output from previous day:   Intake/Output from this shift:    Labs:  [facility] 02/28/12 1958  WBC 15.3*  HGB 9.0*  PLT 246  LABCREA --  CREATININE 0.68   Estimated Creatinine Clearance: 131.3 ml/min (by C-G formula based on Cr of 0.68). No results found for this basename: VANCOTROUGH:2,VANCOPEAK:2,VANCORANDOM:2,GENTTROUGH:2,GENTPEAK:2,GENTRANDOM:2,TOBRATROUGH:2,TOBRAPEAK:2,TOBRARND:2,AMIKACINPEAK:2,AMIKACINTROU:2,AMIKACIN:2, in the last 72 hours   Microbiology: Recent Results (from the past 720 hour(s))  URINE CULTURE     Status: Normal   Collection Time   02/10/12  2:11 PM      Component Value Range Status Comment   Specimen Description URINE, CLEAN CATCH   Final    Special Requests NONE   Final    Culture  Setup Time 02/10/2012 16:56   Final    Colony Count NO GROWTH   Final    Culture NO GROWTH   Final    Report Status 02/11/2012 FINAL   Final     Medical History: Past Medical History  Diagnosis Date  . Hypertension   . Diverticulosis     Noted on CT abdomen (06/2010)  . Hyperlipidemia   . Elevated PSA 02/13/12    450.60  . Bone cancer     diffuse T-L spine mets  . 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 Patsi Sears (06/2010) // CT abdomen and pelvis  (06/2010) revealed signficant retroperitoneal and pelvic adenopathy // Bone scan (06/2010) - showed diffuse osseous abnormality  . Hot flashes   . Anxiety     mild new dx    Medications:   (Not in a hospital  admission) Assessment: 56 yo with metastatic prostate ca and pleural effusions and kidney stone. vanc cefepime and levaquin for broad coverage.     Goal of Therapy:  Vancomycin trough level 15-20 mcg/ml  Plan:  Vancomycin 2gm x1 then 1gm q12h cefepime 2gm q12 and levaquin 750mg  q24h   Janice Coffin 02/29/2012,3:43 AM

## 2012-02-29 NOTE — Progress Notes (Signed)
INITIAL NUTRITION ASSESSMENT  DOCUMENTATION CODES Per approved criteria  -Severe malnutrition in the context of acute illness or injury -Obesity Unspecified   INTERVENTION:  Resource Breeze supplement 3 times daily between meals (250 kcals, 9 gm protein per 8 fl oz carton) RD to follow for nutrition care plan  NUTRITION DIAGNOSIS: Inadequate oral intake related to altered GI function as evidenced by PO intake 0%  Goal: Oral intake with meals & supplements to meet >/= 90% of estimated nutrition needs  Monitor:  PO & supplemental intake, weight, labs, I/O's  Reason for Assessment: Malnutrition Screening Tool Report  56 y.o. male  Admitting Dx: Abdominal pain  ASSESSMENT: Patient presented to ER because of increasing abdominal discomfort over the last one week; CT abdomen pelvis revealed dilated colon with no obstruction; CT angiogram of chest showed new bilateral pleural effusion.   Patient reports a poor appetite for approximately 10 days; when RD questioned intake history PTA, patient stated he was eating a lot of "apples & oranges"; endorses an 8 lb weight loss in this time frame; RD to order clear liquid supplement.  Patient meets criteria for severe malnutrition in the context of acute illness or injury given < 50% intake of estimated energy requirement for > 5 days and 3% weight loss in > 1 week.  Height: Ht Readings from Last 1 Encounters:  02/29/12 5\' 10"  (1.778 m)    Weight: Wt Readings from Last 1 Encounters:  02/29/12 240 lb 1.6 oz (108.909 kg)    Ideal Body Weight: 75.4 kg  % Ideal Body Weight: 144%  Wt Readings from Last 10 Encounters:  02/29/12 240 lb 1.6 oz (108.909 kg)  02/20/12 248 lb 14.4 oz (112.9 kg)  02/13/12 248 lb 8 oz (112.719 kg)  01/15/12 254 lb 6.6 oz (115.4 kg)    Usual Body Weight: 248 lb  % Usual Body Weight: 97%  BMI:  Body mass index is 34.45 kg/(m^2).  Estimated Nutritional Needs: Kcal: 2200-2400 Protein: 115-125 gm Fluid:  2.2-2.4 L  Skin: Intact  Diet Order: Clear Liquid  EDUCATION NEEDS: -No education needs identified at this time   Intake/Output Summary (Last 24 hours) at 02/29/12 1123 Last data filed at 02/29/12 1034  Gross per 24 hour  Intake    703 ml  Output      0 ml  Net    703 ml    Last BM: 12/20  Labs:   Lab 02/29/12 0330 02/28/12 1958  NA 133* 130*  K 3.5 3.8  CL 95* 93*  CO2 23 22  BUN 11 13  CREATININE 0.67 0.68  CALCIUM 8.6 8.6  MG -- --  PHOS -- --  GLUCOSE 141* 140*    Scheduled Meds:   . ceFEPime (MAXIPIME) IV  2 g Intravenous Q12H  . furosemide  40 mg Intravenous Q12H  . levofloxacin (LEVAQUIN) IV  750 mg Intravenous Q24H  . sodium chloride  3 mL Intravenous Q12H  . sodium chloride  3 mL Intravenous Q12H  . vancomycin  1,000 mg Intravenous Q12H    Continuous Infusions:   Past Medical History  Diagnosis Date  . Hypertension   . Diverticulosis     Noted on CT abdomen (06/2010)  . Hyperlipidemia   . Elevated PSA 02/13/12    450.60  . Bone cancer     diffuse T-L spine mets  . Prostate cancer 03/2010    Adenocarcinoma of the prostate with obstruction at time of diagnosis // s/p TURP  and bilateral  orchiectomy by Dr. Patsi Sears (06/2010) // CT abdomen and pelvis  (06/2010) revealed signficant retroperitoneal and pelvic adenopathy // Bone scan (06/2010) - showed diffuse osseous abnormality  . Hot flashes   . Anxiety     mild new dx    Past Surgical History  Procedure Date  . Orchiectomy 06/2010    bilateral  . Tonsillectomy   . Transurethral resection of prostate 06/2010    gleason 4+3=7    Kirkland Hun, RD, LDN Pager #: [phone] After-Hours Pager #: [phone]

## 2012-02-29 NOTE — ED Notes (Signed)
Report given to Taylor, RN

## 2012-02-29 NOTE — ED Notes (Signed)
Patient transported to CT 

## 2012-03-01 ENCOUNTER — Inpatient Hospital Stay (HOSPITAL_COMMUNITY): Payer: Medicaid Other

## 2012-03-01 DIAGNOSIS — I10 Essential (primary) hypertension: Secondary | ICD-10-CM

## 2012-03-01 DIAGNOSIS — C61 Malignant neoplasm of prostate: Secondary | ICD-10-CM

## 2012-03-01 LAB — COMPREHENSIVE METABOLIC PANEL
ALT: 111 U/L — ABNORMAL HIGH (ref 0–53)
AST: 48 U/L — ABNORMAL HIGH (ref 0–37)
Albumin: 3.1 g/dL — ABNORMAL LOW (ref 3.5–5.2)
Alkaline Phosphatase: 388 U/L — ABNORMAL HIGH (ref 39–117)
BUN: 11 mg/dL (ref 6–23)
CO2: 25 mEq/L (ref 19–32)
Calcium: 8.8 mg/dL (ref 8.4–10.5)
Chloride: 94 mEq/L — ABNORMAL LOW (ref 96–112)
Creatinine, Ser: 0.65 mg/dL (ref 0.50–1.35)
GFR calc Af Amer: >90 mL/min (ref >90)
GFR calc non Af Amer: >90 mL/min (ref >90)
Glucose, Bld: 153 mg/dL — ABNORMAL HIGH (ref 70–99)
Potassium: 3.2 mEq/L — ABNORMAL LOW (ref 3.5–5.1)
Sodium: 137 mEq/L (ref 135–145)
Total Bilirubin: 0.5 mg/dL (ref 0.3–1.2)
Total Protein: 8 g/dL (ref 6.0–8.3)

## 2012-03-01 LAB — CBC
HCT: 29.1 % — ABNORMAL LOW (ref 39.0–52.0)
Hemoglobin: 9 g/dL — ABNORMAL LOW (ref 13.0–17.0)
MCH: 26.6 pg (ref 26.0–34.0)
MCHC: 30.9 g/dL (ref 30.0–36.0)
MCV: 86.1 fL (ref 78.0–100.0)
Platelets: 274 10*3/uL (ref 150–400)
RBC: 3.38 MIL/uL — ABNORMAL LOW (ref 4.22–5.81)
RDW: 16.5 % — ABNORMAL HIGH (ref 11.5–15.5)
WBC: 14.2 10*3/uL — ABNORMAL HIGH (ref 4.0–10.5)

## 2012-03-01 MED ORDER — SENNA 8.6 MG PO TABS: 1.0000 | ORAL_TABLET | Freq: Every evening | ORAL | Status: DC | PRN

## 2012-03-01 MED ORDER — POTASSIUM CHLORIDE CRYS ER 20 MEQ PO TBCR: 20.0000 meq | EXTENDED_RELEASE_TABLET | Freq: Two times a day (BID) | ORAL | Status: DC

## 2012-03-01 MED ORDER — POLYETHYLENE GLYCOL 3350 17 G PO PACK: 17.0000 g | PACK | Freq: Every day | ORAL | Status: DC

## 2012-03-01 MED ORDER — FUROSEMIDE 40 MG PO TABS: 40.0000 mg | ORAL_TABLET | Freq: Two times a day (BID) | ORAL | Status: DC

## 2012-03-01 MED ORDER — PROMETHAZINE HCL 12.5 MG PO TABS: 12.5000 mg | ORAL_TABLET | Freq: Four times a day (QID) | ORAL | Status: DC | PRN

## 2012-03-01 MED ORDER — POTASSIUM CHLORIDE CRYS ER 20 MEQ PO TBCR
40.0000 meq | EXTENDED_RELEASE_TABLET | Freq: Once | ORAL | Status: AC
  Administered 2012-03-01: 40 meq via ORAL
  Filled 2012-03-01: qty 2

## 2012-03-01 NOTE — Progress Notes (Signed)
Pt ambulated 369ft on room air sats no lower than 93%, HR tachy in 110s-120s. Pt returned to bed, call bell within reach. Will continue to monitor.

## 2012-03-01 NOTE — Discharge Summary (Signed)
Physician Discharge Summary  Mylik Pro ZOX:[id number] DOB: [birth year] DOA: 02/28/2012  PCP: Burtis Junes, MD  Admit date: 02/28/2012 Discharge date: 03/01/2012  Recommendations for Outpatient Follow-up:  1. Urology for follow up of kidney stone  2. PCP for further evaluation of bilateral pleural effusions, follow up of urine culture, evaluation of transaminitis, blood pressure check.  CMP, CBC at follow up appointment.    Discharge Diagnoses:  Principal Problem:  *Abdominal pain Active Problems:  Prostate cancer metastatic to multiple sites  Pleural effusion  UTI (lower urinary tract infection)   Discharge Condition: stable, improved  Diet recommendation: healthy heart  Wt Readings from Last 3 Encounters:  03/01/12 105.416 kg (232 lb 6.4 oz)  02/20/12 112.9 kg (248 lb 14.4 oz)  02/13/12 112.719 kg (248 lb 8 oz)    History of present illness:   56 year-old male with history of metastatic prostate cancer, hypertension presents to the ER because of increasing abdominal discomfort over the last one week. Patient states that he's been having discomfort all over his abdomen with distention. Patient has not had any nausea vomiting. Patient has not moved his bowels for last one week. Denies any fever chills. In addition patient has been experiencing shortness of breath for the same period of time. Denies any productive cough or chest pain. In the ER patient had CT abdomen and pelvis and CT angiogram of the chest. CT abdomen pelvis reveals dilated colon with no obstruction. In addition also shows left-sided perinephric stranding with stone in the renal pelvis. CT angiogram of the chest shows no PE but does show new bilateral pleural effusion.   Hospital Course:   Abdominal discomfort and distention - may have been due to narcotic induced ileus and completely resolved after a soap suds enema. Bowel sounds improved and the patient was started on a bowel regimen with senna,  colace, and miralax.  He should eat a high fiber diet.    Possible UTI with left perinephric stranding and renal stone - Dr. Vernie Ammons was consulted by the ER physician. Since the stone is high up in the pelvis they advised to observation and followup as outpatient. Urine culture is pending at the time of discharge, but no culture was added initially because patient did not have LE, nitrites, or WBC in his initial UA.    Shortness of breath:  Patient states this is a chronic problem.  He has new bilateral pleural effusions and infiltrate versus atelectasis on his CTa.  He also may have had some difficulty with diaphragmatic excursion from his abdominal distension.  He was started on vancomycin and cefepime for HCAP secondary to hospitalization in November.  He was diuresed with IV lasix 40mg  BID due to grade 1 diastolic dysfunction on ECHO.  He was quickly weaned to room air.  Follow up CXR demonstrated improved aeration in the lung bases, improved effusions, small amount of residual left lower lobe atelectasis.  His antibiotics were discontinued because of fast improvement in respiratory status, improved CXR and patient afebrile.  He should continue lasix with potassium for three more days, then follow up with his primary care doctor in approximately 1 week.    Hypertension - presently patient does not take any blood pressure medication. He was placed on as needed IV hydralazine for systolic blood pressure was .  His blood pressure trended in the 140s in the setting of acute illness.  He should have his blood pressure rechecked by his primary care doctor when he is feeling at  his baseline to determine if he needs treatment.   Leukocytosis - Patient has a chronically elevated WBC in the mid to high teens.    Anemia, normocytic - Stool for occult blood was negative.  Metastatic prostate cancer - per oncology.   Patient received three contrasted studies within 48 hours and was concomitantly diuresed.  Risk for CIN, however, his creatinine remained stable.     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 Patient was asymptomatic and trended up Transaminitis: Trending down. Unclear etiology.  No elevated of bilirubin to suggest obstruction or stone and CT abdomen did not demonstrate any liver or gallbladder abnormalities.  Patient should follow up with his primary care doctor within 1 week for repeat BMP, LFTs, and CBC.    Alk phos chronically elevated due to metastatic prostate cancer.    Consultants:  None Procedures:  CT angio chest  CT abd/pelvis with contrast  CXR/KUB Antibiotics:  Vancomycin 12/20 >> 12/21 Cefepime 12/20 >> 12/21 Levofloxacin 12/20, then stopped  Discharge Exam: Filed Vitals:   03/01/12 0420  BP: 143/91  Pulse: 77  Temp: 99.3 F (37.4 C)  Resp: 19   Filed Vitals:   02/29/12 0500 02/29/12 1335 02/29/12 2010 03/01/12 0420  BP:  146/99 147/89 143/91  Pulse:  90 86 77  Temp:  99.2 F (37.3 C) 98.6 F (37 C) 99.3 F (37.4 C)  TempSrc:  Oral Oral Oral  Resp:  17 18 19   Height:      Weight: 108.909 kg (240 lb 1.6 oz)   105.416 kg (232 lb 6.4 oz)  SpO2:  92% 97% 94%   States that he feels well.  States his breathing is at baseline.    General: Obese AAM, no acute distress, sitting eating breakfast HEENT: MMM  Cardiovascular: RRR, no murmurs, rubs, or gallops  Respiratory:  Faint rales at the bilateral bases and mildly diminished at the bases Abdomen: NABS, moderately distended, nontender  MSK: No LEE  Discharge Instructions      Discharge Orders    Future Appointments: Provider: Department: Dept Phone: Center:   03/18/2012 2:00 PM [facility] [phone] None   03/20/2012 3:30 PM Benjiman Core, MD [facility] [phone] None     Future Orders Please Complete By Expires   Diet - low sodium heart healthy      Increase activity slowly      Discharge  instructions      Comments:   You were hospitalized with abdominal pain and shortness of breath.  You were started on antibiotics for possible pyelonephritis and for health care associated pneumonia, however, your urinalysis did not show signs of infection and your repeat chest x-ray did not demonstrate pneumonia.  You received two days of antibiotics and we will stop these for now.  If you develop symptoms of infection, such as fevers, chills, nausea, vomiting, increased shortness of breath, pain with urination, please seek medical attention.  You will need to follow up with urology regarding your kidney stone.  You also had some pleural effusions, or fluid around the lungs.  You were given lasix and your effusions began to improve.  You should continue lasix two times per day for the next three days with potassium two times daily for the next 3 days to continue to improve your breathing.  You may take your next dose tomorrow morning.  The extra fluid may be due to some diastolic heart failure.  See below on instructions for weighing yourself daily.  You had some constipation due to your narcotics.  Please use colace, senna, and miralax to prevent constipation and eat a high fiber diet.  Finally, your liver enzymes were elevated, which is new.  Please talk to your primary care doctor about this.  Your CT scan did not show any abnormalities with your liver or your gallbladder, but no further testing was done at this time.   Call MD for:  temperature >100.4      Call MD for:  persistant nausea and vomiting      Call MD for:  severe uncontrolled pain      Call MD for:  hives      Call MD for:  difficulty breathing, headache or visual disturbances      Call MD for:  persistant dizziness or light-headedness      Call MD for:  extreme fatigue      (HEART FAILURE PATIENTS) Call MD:  Anytime you have any of the following symptoms: 1) 3 pound weight gain in 24 hours or 5 pounds in 1 week 2) shortness of breath,  with or without a dry hacking cough 3) swelling in the hands, feet or stomach 4) if you have to sleep on extra pillows at night in order to breathe.          Medication List     As of 03/01/2012  4:09 PM    TAKE these medications         cholecalciferol 1000 UNITS tablet   Commonly known as: VITAMIN D   Take 1,000 Units by mouth daily.      docusate sodium 100 MG capsule   Commonly known as: COLACE   Take 100 mg by mouth 2 (two) times daily.      furosemide 40 MG tablet   Commonly known as: LASIX   Take 1 tablet (40 mg total) by mouth 2 (two) times daily.      HYDROmorphone 4 MG tablet   Commonly known as: DILAUDID   Take 1 tablet (4 mg total) by mouth every 4 (four) hours as needed for pain.      oxyCODONE 5 MG immediate release tablet   Commonly known as: Oxy IR/ROXICODONE   Take 5-10 mg by mouth every 4 (four) hours as needed. For pain      polyethylene glycol packet   Commonly known as: MIRALAX / GLYCOLAX   Take 17 g by mouth daily.      potassium chloride SA 20 MEQ tablet   Commonly known as: K-DUR,KLOR-CON   Take 1 tablet (20 mEq total) by mouth 2 (two) times daily.      promethazine 12.5 MG tablet   Commonly known as: PHENERGAN   Take 1 tablet (12.5 mg total) by mouth every 6 (six) hours as needed for nausea.      senna 8.6 MG Tabs   Commonly known as: SENOKOT   Take 1 tablet (8.6 mg total) by mouth at bedtime as needed.        Follow-up Information    Follow up with [facility]. Schedule an appointment as soon as possible for a visit in 2 weeks.   Contact information:   [address] [place] [address] [place] [address] [phone]      Follow up with Burtis Junes, MD. Schedule an appointment as soon as possible for a visit in 1 week.   Contact information:   [address] [address] [facility] [id number] [phone]           The results of significant diagnostics from this hospitalization (including imaging,  microbiology, ancillary and laboratory) are listed below for reference.    Significant Diagnostic Studies: Dg Lumbar Spine Complete  02/10/2012  *RADIOLOGY REPORT*  Clinical Data: Low back pain prostate cancer  LUMBAR SPINE - COMPLETE 4+ VIEW  Comparison: 12/16/2011  Findings: Osseous sclerotic metastatic disease is present throughout the pelvis and lumbar spine, similar to the prior study. No fracture is identified.  Mild disc degeneration is unchanged.  IMPRESSION: Sclerotic bony metastatic disease without fracture.  No interval change.   Original Report Authenticated By: Janeece Riggers, M.D.    Ct Angio Chest Pe W/cm &/or Wo Cm  02/29/2012  *RADIOLOGY REPORT*  Clinical Data: Abdominal pain, shortness of breath.  CT ANGIOGRAPHY CHEST  Technique:  Multidetector CT imaging of the chest using the standard protocol during bolus administration of intravenous contrast. Multiplanar reconstructed images including MIPs were obtained and reviewed to evaluate the vascular anatomy.  Contrast: OMNIPAQUE IOHEXOL 350 MG/ML SOLN  Comparison: 02/28/2012 radiograph  Findings: No main branch pulmonary embolism.  Lobar and more peripheral branches are degraded by motion and contrast bolus timing.  Normal caliber aorta with mild scattered atherosclerosis. Cardiomegaly.  Coronary artery calcification.  No pericardial effusion.  Bilateral pleural effusions.  Associated airspace consolidations.  Mild areas of ground-glass opacity. Detailed lung parenchymal evaluation degraded by motion.  No intrathoracic lymphadenopathy.  Central airways are grossly patent.  Limited images through the upper abdomen show no acute finding.  Osseous sclerotic metastases.  No acute fracture identified.  IMPRESSION: No main branch pulmonary embolism identified. Lobar and more peripheral branches are poorly evaluated due to respiratory motion and suboptimal contrast bolus timing.  Cardiomegaly with coronary artery calcification.  Bilateral pleural  effusions with associated airspace consolidations; atelectasis versus infiltrate.  Mild ground-glass opacities may reflect edema.  Sclerotic osseous metastases.   Original Report Authenticated By: Jearld Lesch, M.D.    Mr Lumbar Spine W Wo Contrast  02/27/2012  *RADIOLOGY REPORT*  Clinical Data: Metastatic prostate cancer.  Worsening back pain.  MRI LUMBAR SPINE WITHOUT AND WITH CONTRAST  Technique:  Multiplanar and multiecho pulse sequences of the lumbar spine were obtained without and with intravenous contrast.  Contrast: 20mL MULTIHANCE GADOBENATE DIMEGLUMINE 529 MG/ML IV SOLN  Comparison: 02/10/2012 radiographs.  Findings: The numbering convention used for this exam terms L5-S1 as the last full intervertebral disc space above the sacrum. Diffuse osseous metastatic disease, presumably representing metastatic prostate cancer.  Biconcave L3 compression fractures present with 25% loss of vertebral body height.  No retropulsion. Spinal cord terminates posterior to the L1-L2 interspace.  There is presacral and paraspinal edema and enhancement, likely reactive to metastatic disease.  No displaced sacral fracture is identified. Right renal cyst is present.  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  34 mm infrarenal abdominal aortic aneurysm.  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  There is dural thickening and enhancement in the inferior lumbar spine, suspicious for dural spread of tumor (image number 32 series 9).  No definite intrathecal metastatic disease.  There is clumping of the nerve roots in the thecal sac with an appearance suggestive of arachnoiditis.  Superimposed degenerative disc disease is mild.  This is most pronounced at L5-S1, where there is disc desiccation and broad- based posterior disc bulging without resulting stenosis.  There is no central canal compromise associated with extraosseous  extension of tumor.  Posterior element vertebral invasion is most  pronounced at L3.  IMPRESSION: 1.  Extensive diffuse metastatic disease to the entire visualized axial skeleton.  Pathologic compression fracture of L3 with 25% loss of vertebral body height and no retropulsion.  No central or foraminal stenosis due to extraosseous extension of tumor. 2.  Dural thickening enhancement compatible with dural spread of metastatic disease in this patient with prostate cancer. 3.  34 mm infrarenal abdominal aortic aneurysm appears similar to prior exam.   Original Report Authenticated By: Andreas Newport, M.D.    Ct Abdomen Pelvis W Contrast  02/28/2012  *RADIOLOGY REPORT*  Clinical Data: Abdominal pain and constipation. History of metastatic prostate cancer.  CT ABDOMEN AND PELVIS WITH CONTRAST  Technique:  Multidetector CT imaging of the abdomen and pelvis was performed following the standard protocol during bolus administration of intravenous contrast.  Contrast: OMNIPAQUE IOHEXOL 300 MG/ML  SOLN  Comparison: CT abdomen pelvis 05/23/2010  Findings:  Lung bases:  There are small to moderate appearing bilateral pleural effusions at the lung bases.  The pleural effusions are not completely imaged.  There is bibasilar collapse and/or consolidation.  Cardiomegaly is present.  There is an elongate stone filling the left renal pelvis and extending to the left ureteropelvic junction/proximal left ureter. This stone measures at least 2.4 x 1.3 cm and results in mild hydronephrosis of the left kidney.  A separate stone is seen in the lower pole of the left renal collecting system, measuring 6 mm. There is stranding around the left renal pelvis and proximal left ureter.  Beyond this obstructing stone, the remainder of the left ureter is completely decompressed.  There is a stable cyst in the posterior right kidney.  There is no hydronephrosis or stone on the right.  The right ureter is normal in caliber.  Urinary bladder appears within normal limits.  The prostate gland appears smaller  on today's study compared to prior.  Left pelvic sidewall lymph node has decreased in size to 1.8 x 1.3 cm (previously 3.0 x 2.3 cm in March 2012).  Right pelvic sidewall lymph node on image 78 measures 5 x 8 mm (previously 15 x 20 mm).  There is a prominent amount of stool in the cecum, ascending colon and hepatic flexure of the colon. There is a lesser degree of stool in the distal sigmoid colon and rectum. There is mild diffuse gaseous distention of the colon.  No evidence of bowel obstruction. There are scattered colonic diverticula, most prominent in the descending and sigmoid regions.  The appendix is normal.  Small bowel loops are normal in caliber wall thickness.  The stomach contains a small diverticulum extending from the fundus, and is otherwise unremarkable.  The liver, gallbladder, spleen, gallbladder and pancreas are within normal limits.  There is atherosclerotic disease of the abdominal aorta with infrarenal abdominal aortic ectasia measuring up to the 3 cm AP diameter, stable.  There is stable ectasia of the left common iliac artery.  Innumerable sclerotic and lucent lesions in the imaged portion of the skeleton are consistent with the patient's known history of diffuse bony metastatic disease.  Overall, the degree of bony metastatic disease appears slightly improved compared to the CT of March 2012.  No pathologic fracture is identified.  IMPRESSION:  1.  Large stone in the left renal pelvis extending into the ureteropelvic junction/proximal left ureter results in mild left hydronephrosis and inflammatory stranding around the left renal pelvis. 2.  There is  some gaseous distention of the colon, but no evidence of bowel obstruction.  Moderate amount of stool in the proximal colon.  Question if the patient could have clinical constipation. 3.  Small to moderate bilateral pleural effusions with overlying collapse and/or consolidation of the lower lobes.  These pleural effusions are new compared to  prior CT of March 2012. 4.  Positive response to therapy compared to prior CT of 2012 evidenced by decreasing lymphadenopathy in the pelvis, decreased size of the prostate gland and some improvement in the overall appearance of the patient's diffuse bony metastatic disease. 5.  Colonic diverticulosis. 6.  Stable aorta and left common iliac ectasia.   Original Report Authenticated By: Britta Mccreedy, M.D.    Dg Abd Acute W/chest  02/28/2012  *RADIOLOGY REPORT*  Clinical Data: Abdominal pain, constipation  ACUTE ABDOMEN SERIES (ABDOMEN 2 VIEW & CHEST 1 VIEW)  Comparison: Abdominal radiograph 02/10/2012; chest x-ray 01/14/2012  Findings: Very low inspiratory volumes with increased pulmonary vascular congestion and mild edema.  There is a right pleural effusion with fluid tracking in the minor fissure.  Cardiomegaly appears similar to prior given the low inspiratory volumes. Gaseous dilatation of the to the level of the mid descending colon. A small amount of gas and stool noted in the sigmoid colon and rectum.  Fairly large volume of formed stool in the cecum.  Diffuse areas of patchy sclerosis throughout the visualized skeleton consistent with known metastatic disease.  IMPRESSION:  1.  The cecum is moderately distended with stool.  There is gaseous distension of the colon to the mid descending colon where there is an abrupt cutoff of the gas column, with only scattered locules of gas in the sigmoid colon and rectum.  2.  Given the abrupt cutoff in the descending colon, query underlying diverticulitis, or less likely nonobstructing mass lesion. CT scan of the abdomen and pelvis with IV contrast could further evaluate.  3.  Diffuse osseous metastatic disease  4.  Very low inspiratory volumes with pulmonary vascular congestion and small right pleural effusion   Original Report Authenticated By: Malachy Moan, M.D.     Microbiology: No results found for this or any previous visit (from the past 240 hour(s)).    Labs: Basic Metabolic Panel:  Lab 03/01/12 8295 02/29/12 0330 02/28/12 1958  NA 137 133* 130*  K 3.2* 3.5 3.8  CL 94* 95* 93*  CO2 25 23 22   GLUCOSE 153* 141* 140*  BUN 11 11 13   CREATININE 0.65 0.67 0.68  CALCIUM 8.8 8.6 8.6  MG -- -- --  PHOS -- -- --   Liver Function Tests:  Lab 03/01/12 0834 02/29/12 0330 02/28/12 1958  AST 48* 53* 63*  ALT 111* 118* 128*  ALKPHOS 388* 372* 369*  BILITOT 0.5 0.7 0.6  PROT 8.0 7.9 7.9  ALBUMIN 3.1* 3.1* 3.0*    Lab 02/28/12 1958  LIPASE 18  AMYLASE --   No results found for this basename: AMMONIA:5 in the last 168 hours CBC:  Lab 03/01/12 0834 02/29/12 0330 02/28/12 1958  WBC 14.2* 16.3* 15.3*  NEUTROABS -- 12.7* --  HGB 9.0* 8.9* 9.0*  HCT 29.1* 27.8* 28.5*  MCV 86.1 85.5 85.3  PLT 274 261 246   Cardiac Enzymes:  Lab 02/29/12 0329  CKTOTAL --  CKMB --  CKMBINDEX --  TROPONINI <0.30   BNP: BNP (last 3 results)  Basename 02/29/12 0329 01/14/12 0933  PROBNP 40.3 12.6   CBG:  Lab 02/29/12 1638  GLUCAP 159*  Time coordinating discharge: 45 minutes  Signed:  Brayan Votaw  [facility] 03/01/2012, 4:09 PM

## 2012-03-03 LAB — URINE CULTURE

## 2012-03-03 NOTE — ED Provider Notes (Signed)
History/physical exam/procedure(s) were performed by non-physician practitioner and as supervising physician I was immediately available for consultation/collaboration. I have reviewed all notes and am in agreement with care and plan.   Emmalynne Courtney S Derian Dimalanta, MD 03/03/12 0714 

## 2012-03-04 ENCOUNTER — Ambulatory Visit
Admission: RE | Admit: 2012-03-04 | Discharge: 2012-03-04 | Disposition: A | Discharge status: Home / Self Care | Payer: Medicaid Other | Source: Ambulatory Visit | Attending: Radiation Oncology | Admitting: Radiation Oncology

## 2012-03-04 ENCOUNTER — Telehealth: Payer: Self-pay | Admitting: Unknown Physician Specialty

## 2012-03-04 DIAGNOSIS — R197 Diarrhea, unspecified: Secondary | ICD-10-CM | POA: Insufficient documentation

## 2012-03-04 DIAGNOSIS — M79609 Pain in unspecified limb: Secondary | ICD-10-CM | POA: Insufficient documentation

## 2012-03-04 DIAGNOSIS — Z510 Encounter for antineoplastic radiation therapy: Secondary | ICD-10-CM | POA: Insufficient documentation

## 2012-03-04 DIAGNOSIS — C7951 Secondary malignant neoplasm of bone: Secondary | ICD-10-CM

## 2012-03-04 DIAGNOSIS — Z79899 Other long term (current) drug therapy: Secondary | ICD-10-CM | POA: Insufficient documentation

## 2012-03-04 DIAGNOSIS — R209 Unspecified disturbances of skin sensation: Secondary | ICD-10-CM | POA: Insufficient documentation

## 2012-03-04 DIAGNOSIS — R4789 Other speech disturbances: Secondary | ICD-10-CM | POA: Insufficient documentation

## 2012-03-04 DIAGNOSIS — C61 Malignant neoplasm of prostate: Secondary | ICD-10-CM | POA: Insufficient documentation

## 2012-03-04 NOTE — Progress Notes (Signed)
  Radiation Oncology         ([phone]) [phone] ________________________________  Name: Stephen Stokes MRN: [id number]  Date: 03/04/2012  DOB: [birth year]  SIMULATION AND TREATMENT PLANNING NOTE  DIAGNOSIS:  Metastatic prostate cancer  NARRATIVE:  The patient was brought to the CT Simulation planning suite.  Identity was confirmed.  All relevant records and images related to the planned course of therapy were reviewed.   Written consent to proceed with treatment was confirmed which was freely given after reviewing the details related to the planned course of therapy had been reviewed with the patient.  Then, the patient was set-up in a stable reproducible  supine position for radiation therapy.  CT images were obtained.  Surface markings were placed.    The CT images were loaded into the planning software.  Then the target and avoidance structures were contoured.  The patient will receive treatment to the lumbar spine. Treatment planning then occurred.  The radiation prescription was entered and confirmed.  A total of 6 complex treatment devices were fabricated which relate to the designed radiation treatment fields. Each of these customized fields/ complex treatment devices will be used on a daily basis during the radiation course. The patient will be treated with a half-beam block technique. This will allow maximum sparing of the kidneys more superiorly as well as the bowel more inferiorly. I have requested : 3D Simulation  I have requested a DVH of the following structures: clinical tumor volume, left kidney, right kidney, bowel.   PLAN:  The patient will receive 30 Gy in 10 fractions.  ________________________________   Radene Gunning, MD, PhD

## 2012-03-04 NOTE — Telephone Encounter (Signed)
Met w patient today to discuss RO billing. Pt advised MCD is pending per deductible status, in which he has met w his inpt stay. However, his DSS caseworker told him that his application  is still pending at this time. Pt has completed the EPP application and is bring back verification(s) for approval. Pt states his SSI is apprx 1068 a month w one household income.  Dx: 185; 199 Prostate and Bone Mets  Attending Rad: JM  Rad Tx: Daily

## 2012-03-10 ENCOUNTER — Ambulatory Visit: Payer: Medicaid Other | Admitting: Radiation Oncology

## 2012-03-11 ENCOUNTER — Encounter: Payer: Self-pay | Admitting: Radiation Oncology

## 2012-03-11 ENCOUNTER — Ambulatory Visit
Admission: RE | Admit: 2012-03-11 | Discharge: 2012-03-11 | Disposition: A | Discharge status: Home / Self Care | Payer: Medicaid Other | Source: Ambulatory Visit | Attending: Radiation Oncology | Admitting: Radiation Oncology

## 2012-03-13 ENCOUNTER — Ambulatory Visit
Admission: RE | Admit: 2012-03-13 | Discharge: 2012-03-13 | Disposition: A | Discharge status: Home / Self Care | Payer: Medicaid Other | Source: Ambulatory Visit | Attending: Radiation Oncology | Admitting: Radiation Oncology

## 2012-03-13 ENCOUNTER — Telehealth: Payer: Self-pay | Admitting: Radiation Oncology

## 2012-03-13 ENCOUNTER — Encounter: Payer: Self-pay | Admitting: Radiation Oncology

## 2012-03-13 NOTE — Progress Notes (Signed)
Patient presented for radiation treatment to spine today. Patient requesting refill on Dilaudid script provided in the ED. Patient reports constant aching upper back pain 5 on a scale of 0-10. Dr. Roselind Messier refilled hydromorphone script.

## 2012-03-13 NOTE — Telephone Encounter (Signed)
INDIGENT APPROVED 100% FAMILY SIZE: 1 HH INC: [id number] MOD POV: 11,[phone] - [phone] VALID DATES: 03/13/2012 - 09/10/2012 100% INDIGENT - PLEASE APPLY DISCOUNT TO ANY  6 MONTHS PRIOR AND ALL CURRENT BILL.  [facility] $400  NOTE - Medicaid is pending deduct status at this time.

## 2012-03-14 ENCOUNTER — Encounter: Payer: Self-pay | Admitting: Radiation Oncology

## 2012-03-14 ENCOUNTER — Ambulatory Visit
Admission: RE | Admit: 2012-03-14 | Discharge: 2012-03-14 | Disposition: A | Discharge status: Home / Self Care | Payer: Medicaid Other | Source: Ambulatory Visit | Attending: Radiation Oncology | Admitting: Radiation Oncology

## 2012-03-14 VITALS — BP 137/95 | HR 108 | Temp 97.8°F | Resp 20 | Wt 230.8 lb

## 2012-03-14 DIAGNOSIS — C7952 Secondary malignant neoplasm of bone marrow: Secondary | ICD-10-CM

## 2012-03-14 DIAGNOSIS — C7951 Secondary malignant neoplasm of bone: Secondary | ICD-10-CM

## 2012-03-14 NOTE — Progress Notes (Signed)
Patient here weekly rad txs, c/o diarrhea 10x yesterday, taking stool softners still, suggested to stop that and take imodium, last food eaten Tuesday= Oxtail,  Pinto beans, drinking apple juice only, also, pateint education, radiation therapy and yoyu book given, had dilaudid rx given yesterday hasn't picked up yet, also not taking his phenergan for nausea, took ortho stat vitals, sitting= t=97.4, b/p=132/87,p=105,rr=20,., standing b/p=137/95,p=108 Also not sleeping at night, stated, suggested  To  Use a low residue diet,  And brat diet, pain level in back a 4 on 1-10 scale, stated t hip  8 on a 1-10 scale Where he had his wallet back paocket yesterday 10:40 AM  10:39 AM

## 2012-03-14 NOTE — Progress Notes (Signed)
  Radiation Oncology         [phone] ________________________________  Name: Stephen [facility] MRN: [id number]  Date: 03/14/2012  DOB: 1955/03/25  Weekly Radiation Therapy Management  Current Dose: 9 Gy     Planned Dose:  30 Gy  Narrative . . . . . . . . The patient presents for routine under treatment assessment.  Patient here weekly rad txs, c/o diarrhea 10x yesterday, taking stool softners still, suggested to stop that and take imodium, last food eaten Tuesday= Oxtail, Pinto beans, drinking apple juice only, also, pateint education, radiation therapy and you book given, had dilaudid rx given yesterday hasn't picked up yet, also not taking his phenergan for nausea, Also not sleeping at night, stated, suggested To Use a low residue diet, And brat diet, pain level in back a 4 on 1-10 scale, stated t hip 8 on a 1-10 scale  Where he had his wallet back paocket yesterday                                 Set-up films were reviewed.                                 The chart was checked. Physical Findings. . .  weight is 230 lb 12.8 oz (104.69 kg). His oral temperature is 97.8 F (36.6 C). His blood pressure is 137/95 and his pulse is 108. His respiration is 20. Marland Kitchen took ortho stat vitals, sitting= t=97.4, b/p=132/87,p=105,rr=20,., standing b/p=137/95,p=108 Weight essentially stable.  No significant changes. Impression . . . . . . . The patient is  tolerating radiation. Plan . . . . . . . . . . . . Continue treatment as planned.  Offered IV fluids.  ________________________________  Artist Pais Kathrynn Running, M.D.

## 2012-03-17 ENCOUNTER — Ambulatory Visit
Admission: RE | Admit: 2012-03-17 | Discharge: 2012-03-17 | Disposition: A | Discharge status: Home / Self Care | Payer: Medicaid Other | Source: Ambulatory Visit | Attending: Radiation Oncology | Admitting: Radiation Oncology

## 2012-03-18 ENCOUNTER — Ambulatory Visit
Admission: RE | Admit: 2012-03-18 | Discharge: 2012-03-18 | Disposition: A | Discharge status: Home / Self Care | Payer: Medicaid Other | Source: Ambulatory Visit | Attending: Radiation Oncology | Admitting: Radiation Oncology

## 2012-03-18 ENCOUNTER — Other Ambulatory Visit (HOSPITAL_BASED_OUTPATIENT_CLINIC_OR_DEPARTMENT_OTHER): Payer: Self-pay

## 2012-03-18 DIAGNOSIS — C61 Malignant neoplasm of prostate: Secondary | ICD-10-CM

## 2012-03-18 DIAGNOSIS — C800 Disseminated malignant neoplasm, unspecified: Secondary | ICD-10-CM

## 2012-03-18 LAB — CBC WITH DIFFERENTIAL/PLATELET
BASO%: 0.2 % (ref 0.0–2.0)
EOS%: 0.5 % (ref 0.0–7.0)
MCH: 27 pg — ABNORMAL LOW (ref 27.2–33.4)
MCHC: 31.7 g/dL — ABNORMAL LOW (ref 32.0–36.0)
MCV: 85.1 fL (ref 79.3–98.0)
MONO%: 9.2 % (ref 0.0–14.0)
RBC: 3.22 10*6/uL — ABNORMAL LOW (ref 4.20–5.82)
RDW: 19.8 % — ABNORMAL HIGH (ref 11.0–14.6)

## 2012-03-18 LAB — COMPREHENSIVE METABOLIC PANEL (CC13)
ALT: 62 U/L — ABNORMAL HIGH (ref 0–55)
AST: 34 U/L (ref 5–34)
Albumin: 3.1 g/dL — ABNORMAL LOW (ref 3.5–5.0)
Alkaline Phosphatase: 349 U/L — ABNORMAL HIGH (ref 40–150)
BUN: 12 mg/dL (ref 7.0–26.0)
Potassium: 3.7 mEq/L (ref 3.5–5.1)
Sodium: 140 mEq/L (ref 136–145)

## 2012-03-19 ENCOUNTER — Ambulatory Visit
Admission: RE | Admit: 2012-03-19 | Discharge: 2012-03-19 | Disposition: A | Discharge status: Home / Self Care | Payer: Medicaid Other | Source: Ambulatory Visit | Attending: Radiation Oncology | Admitting: Radiation Oncology

## 2012-03-20 ENCOUNTER — Ambulatory Visit (HOSPITAL_BASED_OUTPATIENT_CLINIC_OR_DEPARTMENT_OTHER): Payer: Self-pay | Admitting: Oncology

## 2012-03-20 ENCOUNTER — Telehealth: Payer: Self-pay | Admitting: Oncology

## 2012-03-20 ENCOUNTER — Ambulatory Visit
Admission: RE | Admit: 2012-03-20 | Discharge: 2012-03-20 | Disposition: A | Discharge status: Home / Self Care | Payer: Medicaid Other | Source: Ambulatory Visit | Attending: Radiation Oncology | Admitting: Radiation Oncology

## 2012-03-20 VITALS — BP 149/96 | HR 98 | Temp 96.7°F | Resp 20 | Wt 226.4 lb

## 2012-03-20 DIAGNOSIS — C61 Malignant neoplasm of prostate: Secondary | ICD-10-CM

## 2012-03-20 DIAGNOSIS — C7952 Secondary malignant neoplasm of bone marrow: Secondary | ICD-10-CM

## 2012-03-20 DIAGNOSIS — R52 Pain, unspecified: Secondary | ICD-10-CM

## 2012-03-20 NOTE — Telephone Encounter (Signed)
Gave pt appt for 04/22/12 lab and MD , date per patient

## 2012-03-20 NOTE — Progress Notes (Signed)
Hematology and Oncology Follow Up Visit  Stephen Stokes [id number] [birth year] 57 y.o. 03/20/2012 4:19 PM   Principle Diagnosis: 57 year old with prostate cancer diagnosed in 03/2010. He presented with a PSA close to 1400 and back pain  And advanced disease.  Gleason score was 4 + 3 equals 7.   Prior Therapy: He underwent a transurethral resection of the prostate procedure and bilateral orchiectomy. His Gleason score was 4 + 3 equals 7. Most recently he is developing a rise in  his PSA despite castrate level of testosterone.  Current therapy: He is on Casodex as combined androgen deprivation.   He is finishing palliative radiation therapy he will complete 30 Gy next week.   Interim History:  [facility] presents today for a follow up visit. He is a nice man with the above diagnosis. He is getting palliative radiation therapy to the spine. He has been doing poorly overall. He report pain in his lower back and leg with dilaudid is helping.  His mobility is slow and uses a cane at home. His weight is down as well.  He reports no chest pain or any other discomfort.   Medications: I have reviewed the patient's current medications. Current outpatient prescriptions:cholecalciferol (VITAMIN D) 1000 UNITS tablet, Take 1,000 Units by mouth daily., Disp: , Rfl: ;  docusate sodium (COLACE) 100 MG capsule, Take 100 mg by mouth 2 (two) times daily., Disp: , Rfl: ;  furosemide (LASIX) 40 MG tablet, Take 1 tablet (40 mg total) by mouth 2 (two) times daily., Disp: 6 tablet, Rfl: 0 HYDROmorphone (DILAUDID) 4 MG tablet, Take 4 mg by mouth every 4 (four) hours as needed. Script refilled by [facility] 03/13/2012 qty 30 no refills original script given by ED provider, Disp: , Rfl: ;  oxyCODONE (OXY IR/ROXICODONE) 5 MG immediate release tablet, Take 5-10 mg by mouth every 4 (four) hours as needed. For pain, Disp: , Rfl: ;  polyethylene glycol (MIRALAX / GLYCOLAX) packet, Take 17 g by mouth daily., Disp: 14 each, Rfl:    potassium chloride SA (K-DUR,KLOR-CON) 20 MEQ tablet, Take 1 tablet (20 mEq total) by mouth 2 (two) times daily., Disp: 6 tablet, Rfl: 0;  promethazine (PHENERGAN) 12.5 MG tablet, Take 1 tablet (12.5 mg total) by mouth every 6 (six) hours as needed for nausea., Disp: 30 tablet, Rfl: 0;  senna (SENOKOT) 8.6 MG TABS, Take 1 tablet (8.6 mg total) by mouth at bedtime as needed., Disp: 120 each, Rfl: 0  Allergies: No Known Allergies  Past Medical History, Surgical history, Social history, and Family History were reviewed and updated.  Review of Systems: Constitutional:  Negative for fever, chills, night sweats, anorexia, weight loss, pain. Cardiovascular: no chest pain or dyspnea on exertion Respiratory: negative Neurological: negative Dermatological: negative ENT: negative Skin: Negative. Gastrointestinal: negative Genito-Urinary: negative Hematological and Lymphatic: negative Breast: negative Musculoskeletal: negative Remaining ROS negative. Physical Exam: Blood pressure 149/96, pulse 98, temperature 96.7 F (35.9 C), temperature source Oral, resp. rate 20, weight 226 lb 7 oz (102.711 kg). ECOG: 3 General appearance: alert Head: Normocephalic, without obvious abnormality, atraumatic Neck: no adenopathy, no carotid bruit, no JVD, supple, symmetrical, trachea midline and thyroid not enlarged, symmetric, no tenderness/mass/nodules Lymph nodes: Cervical, supraclavicular, and axillary nodes normal. Heart:regular rate and rhythm, S1, S2 normal, no murmur, click, rub or gallop Lung:chest clear, no wheezing, rales, normal symmetric air entry Abdomin: soft, non-tender, without masses or organomegaly EXT:no erythema, induration, or nodules   Lab Results: Lab Results  Component Value  Date   WBC 8.5 03/18/2012   HGB 8.7* 03/18/2012   HCT 27.4* 03/18/2012   MCV 85.1 03/18/2012   PLT 378 03/18/2012     Chemistry      Component Value Date/Time   NA 140 03/18/2012 1516   NA 137 03/01/2012 0834   K  3.7 03/18/2012 1516   K 3.2* 03/01/2012 0834   CL 106 03/18/2012 1516   CL 94* 03/01/2012 0834   CO2 22 03/18/2012 1516   CO2 25 03/01/2012 0834   BUN 12.0 03/18/2012 1516   BUN 11 03/01/2012 0834   CREATININE 0.7 03/18/2012 1516   CREATININE 0.65 03/01/2012 0834      Component Value Date/Time   CALCIUM 9.6 03/18/2012 1516   CALCIUM 8.8 03/01/2012 0834   ALKPHOS 349* 03/18/2012 1516   ALKPHOS 388* 03/01/2012 0834   AST 34 03/18/2012 1516   AST 48* 03/01/2012 0834   ALT 62* 03/18/2012 1516   ALT 111* 03/01/2012 0834   BILITOT 0.44 03/18/2012 1516   BILITOT 0.5 03/01/2012 0834       Impression and Plan: This is a 57 year old gentleman with the following issues:  1. Castration-resistant prostate cancer. He was diagnosed initially in January 2012, presented with a PSA close to 1400. He is S/P  bilateral orchiectomy and currently on casodex. His last PSA is 538 which is up from 450. Options of treatment discussed today. He has been no compliant with his medications in the past and it is unclear if he is taking casodex.  Zytiga would be a better option, but issue of compliance might hinder that.  I discussed chemotherapy as well. Unfortunately, he is a poor candidate at this time. He is willing to consider that.  I think he needs to complete radiation first and assess him after that to determine the best course of action.   2. Bone pain: he is getting XRT. I will consider Zometa or Xgeva if he is not getting that at [facility].   3. Pain: he is on Dilaudid with some relief now.    Stephen Hose, MD 1/9/20144:19 PM

## 2012-03-21 ENCOUNTER — Encounter: Payer: Self-pay | Admitting: Radiation Oncology

## 2012-03-21 ENCOUNTER — Ambulatory Visit
Admission: RE | Admit: 2012-03-21 | Discharge: 2012-03-21 | Disposition: A | Discharge status: Home / Self Care | Payer: Medicaid Other | Source: Ambulatory Visit | Attending: Radiation Oncology | Admitting: Radiation Oncology

## 2012-03-21 ENCOUNTER — Encounter: Payer: Self-pay | Admitting: Oncology

## 2012-03-21 VITALS — BP 137/86 | HR 99 | Temp 98.0°F | Resp 20 | Wt 227.8 lb

## 2012-03-21 DIAGNOSIS — C7951 Secondary malignant neoplasm of bone: Secondary | ICD-10-CM

## 2012-03-21 NOTE — Progress Notes (Signed)
   Department of Radiation Oncology  Phone:  [phone] Fax:        [phone]  Weekly Treatment Note    Name: [facility] Date: 03/21/2012 MRN: [id number] DOB: November 27, 1955   Current dose: 24 Gy  Current fraction: 8   MEDICATIONS: Current Outpatient Prescriptions  Medication Sig Dispense Refill  . cholecalciferol (VITAMIN D) 1000 UNITS tablet Take 1,000 Units by mouth daily.      Marland Kitchen docusate sodium (COLACE) 100 MG capsule Take 100 mg by mouth 2 (two) times daily.      . furosemide (LASIX) 40 MG tablet Take 1 tablet (40 mg total) by mouth 2 (two) times daily.  6 tablet  0  . HYDROmorphone (DILAUDID) 4 MG tablet Take 4 mg by mouth every 4 (four) hours as needed. Script refilled by Dr. Roselind Messier 03/13/2012 qty 30 no refills original script given by ED provider      . oxyCODONE (OXY IR/ROXICODONE) 5 MG immediate release tablet Take 5-10 mg by mouth every 4 (four) hours as needed. For pain      . polyethylene glycol (MIRALAX / GLYCOLAX) packet Take 17 g by mouth daily.  14 each    . potassium chloride SA (K-DUR,KLOR-CON) 20 MEQ tablet Take 1 tablet (20 mEq total) by mouth 2 (two) times daily.  6 tablet  0  . promethazine (PHENERGAN) 12.5 MG tablet Take 1 tablet (12.5 mg total) by mouth every 6 (six) hours as needed for nausea.  30 tablet  0  . senna (SENOKOT) 8.6 MG TABS Take 1 tablet (8.6 mg total) by mouth at bedtime as needed.  120 each  0     ALLERGIES: Review of patient's allergies indicates no known allergies.   LABORATORY DATA:  Lab Results  Component Value Date   WBC 8.5 03/18/2012   HGB 8.7* 03/18/2012   HCT 27.4* 03/18/2012   MCV 85.1 03/18/2012   PLT 378 03/18/2012   Lab Results  Component Value Date   NA 140 03/18/2012   K 3.7 03/18/2012   CL 106 03/18/2012   CO2 22 03/18/2012   Lab Results  Component Value Date   ALT 62* 03/18/2012   AST 34 03/18/2012   ALKPHOS 349* 03/18/2012   BILITOT 0.44 03/18/2012     NARRATIVE: [facility] was seen today for weekly treatment  management. The chart was checked and the patient's films were reviewed. The patient states that he is doing relatively well. He notes some shooting pain in the lower extremities with some occasional numbness.  PHYSICAL EXAMINATION: weight is 227 lb 12.8 oz (103.329 kg). His oral temperature is 98 F (36.7 C). His blood pressure is 137/86 and his pulse is 99. His respiration is 20.     ASSESSMENT: The patient is doing satisfactorily with treatment. No major difficulties with treatment. We will continue to follow his symptoms which will hopefully improve as he nears completion of his treatment.  PLAN: We will continue with the patient's radiation treatment as planned.

## 2012-03-21 NOTE — Progress Notes (Signed)
I called Medicaid-- dept of social services. Rep advised that patient did not have medicaid. I advised her of the card he bought in showing issue date 03/14/12. I advised Meredith.

## 2012-03-21 NOTE — Progress Notes (Signed)
  Radiation Oncology         (336) [phone] ________________________________  Name: [facility]  MRN: [id number]  Date: 03/11/2012  DOB: [birth year]  Simulation Verification Note  Status: outpatient  NARRATIVE: The patient was brought to the treatment unit and placed in the planned treatment position. The clinical setup was verified. Then port films were obtained and uploaded to the radiation oncology medical record software.  The treatment beams were carefully compared against the planned radiation fields. The position location and shape of the radiation fields was reviewed. They targeted volume of tissue appears to be appropriately covered by the radiation beams. Organs at risk appear to be excluded as planned.  Based on my personal review, I approved the simulation verification. The patient's treatment will proceed as planned.  ------------------------------------------------  [facility], M.D.

## 2012-03-21 NOTE — Progress Notes (Signed)
Patient here for weekly rad txs T11-L3 spine, alert, fatigued, has  Cane at side, , flat affect, alert,oriented, states pain in right leg numbness 2-3 weeks, last meal eaten yesterday Was a sausage dog with juice  And drinks water, no dinner no breakfast,states"nauseated all the time", not taking his compazine ," every time I triy to eat, I feel like throwing up"-only taking dilaudid, has ran out of his Oxy IR, not taking a lot of his medications, las dilaudid rx written 03/14/11 by Dr.Kinard 30, 1 q 4 h prn, patient states "i only have 2 left", need another refill,  Gave 1 month f/u appt card 10:56 AM

## 2012-03-24 ENCOUNTER — Ambulatory Visit
Admission: RE | Admit: 2012-03-24 | Discharge: 2012-03-24 | Disposition: A | Discharge status: Home / Self Care | Payer: Medicaid Other | Source: Ambulatory Visit | Attending: Radiation Oncology | Admitting: Radiation Oncology

## 2012-03-25 ENCOUNTER — Encounter: Payer: Self-pay | Admitting: Radiation Oncology

## 2012-03-25 ENCOUNTER — Ambulatory Visit
Admission: RE | Admit: 2012-03-25 | Discharge: 2012-03-25 | Disposition: A | Discharge status: Home / Self Care | Payer: Medicaid Other | Source: Ambulatory Visit | Attending: Radiation Oncology | Admitting: Radiation Oncology

## 2012-03-25 VITALS — BP 128/92 | HR 99 | Resp 16 | Wt 226.0 lb

## 2012-03-25 DIAGNOSIS — C61 Malignant neoplasm of prostate: Secondary | ICD-10-CM

## 2012-03-25 MED ORDER — HYDROMORPHONE HCL 4 MG PO TABS: 4.0000 mg | ORAL_TABLET | ORAL | Status: DC | PRN

## 2012-03-25 NOTE — Addendum Note (Signed)
Encounter addended by: Lurline Hare, MD on: 03/25/2012  4:35 PM<BR>     Documentation filed: Notes Section

## 2012-03-25 NOTE — Progress Notes (Signed)
Weekly Management Note Current Dose:  30 Gy  Projected Dose: 30 Gy   Narrative:  The patient presents for routine under treatment assessment.  CBCT/MVCT images/Port film x-rays were reviewed.  The chart was checked. Patietn has relief of pain with dilaudid (refilled by JK on 03/13/12) Taking every 8-10 hours. No constipation (last BM 2 days ago) and has stoll softeners at home. Needs refill on Casodex as well.   Physical Findings: Weight: 226 lb (102.513 kg). Unchanged. Slow, slurred speech.   Impression:  The patient tolerated treatment well.   Plan:  Refilled Dilaudid and encouraged him to start weaning as RT should be active in controlling pain. Pt will call Shadad for refill on casodex. F/u in 1 month. appt given.

## 2012-03-25 NOTE — Progress Notes (Signed)
Patient presents to the clinic today unaccompanied for PUT with Dr. Michell Heinrich following final treatment. Patient alert and oriented to person, place, and time. No distress noted. Slow steady gait noted. Patient tearful but, won't explain why. Patient reports bilateral rib and upper extremity pain 5 on a scale of 0-10. Patient reports nausea and vomiting yesterday but, denies any today. Patient reports that the pain in his right hip has resolved. Patient denies headache or dizziness. Patient reports occasional night sweats. Weight stable from last weeks PUT however patient weighed 254 November of 2013. Patient reports a decreased appetite. Patient reports having a bowel movement two days ago. Provided patient with one month follow up appointment. Patient requesting refill for Hydromorphone 4 mg every four hours and bicalutamide 50 mg by once per day. Reported all findings to Dr. Michell Heinrich.

## 2012-04-08 NOTE — Progress Notes (Signed)
  Radiation Oncology         [phone] ________________________________  Name: [facility] MRN: [id number]  Date: 03/25/2012  DOB: 09-04-1955  End of Treatment Note  Diagnosis:   Metastatic prostate cancer     Indication for treatment:  Palliative       Radiation treatment dates:   03/11/2012 through 03/25/2012  Site/dose:   The patient was treated to the lower T-spine through the mid-sacral region (T10 - S3) to a dose of 30 gray in 10 fractions at 3 gray per fraction. We treated the patient using a half-beam blocked 6 field technique to attempt to spare normal tissue as much as possible. This consisted of a 3-D conformal technique.  Narrative: The patient tolerated radiation treatment relatively well.   The patient did not exhibit any significant difficulties during his course of treatment with acute toxicity.  Plan: The patient has completed radiation treatment. The patient will return to radiation oncology clinic for routine followup in one month. I advised the patient to call or return sooner if they have any questions or concerns related to their recovery or treatment. ________________________________  Radene Gunning, M.D., Ph.D.

## 2012-04-22 ENCOUNTER — Other Ambulatory Visit (HOSPITAL_BASED_OUTPATIENT_CLINIC_OR_DEPARTMENT_OTHER): Payer: Self-pay

## 2012-04-22 ENCOUNTER — Encounter: Payer: Self-pay | Admitting: Radiation Oncology

## 2012-04-22 ENCOUNTER — Telehealth: Payer: Self-pay | Admitting: Oncology

## 2012-04-22 ENCOUNTER — Ambulatory Visit (HOSPITAL_BASED_OUTPATIENT_CLINIC_OR_DEPARTMENT_OTHER): Payer: Self-pay | Admitting: Oncology

## 2012-04-22 VITALS — BP 127/91 | HR 124 | Temp 97.0°F | Resp 20 | Ht 70.0 in | Wt 211.3 lb

## 2012-04-22 DIAGNOSIS — M949 Disorder of cartilage, unspecified: Secondary | ICD-10-CM

## 2012-04-22 DIAGNOSIS — D630 Anemia in neoplastic disease: Secondary | ICD-10-CM

## 2012-04-22 DIAGNOSIS — C7952 Secondary malignant neoplasm of bone marrow: Secondary | ICD-10-CM

## 2012-04-22 DIAGNOSIS — C61 Malignant neoplasm of prostate: Secondary | ICD-10-CM

## 2012-04-22 DIAGNOSIS — C7951 Secondary malignant neoplasm of bone: Secondary | ICD-10-CM

## 2012-04-22 LAB — CBC WITH DIFFERENTIAL/PLATELET
Basophils Absolute: 0.1 10*3/uL (ref 0.0–0.1)
Eosinophils Absolute: 0.1 10*3/uL (ref 0.0–0.5)
LYMPH%: 15.6 % (ref 14.0–49.0)
MCV: 79 fL — ABNORMAL LOW (ref 79.3–98.0)
MONO%: 9.6 % (ref 0.0–14.0)
NEUT#: 5.4 10*3/uL (ref 1.5–6.5)
Platelets: 271 10*3/uL (ref 140–400)
RBC: 2.22 10*6/uL — ABNORMAL LOW (ref 4.20–5.82)

## 2012-04-22 LAB — COMPREHENSIVE METABOLIC PANEL (CC13)
Alkaline Phosphatase: 468 U/L — ABNORMAL HIGH (ref 40–150)
BUN: 14.3 mg/dL (ref 7.0–26.0)
Glucose: 119 mg/dl — ABNORMAL HIGH (ref 70–99)
Sodium: 139 mEq/L (ref 136–145)
Total Bilirubin: 0.3 mg/dL (ref 0.20–1.20)

## 2012-04-22 LAB — PSA: PSA: 505.8 ng/mL — ABNORMAL HIGH (ref <=4.00)

## 2012-04-22 NOTE — Progress Notes (Signed)
Hematology and Oncology Follow Up Visit  [facility] [id number] Nov 06, 1955 57 y.o. 04/22/2012 4:45 PM   Principle Diagnosis: 57 year old with prostate cancer diagnosed in 03/2010. He presented with a PSA close to 1400 and back pain  And advanced disease.  Gleason score was 4 + 3 equals 7.   Prior Therapy: He underwent a transurethral resection of the prostate procedure and bilateral orchiectomy. His Gleason score was 4 + 3 equals 7. Most recently he is developing a rise in his PSA despite castrate level of testosterone.  Current therapy: He is on Casodex as combined androgen deprivation.   He Completed palliative radiation therapy for a total of 30 Gy on 03/25/2012.   Interim History:  [facility] presents today for a follow up visit. He is a nice man with the above diagnosis. He completed palliative radiation therapy to the spine with improvement in his overall pain. He is ambulate better and not using a walker on a wheelchair. He is reporting shortness of breath with activity, but still able to walk short distances. No chest pain or dizziness.  He is not really taking any medication and he report that he can not take any since he can not afford it. Despite doing poorly, he slightly better than last visit.   Medications: I have reviewed the patient's current medications. Current outpatient prescriptions:cholecalciferol (VITAMIN D) 1000 UNITS tablet, Take 1,000 Units by mouth daily., Disp: , Rfl: ;  docusate sodium (COLACE) 100 MG capsule, Take 100 mg by mouth 2 (two) times daily., Disp: , Rfl: ;  furosemide (LASIX) 40 MG tablet, Take 1 tablet (40 mg total) by mouth 2 (two) times daily., Disp: 6 tablet, Rfl: 0 HYDROmorphone (DILAUDID) 4 MG tablet, Take 1 tablet (4 mg total) by mouth every 4 (four) hours as needed. Script refilled by Dr. Roselind Messier 03/13/2012 qty 30 no refills original script given by ED provider, Disp: 30 tablet, Rfl: 0;  oxyCODONE (OXY IR/ROXICODONE) 5 MG immediate release tablet, Take  5-10 mg by mouth every 4 (four) hours as needed. For pain, Disp: , Rfl:  polyethylene glycol (MIRALAX / GLYCOLAX) packet, Take 17 g by mouth daily., Disp: 14 each, Rfl: ;  potassium chloride SA (K-DUR,KLOR-CON) 20 MEQ tablet, Take 1 tablet (20 mEq total) by mouth 2 (two) times daily., Disp: 6 tablet, Rfl: 0;  promethazine (PHENERGAN) 12.5 MG tablet, Take 1 tablet (12.5 mg total) by mouth every 6 (six) hours as needed for nausea., Disp: 30 tablet, Rfl: 0 senna (SENOKOT) 8.6 MG TABS, Take 1 tablet (8.6 mg total) by mouth at bedtime as needed., Disp: 120 each, Rfl: 0  Allergies: No Known Allergies  Past Medical History, Surgical history, Social history, and Family History were reviewed and updated.  Review of Systems: Constitutional:  Negative for fever, chills, night sweats, anorexia, weight loss, pain. Cardiovascular: no chest pain or dyspnea on exertion Respiratory: negative Neurological: negative Dermatological: negative ENT: negative Skin: Negative. Gastrointestinal: negative Genito-Urinary: negative Hematological and Lymphatic: negative Breast: negative Musculoskeletal: negative Remaining ROS negative. Physical Exam: Blood pressure 127/91, pulse 124, temperature 97 F (36.1 C), temperature source Oral, resp. rate 20, height 5\' 10"  (1.778 m), weight 211 lb 4.8 oz (95.845 kg). ECOG: 3 General appearance: alert Head: Normocephalic, without obvious abnormality, atraumatic Neck: no adenopathy, no carotid bruit, no JVD, supple, symmetrical, trachea midline and thyroid not enlarged, symmetric, no tenderness/mass/nodules Lymph nodes: Cervical, supraclavicular, and axillary nodes normal. Heart:regular rate and rhythm, S1, S2 normal, no murmur, click, rub or gallop Lung:chest  clear, no wheezing, rales, normal symmetric air entry Abdomin: soft, non-tender, without masses or organomegaly EXT:no erythema, induration, or nodules   Lab Results: Lab Results  Component Value Date   WBC 7.4  04/22/2012   HGB 5.5* 04/22/2012   HCT 17.6* 04/22/2012   MCV 79.0* 04/22/2012   PLT 271 04/22/2012     Chemistry      Component Value Date/Time   NA 139 04/22/2012 1533   NA 137 03/01/2012 0834   K 4.0 04/22/2012 1533   K 3.2* 03/01/2012 0834   CL 103 04/22/2012 1533   CL 94* 03/01/2012 0834   CO2 22 04/22/2012 1533   CO2 25 03/01/2012 0834   BUN 14.3 04/22/2012 1533   BUN 11 03/01/2012 0834   CREATININE 0.8 04/22/2012 1533   CREATININE 0.65 03/01/2012 0834      Component Value Date/Time   CALCIUM 8.7 04/22/2012 1533   CALCIUM 8.8 03/01/2012 0834   ALKPHOS 468* 04/22/2012 1533   ALKPHOS 388* 03/01/2012 0834   AST 25 04/22/2012 1533   AST 48* 03/01/2012 0834   ALT 56* 04/22/2012 1533   ALT 111* 03/01/2012 0834   BILITOT 0.30 04/22/2012 1533   BILITOT 0.5 03/01/2012 0834       Impression and Plan: This is a 57 year old gentleman with the following issues:  1. Castration-resistant prostate cancer. He was diagnosed initially in January 2012, presented with a PSA close to 1400. He is S/P  bilateral orchiectomy and currently on casodex which he is not taking. His last PSA is 538 which is up from 450.  Options of treatment discussed today. He has been non compliant with his medications in the past.  [name] would be a better option, but issue of compliance and coverage might come in the way. I discussed chemotherapy as well. Unfortunately, he is a poor candidate at this time. He is willing to consider it   I will have our social workers meet with him in his next trip to the cancer center to discuss his options, for now we will continue supportive care.   2. Bone pain: Improved with XRT. We will hold off on Xgeva for now.   3. 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 I offered him transfusion and he is OK with that. I will schedule that in the near future. I gave him instruction to go to the ED if he develops chest pain, increase shortness of breath or dizziness.    [facility],  MD 2/11/20144:45 PM

## 2012-04-23 ENCOUNTER — Ambulatory Visit
Admission: RE | Admit: 2012-04-23 | Discharge: 2012-04-23 | Disposition: A | Discharge status: Home / Self Care | Payer: Self-pay | Source: Ambulatory Visit | Attending: Radiation Oncology | Admitting: Radiation Oncology

## 2012-04-23 VITALS — BP 142/89 | HR 57 | Temp 97.6°F | Resp 28 | Wt 212.7 lb

## 2012-04-23 DIAGNOSIS — C7951 Secondary malignant neoplasm of bone: Secondary | ICD-10-CM

## 2012-04-23 DIAGNOSIS — C7952 Secondary malignant neoplasm of bone marrow: Secondary | ICD-10-CM

## 2012-04-23 NOTE — Progress Notes (Signed)
Patient here for routine follow up.Generalized back pain, ankles and lower extremities.Shortness of breath related to low hgb.For blood transfusion on Friday, 04/25/12.Not taking any medications as has no further coverage until June 2014.States he does have a card through [facility] but doesnot like to take medications.

## 2012-04-24 ENCOUNTER — Ambulatory Visit: Payer: Self-pay | Admitting: Radiation Oncology

## 2012-04-24 NOTE — Progress Notes (Signed)
  Radiation Oncology         [phone] ________________________________  Name: Stephen Stokes MRN: [id number]  Date: 04/23/2012  DOB: 1955/11/06  Follow-Up Visit Note  CC: Burtis Junes, MD  Benjiman Core, MD  Diagnosis:   Metastatic prostate cancer  Interval Since Last Radiation:  One month   Narrative:  The patient returns today for routine follow-up.  The patient finished his palliative course of radiotherapy on 03/25/2012. He received radiotherapy to the lower thoracic spine through the S3 level approximately. The patient indicates that his pain is improved. He relates no difficulties in terms of acute toxicity, no nausea and no diarrhea. The patient is currently considering chemotherapy and will further discuss this at his next medical oncology appointment.                              ALLERGIES:  has No Known Allergies.  Meds: Current Outpatient Prescriptions  Medication Sig Dispense Refill  . cholecalciferol (VITAMIN D) 1000 UNITS tablet Take 1,000 Units by mouth daily.      Marland Kitchen docusate sodium (COLACE) 100 MG capsule Take 100 mg by mouth 2 (two) times daily.      . furosemide (LASIX) 40 MG tablet Take 1 tablet (40 mg total) by mouth 2 (two) times daily.  6 tablet  0  . HYDROmorphone (DILAUDID) 4 MG tablet Take 1 tablet (4 mg total) by mouth every 4 (four) hours as needed. Script refilled by Dr. Roselind Messier 03/13/2012 qty 30 no refills original script given by ED provider  30 tablet  0  . oxyCODONE (OXY IR/ROXICODONE) 5 MG immediate release tablet Take 5-10 mg by mouth every 4 (four) hours as needed. For pain      . polyethylene glycol (MIRALAX / GLYCOLAX) packet Take 17 g by mouth daily.  14 each    . potassium chloride SA (K-DUR,KLOR-CON) 20 MEQ tablet Take 1 tablet (20 mEq total) by mouth 2 (two) times daily.  6 tablet  0  . promethazine (PHENERGAN) 12.5 MG tablet Take 1 tablet (12.5 mg total) by mouth every 6 (six) hours as needed for nausea.  30 tablet  0  . senna (SENOKOT)  8.6 MG TABS Take 1 tablet (8.6 mg total) by mouth at bedtime as needed.  120 each  0   No current facility-administered medications for this encounter.    Physical Findings: The patient is in no acute distress. Patient is alert and oriented.  weight is 212 lb 11.2 oz (96.48 kg). His temperature is 97.6 F (36.4 C). His blood pressure is 142/89 and his pulse is 57. His respiration is 28 and oxygen saturation is 100%. .     Lab Findings: Lab Results  Component Value Date   WBC 7.4 04/22/2012   HGB 5.5* 04/22/2012   HCT 17.6* 04/22/2012   MCV 79.0* 04/22/2012   PLT 271 04/22/2012     Radiographic Findings: No results found.  Impression:    Patient had a good palliative effect with his course of radiotherapy. He did well with his treatment overall.  Plan:  He has ongoing followup with medical oncology with regards to potential additional systemic treatment. He will followup in our clinic on a when necessary basis.   Radene Gunning, M.D., Ph.D.

## 2012-04-25 ENCOUNTER — Telehealth: Payer: Self-pay | Admitting: Oncology

## 2012-04-25 ENCOUNTER — Other Ambulatory Visit: Payer: Self-pay

## 2012-04-25 NOTE — Telephone Encounter (Signed)
Pt called today and r/s 2/14 lb/blood to 2/18. Message to desk nurse.

## 2012-04-26 ENCOUNTER — Other Ambulatory Visit: Payer: Self-pay

## 2012-04-26 ENCOUNTER — Emergency Department (HOSPITAL_COMMUNITY): Payer: Self-pay

## 2012-04-26 ENCOUNTER — Inpatient Hospital Stay (HOSPITAL_COMMUNITY)
Admission: EM | Admit: 2012-04-26 | Discharge: 2012-04-28 | DRG: 812 | Disposition: A | Discharge status: Home / Self Care | Payer: Self-pay | Attending: Internal Medicine | Admitting: Internal Medicine

## 2012-04-26 ENCOUNTER — Encounter (HOSPITAL_COMMUNITY): Payer: Self-pay | Admitting: Emergency Medicine

## 2012-04-26 DIAGNOSIS — R0602 Shortness of breath: Secondary | ICD-10-CM

## 2012-04-26 DIAGNOSIS — E785 Hyperlipidemia, unspecified: Secondary | ICD-10-CM | POA: Diagnosis present

## 2012-04-26 DIAGNOSIS — I4729 Other ventricular tachycardia: Secondary | ICD-10-CM | POA: Diagnosis present

## 2012-04-26 DIAGNOSIS — I472 Ventricular tachycardia, unspecified: Secondary | ICD-10-CM | POA: Diagnosis present

## 2012-04-26 DIAGNOSIS — K5730 Diverticulosis of large intestine without perforation or abscess without bleeding: Secondary | ICD-10-CM | POA: Diagnosis present

## 2012-04-26 DIAGNOSIS — R079 Chest pain, unspecified: Secondary | ICD-10-CM

## 2012-04-26 DIAGNOSIS — Z79899 Other long term (current) drug therapy: Secondary | ICD-10-CM

## 2012-04-26 DIAGNOSIS — Y92009 Unspecified place in unspecified non-institutional (private) residence as the place of occurrence of the external cause: Secondary | ICD-10-CM

## 2012-04-26 DIAGNOSIS — F17200 Nicotine dependence, unspecified, uncomplicated: Secondary | ICD-10-CM | POA: Diagnosis present

## 2012-04-26 DIAGNOSIS — R748 Abnormal levels of other serum enzymes: Secondary | ICD-10-CM

## 2012-04-26 DIAGNOSIS — T451X5A Adverse effect of antineoplastic and immunosuppressive drugs, initial encounter: Secondary | ICD-10-CM | POA: Diagnosis present

## 2012-04-26 DIAGNOSIS — C61 Malignant neoplasm of prostate: Secondary | ICD-10-CM

## 2012-04-26 DIAGNOSIS — F411 Generalized anxiety disorder: Secondary | ICD-10-CM | POA: Diagnosis present

## 2012-04-26 DIAGNOSIS — D6481 Anemia due to antineoplastic chemotherapy: Principal | ICD-10-CM | POA: Diagnosis present

## 2012-04-26 DIAGNOSIS — K5790 Diverticulosis of intestine, part unspecified, without perforation or abscess without bleeding: Secondary | ICD-10-CM

## 2012-04-26 DIAGNOSIS — R0789 Other chest pain: Secondary | ICD-10-CM

## 2012-04-26 DIAGNOSIS — Z923 Personal history of irradiation: Secondary | ICD-10-CM

## 2012-04-26 DIAGNOSIS — I10 Essential (primary) hypertension: Secondary | ICD-10-CM

## 2012-04-26 DIAGNOSIS — Z9079 Acquired absence of other genital organ(s): Secondary | ICD-10-CM

## 2012-04-26 DIAGNOSIS — Z9089 Acquired absence of other organs: Secondary | ICD-10-CM

## 2012-04-26 DIAGNOSIS — D649 Anemia, unspecified: Secondary | ICD-10-CM

## 2012-04-26 DIAGNOSIS — C7951 Secondary malignant neoplasm of bone: Secondary | ICD-10-CM | POA: Diagnosis present

## 2012-04-26 DIAGNOSIS — N1330 Unspecified hydronephrosis: Secondary | ICD-10-CM

## 2012-04-26 LAB — CBC WITH DIFFERENTIAL/PLATELET
Eosinophils Relative: 1 % (ref 0–5)
HCT: 18.2 % — ABNORMAL LOW (ref 39.0–52.0)
Lymphs Abs: 1.3 10*3/uL (ref 0.7–4.0)
MCH: 24.3 pg — ABNORMAL LOW (ref 26.0–34.0)
MCV: 82 fL (ref 78.0–100.0)
Monocytes Absolute: 0.7 10*3/uL (ref 0.1–1.0)
Monocytes Relative: 7 % (ref 3–12)
Neutro Abs: 7.4 10*3/uL (ref 1.7–7.7)
Platelets: 252 10*3/uL (ref 150–400)
RBC: 2.22 MIL/uL — ABNORMAL LOW (ref 4.22–5.81)
WBC: 9.5 10*3/uL (ref 4.0–10.5)

## 2012-04-26 LAB — PREPARE RBC (CROSSMATCH)

## 2012-04-26 LAB — COMPREHENSIVE METABOLIC PANEL
ALT: 21 U/L (ref 0–53)
Alkaline Phosphatase: 354 U/L — ABNORMAL HIGH (ref 39–117)
BUN: 10 mg/dL (ref 6–23)
CO2: 20 mEq/L (ref 19–32)
Chloride: 98 mEq/L (ref 96–112)
GFR calc Af Amer: >90 mL/min (ref >90)
GFR calc non Af Amer: >90 mL/min (ref >90)
Glucose, Bld: 105 mg/dL — ABNORMAL HIGH (ref 70–99)
Potassium: 3.5 mEq/L (ref 3.5–5.1)
Sodium: 135 mEq/L (ref 135–145)
Total Bilirubin: 0.3 mg/dL (ref 0.3–1.2)

## 2012-04-26 MED ORDER — HYDRALAZINE HCL 10 MG PO TABS
10.0000 mg | ORAL_TABLET | Freq: Three times a day (TID) | ORAL | Status: DC
  Administered 2012-04-26 – 2012-04-28 (×6): 10 mg via ORAL
  Filled 2012-04-26 (×9): qty 1

## 2012-04-26 MED ORDER — ISOSORBIDE MONONITRATE 15 MG HALF TABLET
15.0000 mg | ORAL_TABLET | Freq: Every day | ORAL | Status: DC
  Administered 2012-04-26 – 2012-04-28 (×3): 15 mg via ORAL
  Filled 2012-04-26 (×3): qty 1

## 2012-04-26 MED ORDER — ALBUTEROL SULFATE (5 MG/ML) 0.5% IN NEBU: 2.5000 mg | INHALATION_SOLUTION | RESPIRATORY_TRACT | Status: DC | PRN

## 2012-04-26 MED ORDER — ACETAMINOPHEN 500 MG PO TABS
1000.0000 mg | ORAL_TABLET | Freq: Once | ORAL | Status: AC
  Administered 2012-04-26: 1000 mg via ORAL
  Filled 2012-04-26: qty 2

## 2012-04-26 MED ORDER — LORAZEPAM 2 MG/ML IJ SOLN
1.0000 mg | Freq: Once | INTRAMUSCULAR | Status: AC
  Administered 2012-04-26: 1 mg via INTRAVENOUS
  Filled 2012-04-26: qty 1

## 2012-04-26 NOTE — H&P (Addendum)
[facility] History and Physical  Stephen Stokes RUE:[id number] DOB: February 01, 1956 DOA: 04/26/2012  Referring physician: ER physician PCP: Burtis Junes, MD   Chief Complaint: chest pain and shortness of breath  HPI:  57 year old male with past medical history of prostate cancer with osseous metastases (under Dr. Alver Fisher care) status post radiation therapy completed 03/25/2012, on chemotherapy with Casodex, HTN, dyslipidemia who presented with worsening shortness of breath and associated mid sternal chest pain for past few days prior to this admission. Pain is more pronounced with exertion but he noticed it even at rest. Pain is 6-7/10 in intensity, non radiating. Pain was somewhat relieved with nitroglycerin given in ED. No associated diaphoresis, no nausea or vomiting. No complaints of fever or chills or cough. No abdominal pain. No reports of blood in stool or urine. No loss of consciousness. In ED, patient was found to have hemoglobin of 5.4 (compared to 04/22/2012 value of 5.5). EKG showed normal sinus rhythm and patient was hemodynamically stable with BP 136/64. CXR did not reveal acute cardiopulmonary process.  Assessment and Plan:  Principal Problem:   Midsternal chest pain and shortness of breath  Likely related to symptomatic anemia related to chemotherapy  Cardiac enzymes x 1 set negative. EKG 12 lead with normal sinus rhythm.  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 Negative for diaphoresis.  HENT: Negative for hearing loss, ear pain, nosebleeds, congestion, sore throat, neck pain, tinnitus and ear discharge.   Eyes: Negative for blurred vision, double vision, photophobia, pain, discharge and redness.  Respiratory: Negative for cough, hemoptysis, sputum production, positive for shortness of breath, no wheezing and stridor.   Cardiovascular: per HPI.  Gastrointestinal: Negative for nausea, vomiting and abdominal pain. Negative for heartburn, constipation, blood in stool and melena.  Genitourinary: Negative for dysuria, urgency, frequency, hematuria and flank pain.  Musculoskeletal: Negative for myalgias, back pain, joint pain and falls.  Skin: Negative for itching and rash.  Neurological: Negative for dizziness and weakness. Negative for tingling, tremors, sensory change, speech change, focal weakness, loss of consciousness and headaches.  Endo/Heme/Allergies: Negative for environmental allergies and polydipsia. Does not bruise/bleed easily.  Psychiatric/Behavioral: Negative for suicidal ideas. The patient is not nervous/anxious.      Past Medical History  Diagnosis Date  . Hypertension   . Diverticulosis     Noted on CT abdomen (06/2010)  . Hyperlipidemia   . Elevated PSA 02/13/12    450.60  . Bone cancer     diffuse T-L spine mets  . 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 Patsi Sears (06/2010) // CT abdomen and pelvis  (06/2010) revealed signficant retroperitoneal and pelvic adenopathy // Bone scan (06/2010) - showed diffuse osseous abnormality  . Hot flashes   . Anxiety  mild new dx  . Radiation 03/11/12-03/25/12    Palliative 30 gray to T10-S3 in 10  fractions   Past Surgical History  Procedure Laterality Date  . Orchiectomy  06/2010    bilateral  . Tonsillectomy    . Transurethral resection of prostate  06/2010    gleason 4+3=7   Social History:  reports that he has been smoking Cigarettes.  He has a 19.5 pack-year smoking history. He has never used smokeless tobacco. He reports that  drinks alcohol. He reports that he does not use illicit drugs.  No Known Allergies  Family History: htn in mother  Prior to Admission medications   Medication Sig Start Date End Date Taking? Authorizing Provider  acetaminophen (TYLENOL) 500 MG tablet Take 500 mg by mouth every 6 (six) hours as needed for pain.   Yes Historical Provider, MD  HYDROmorphone (DILAUDID) 4 MG tablet Take 1 tablet (4 mg total) by mouth every 4 (four) hours as needed. Script refilled by Dr. Roselind Messier 03/13/2012 qty 30 no refills original script given by ED provider 03/25/12  Yes Lurline Hare, MD   Physical Exam: Filed Vitals:   04/26/12 2203 04/26/12 2206 04/26/12 2215 04/26/12 2223  BP: 139/86 139/86  144/101  Pulse: 86     Temp: 98.2 F (36.8 C) 98.2 F (36.8 C) 98 F (36.7 C) 98 F (36.7 C)  TempSrc: Oral     Resp: 25     SpO2: 100%       Physical Exam  Constitutional: Appears well-developed and well-nourished. No distress.  HENT: Normocephalic. External right and left ear normal. Oropharynx is clear and moist.  Eyes: Conjunctivae and EOM are normal. PERRLA, no scleral icterus.  Neck: Normal ROM. Neck supple. No JVD. No tracheal deviation. No thyromegaly.  CVS: RRR, S1/S2 +, no murmurs, no gallops, no carotid bruit.  Pulmonary: Effort and breath sounds normal, no stridor, rhonchi, wheezes, rales.  Abdominal: Soft. BS +,  no distension, tenderness, rebound or guarding.  Musculoskeletal: Normal range of motion. No edema and no tenderness.  Lymphadenopathy: No lymphadenopathy noted, cervical, inguinal. Neuro: Alert. Normal reflexes, muscle tone coordination. No  cranial nerve deficit. Skin: Skin is warm and dry. No rash noted. Not diaphoretic. No erythema. No pallor.  Psychiatric: Normal mood and affect. Behavior, judgment, thought content normal.   Labs on Admission:  Basic Metabolic Panel:  Recent Labs Lab 04/22/12 1533 04/26/12 2018  NA 139 135  K 4.0 3.5  CL 103 98  CO2 22 20  GLUCOSE 119* 105*  BUN 14.3 10  CREATININE 0.8 0.66  CALCIUM 8.7 7.9*   Liver Function Tests:  Recent Labs Lab 04/22/12 1533 04/26/12 2018  AST 25 12  ALT 56* 21  ALKPHOS 468* 354*  BILITOT 0.30 0.3  PROT 8.3 7.6  ALBUMIN 3.0* 3.2*   No results found for this basename: LIPASE, AMYLASE,  in the last 168 hours No results found for this basename: AMMONIA,  in the last 168 hours CBC:  Recent Labs Lab 04/22/12 1533 04/26/12 2018  WBC 7.4 9.5  NEUTROABS 5.4 7.4  HGB 5.5* 5.4*  HCT 17.6* 18.2*  MCV 79.0* 82.0  PLT 271 252   Cardiac Enzymes:  Recent Labs Lab 04/26/12 2018  TROPONINI <0.30   BNP: No components found with this basename: POCBNP,  CBG: No results found for this basename: GLUCAP,  in the last 168 hours  Radiological Exams on Admission: Dg Chest Portable 1 View 04/26/2012  * IMPRESSION:  1.  Low  lung volumes. 2.  Cardiac enlargement without heart failure.   Original Report Authenticated By: Signa Kell, M.D.     EKG: Normal sinus rhythm, no ST/T wave changes  Time spent: 75 minutes

## 2012-04-26 NOTE — ED Notes (Signed)
ZOX:[id number]<AV> Expected date:04/26/12<BR> Expected time: 6:33 PM<BR> Means of arrival:Ambulance<BR> Comments:<BR> Shob

## 2012-04-26 NOTE — ED Provider Notes (Signed)
History     CSN: [id number]  Arrival date & time 04/26/12  4098   First MD Initiated Contact with Patient 04/26/12 1848      Chief Complaint  Patient presents with  . Shortness of Breath    (Consider location/radiation/quality/duration/timing/severity/associated sxs/prior treatment) HPI.... patient has known metastatic prostate cancer. He has recently become anemic and was scheduled for transfusion later this week.  Tonight he developed chest pain with dyspnea. EMS was notified. Sublingual nitroglycerin given which helped some.  Severity is moderate. No radiation of pain.  Past Medical History  Diagnosis Date  . Hypertension   . Diverticulosis     Noted on CT abdomen (06/2010)  . Hyperlipidemia   . Elevated PSA 02/13/12    450.60  . Bone cancer     diffuse T-L spine mets  . 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 Patsi Sears (06/2010) // CT abdomen and pelvis  (06/2010) revealed signficant retroperitoneal and pelvic adenopathy // Bone scan (06/2010) - showed diffuse osseous abnormality  . Hot flashes   . Anxiety     mild new dx  . Radiation 03/11/12-03/25/12    Palliative 30 gray to T10-S3 in 10 fractions    Past Surgical History  Procedure Laterality Date  . Orchiectomy  06/2010    bilateral  . Tonsillectomy    . Transurethral resection of prostate  06/2010    gleason 4+3=7    No family history on file.  History  Substance Use Topics  . Smoking status: Current Every Day Smoker -- 0.50 packs/day for 39 years    Types: Cigarettes  . Smokeless tobacco: Never Used     Comment: hx of 1 PPD  . Alcohol Use: Yes     Comment: occasional last 2 weeks ago, gin      Review of Systems  All other systems reviewed and are negative.    Allergies  Review of patient's allergies indicates no known allergies.  Home Medications   Current Outpatient Rx  Name  Route  Sig   Dispense  Refill  . acetaminophen (TYLENOL) 500 MG tablet   Oral   Take 500 mg by mouth every 6 (six) hours as needed for pain.         Marland Kitchen HYDROmorphone (DILAUDID) 4 MG tablet   Oral   Take 1 tablet (4 mg total) by mouth every 4 (four) hours as needed. Script refilled by Dr. Roselind Messier 03/13/2012 qty 30 no refills original script given by ED provider   30 tablet   0     BP 144/101  Pulse 86  Temp(Src) 98 F (36.7 C) (Oral)  Resp 25  SpO2 100%  Physical Exam  Nursing note and vitals reviewed. Constitutional: He is oriented to person, place, and time. He appears well-developed and well-nourished.  HENT:  Head: Normocephalic and atraumatic.  Eyes: Conjunctivae and EOM are normal. Pupils are equal, round, and reactive to light.  Neck: Normal range of motion. Neck supple.  Cardiovascular: Normal rate, regular rhythm and normal heart sounds.   Pulmonary/Chest: Effort normal and breath sounds normal.  Abdominal: Soft. Bowel sounds are normal.  Musculoskeletal: Normal range of motion.  Neurological: He is alert and oriented to person, place, and time.  Skin: Skin is warm and dry.  Psychiatric: He has a normal mood and affect.    ED Course  Procedures (including critical care time)  Labs  Reviewed  CBC WITH DIFFERENTIAL - Abnormal; Notable for the following:    RBC 2.22 (*)    Hemoglobin 5.4 (*)    HCT 18.2 (*)    MCH 24.3 (*)    MCHC 29.7 (*)    RDW 22.5 (*)    Neutrophils Relative 78 (*)    All other components within normal limits  COMPREHENSIVE METABOLIC PANEL - Abnormal; Notable for the following:    Glucose, Bld 105 (*)    Calcium 7.9 (*)    Albumin 3.2 (*)    Alkaline Phosphatase 354 (*)    All other components within normal limits  TROPONIN I  PATHOLOGIST SMEAR REVIEW  TYPE AND SCREEN  PREPARE RBC (CROSSMATCH)   Dg Chest Portable 1 View  04/26/2012  *RADIOLOGY REPORT*  Clinical Data: Short of breath and chest pain  PORTABLE CHEST - 1 VIEW  Comparison: 03/01/2012   Findings: The lung volumes are decreased.  The heart size is mildly enlarged.  No effusions or interstitial edema.  There is no airspace consolidation identified. The visualized osseous structures appear intact.  IMPRESSION:  1.  Low lung volumes. 2.  Cardiac enlargement without heart failure.   Original Report Authenticated By: Signa Kell, M.D.      1. Anemia   2. Chest pain   3. Prostate cancer metastatic to multiple sites       MDM  Hemoglobin is 5.4     will transfuse 2 units of packed cells.   Admit to general medicine        Donnetta Hutching, MD 04/26/12 2243

## 2012-04-26 NOTE — ED Notes (Signed)
Per EMS, short of breath while eating a pineapple-increased anxiety r/t upcoming blood transfusion on Tuesday-history of one cancer-324 mg of asa-1 SL nitro-chest pressure went from 6-4,BP improved-ST with PVC's/PAC's-lungs clear

## 2012-04-26 NOTE — ED Notes (Signed)
EMS, has not taken meds months-lost his Medicaid

## 2012-04-27 ENCOUNTER — Encounter (HOSPITAL_COMMUNITY): Payer: Self-pay | Admitting: *Deleted

## 2012-04-27 DIAGNOSIS — K5730 Diverticulosis of large intestine without perforation or abscess without bleeding: Secondary | ICD-10-CM

## 2012-04-27 DIAGNOSIS — R748 Abnormal levels of other serum enzymes: Secondary | ICD-10-CM

## 2012-04-27 LAB — CBC WITH DIFFERENTIAL/PLATELET
Eosinophils Absolute: 0.1 10*3/uL (ref 0.0–0.7)
Eosinophils Relative: 1 % (ref 0–5)
Lymphs Abs: 1.5 10*3/uL (ref 0.7–4.0)
MCH: 24.8 pg — ABNORMAL LOW (ref 26.0–34.0)
MCHC: 30.2 g/dL (ref 30.0–36.0)
MCV: 82.2 fL (ref 78.0–100.0)
Monocytes Absolute: 0.6 10*3/uL (ref 0.1–1.0)
Neutrophils Relative %: 74 % (ref 43–77)
Platelets: 201 10*3/uL (ref 150–400)
RBC: 2.7 MIL/uL — ABNORMAL LOW (ref 4.22–5.81)
RDW: 19.4 % — ABNORMAL HIGH (ref 11.5–15.5)

## 2012-04-27 LAB — COMPREHENSIVE METABOLIC PANEL
ALT: 19 U/L (ref 0–53)
Alkaline Phosphatase: 349 U/L — ABNORMAL HIGH (ref 39–117)
BUN: 9 mg/dL (ref 6–23)
CO2: 22 mEq/L (ref 19–32)
Chloride: 98 mEq/L (ref 96–112)
GFR calc Af Amer: >90 mL/min (ref >90)
GFR calc non Af Amer: >90 mL/min (ref >90)
Glucose, Bld: 131 mg/dL — ABNORMAL HIGH (ref 70–99)
Potassium: 3.4 mEq/L — ABNORMAL LOW (ref 3.5–5.1)
Sodium: 133 mEq/L — ABNORMAL LOW (ref 135–145)
Total Bilirubin: 0.3 mg/dL (ref 0.3–1.2)
Total Protein: 7.5 g/dL (ref 6.0–8.3)

## 2012-04-27 LAB — MAGNESIUM: Magnesium: 2 mg/dL (ref 1.5–2.5)

## 2012-04-27 LAB — PROTIME-INR: Prothrombin Time: 14 seconds (ref 11.6–15.2)

## 2012-04-27 LAB — GLUCOSE, CAPILLARY: Glucose-Capillary: 111 mg/dL — ABNORMAL HIGH (ref 70–99)

## 2012-04-27 MED ORDER — ONDANSETRON HCL 4 MG/2ML IJ SOLN: 4.0000 mg | Freq: Four times a day (QID) | INTRAMUSCULAR | Status: DC | PRN

## 2012-04-27 MED ORDER — ACETAMINOPHEN 500 MG PO TABS: 500.0000 mg | ORAL_TABLET | Freq: Four times a day (QID) | ORAL | Status: DC | PRN

## 2012-04-27 MED ORDER — SODIUM CHLORIDE 0.9 % IJ SOLN
3.0000 mL | Freq: Two times a day (BID) | INTRAMUSCULAR | Status: DC
  Administered 2012-04-27 – 2012-04-28 (×4): 3 mL via INTRAVENOUS

## 2012-04-27 MED ORDER — HYDROMORPHONE HCL 4 MG PO TABS: 4.0000 mg | ORAL_TABLET | ORAL | Status: DC | PRN

## 2012-04-27 MED ORDER — ONDANSETRON HCL 4 MG PO TABS: 4.0000 mg | ORAL_TABLET | Freq: Four times a day (QID) | ORAL | Status: DC | PRN

## 2012-04-27 MED ORDER — SODIUM CHLORIDE 0.9 % IV SOLN
INTRAVENOUS | Status: DC
  Administered 2012-04-27: 07:00:00 via INTRAVENOUS

## 2012-04-27 NOTE — Progress Notes (Signed)
UR completed 

## 2012-04-27 NOTE — Discharge Summary (Addendum)
Physician Discharge Summary  Stephen Stokes ZOX:[id number] DOB: 28-Jul-1955 DOA: 04/26/2012  PCP: Burtis Junes, MD  Admit date: 04/26/2012 Discharge date: 04/28/2012  Recommendations for Outpatient Follow-up:  1. Follow up with primary care doctor within 1-2 weeks of discharge to check repeat CBC, weight, blood pressure, and electrolytes.    Discharge Diagnoses:  Principal Problem:   Chest pain, midsternal Active Problems:   S/P TURP (status post transurethral resection of prostate)   H/O bilateral orchiectomies   Diverticulosis   Prostate cancer metastatic to multiple sites   HTN (hypertension)   Shortness of breath   Elevated alkaline phosphatase level   Discharge Condition: stable, improved  Diet recommendation: healthy heart, low sodium  Wt Readings from Last 3 Encounters:  04/28/12 94.8 kg (208 lb 15.9 oz)  04/23/12 96.48 kg (212 lb 11.2 oz)  04/22/12 95.845 kg (211 lb 4.8 oz)    History of present illness:   57 year old male with past medical history of prostate cancer with osseous metastases (under Dr. Alver Fisher care) status post radiation therapy completed 03/25/2012, on chemotherapy with Casodex, HTN, dyslipidemia who presented with worsening shortness of breath and associated mid sternal chest pain for past few days prior to this admission. Pain is more pronounced with exertion but he noticed it even at rest. Pain is 6-7/10 in intensity, non radiating. Pain was somewhat relieved with nitroglycerin given in ED. No associated diaphoresis, no nausea or vomiting. No complaints of fever or chills or cough. No abdominal pain. No reports of blood in stool or urine. No loss of consciousness.  In ED, patient was found to have hemoglobin of 5.4 (compared to 04/22/2012 value of 5.5). EKG showed normal sinus rhythm and patient was hemodynamically stable with BP 136/64. CXR did not reveal acute cardiopulmonary process.   Hospital Course:   Midsternal chest pain and shortness of  breath.  Pain was likely related to symptomatic anemia secondary to chemotherapy and resolved with blood transfusion.  Telemetry demonstrates occasional tachycardia and 3-4 beats of nonsustained VT.  Cardiac enzymes x 1 set negative. EKG 12 lead with normal sinus rhythm.  Blood transfusion started in ED, 2 units PRBC, however, his hemoglobin only increased to 6.7mg /dl. He was given an additional 2 units of PRBC and his follow up hemoglobin was 9.2.  A hemoccult was not obtained prior to discharge because he did not have a BM.    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  He was converted to carvedilol 6.25 mg BID for blood pressure control in the setting of intermittent NSVT, however, which may have been exacerbated by hydralazine.   Elevated alkaline phosphatase  Likely secondary to bone resorption due to bone metastases  Consultants:  none Procedures:  PRBC x 4 Antibiotics:  none    Discharge Exam: Filed Vitals:   04/28/12 0620  BP: 143/91  Pulse: 66  Temp: 99 F (37.2 C)  Resp: 20   Filed Vitals:   04/27/12 2359 04/28/12 0059 04/28/12 0130 04/28/12 0620  BP: 145/90 151/79 147/80 143/91  Pulse: 76 67 68 66  Temp: 98.7 F (37.1 C) 97.6 F (36.4 C) 98.6 F (37 C) 99 F (37.2 C)  TempSrc: Oral Oral Oral Oral  Resp: 20 20 20 20   Height:      Weight:    94.8 kg (208 lb 15.9 oz)  SpO2:    100%  General: AAM, no acute distress HEENT: MMM  Cardiovascular: RRR, no mrg, 2+ pulses, warm extremities  Respiratory: faint rales at the bilateral bases that clear somewhat.  No rhonchi or wheeze, no increased WOB  Abdomen: NABS, soft, nondistended, nontender  MSK: Normal tone and bulk, trace LEE Neuro: Grossly intact   Discharge Instructions      Discharge Orders   Future Appointments Provider Department Dept Phone   04/29/2012 12:00 PM [facility] [phone]   04/29/2012 1:00 PM [facility] [place] [facility] [phone]   05/06/2012 2:15 PM [facility] [phone]   05/06/2012 2:45 PM Myrtis Ser, NP [facility] [phone]   Future Orders Complete By Expires     (HEART FAILURE PATIENTS) Call MD:  Anytime you have any of the following symptoms: 1) 3 pound weight gain in 24 hours or 5 pounds in 1 week 2) shortness of breath, with or without a dry hacking cough 3) swelling in the hands, feet or stomach 4) if you have to sleep on extra pillows at night in order to breathe.  As directed     Call MD for:  difficulty breathing, headache or visual disturbances  As directed     Call MD for:  extreme fatigue  As directed     Call MD for:  hives  As directed     Call MD for:  persistant dizziness or light-headedness  As directed     Call MD for:  persistant nausea and vomiting  As directed     Call MD for:  severe uncontrolled pain  As directed     Call MD for:  temperature >100.4  As directed     Diet - low sodium heart healthy  As directed     Discharge instructions  As directed     Comments:      You were hospitalized with chest pain and shortness of breath which improved with blood transfusions.  Your tests for heart attack were negative.  You should have your blood checked by oncology at your already scheduled appointment.  For some mild increase in shortness of breath, you were given a dose of lasix. You should weigh yourself daily and if you gain more than 3-lbs in one day or 5-lbs in one week, please call your primary care doctor.  You have grade 1 diastolic heart failure and may be predisposed to holding on to fluid which can cause shortness of breath.  Make sure you do not eat more than 2gm of sodium in one day.  For your blood pressure, you were started on carvedilol.  Please  follow up with your primary care doctor for a blood pressure check in 1-2 weeks.    Increase activity slowly  As directed         Medication List    TAKE these medications       acetaminophen 500 MG tablet  Commonly known as:  TYLENOL  Take 500 mg by mouth every 6 (six) hours as needed for pain.     carvedilol 6.25 MG tablet  Commonly known as:  COREG  Take 1 tablet (6.25 mg total) by mouth 2 (two) times daily with a meal.     HYDROmorphone 4 MG tablet  Commonly known as:  DILAUDID  Take 1 tablet (4 mg total) by mouth every 4 (four) hours as needed. Script refilled  by Dr. Roselind Messier 03/13/2012 qty 30 no refills original script given by ED provider          The results of significant diagnostics from this hospitalization (including imaging, microbiology, ancillary and laboratory) are listed below for reference.    Significant Diagnostic Studies: Dg Chest Portable 1 View  04/26/2012  *RADIOLOGY REPORT*  Clinical Data: Chestine [place] of breath and chest pain  PORTABLE CHEST - 1 VIEW  Comparison: 03/01/2012  Findings: The lung volumes are decreased.  The heart size is mildly enlarged.  No effusions or interstitial edema.  There is no airspace consolidation identified. The visualized osseous structures appear intact.  IMPRESSION:  1.  Low lung volumes. 2.  Cardiac enlargement without heart failure.   Original Report Authenticated By: Signa Kell, M.D.     Microbiology: No results found for this or any previous visit (from the past 240 hour(s)).   Labs: Basic Metabolic Panel:  Recent Labs Lab 04/22/12 1533 04/26/12 2018 04/27/12 0806 04/28/12 0810  NA 139 135 133* 137  K 4.0 3.5 3.4* 3.5  CL 103 98 98 102  CO2 22 20 22 22   GLUCOSE 119* 105* 131* 107*  BUN 14.3 10 9 8   CREATININE 0.8 0.66 0.70 0.67  CALCIUM 8.7 7.9* 7.7* 8.4  MG  --   --  2.0 2.0  PHOS  --   --  2.4  --    Liver Function Tests:  Recent Labs Lab 04/22/12 1533 04/26/12 2018 04/27/12 0806  AST 25 12 12   ALT  56* 21 19  ALKPHOS 468* 354* 349*  BILITOT 0.30 0.3 0.3  PROT 8.3 7.6 7.5  ALBUMIN 3.0* 3.2* 3.2*   No results found for this basename: LIPASE, AMYLASE,  in the last 168 hours No results found for this basename: AMMONIA,  in the last 168 hours CBC:  Recent Labs Lab 04/22/12 1533 04/26/12 2018 04/27/12 0806 04/28/12 0810  WBC 7.4 9.5 8.7 7.8  NEUTROABS 5.4 7.4 6.4  --   HGB 5.5* 5.4* 6.7* 9.2*  HCT 17.6* 18.2* 22.2* 29.2*  MCV 79.0* 82.0 82.2 82.7  PLT 271 252 201 209   Cardiac Enzymes:  Recent Labs Lab 04/26/12 2018  TROPONINI <0.30   BNP: BNP (last 3 results)  Recent Labs  01/14/12 0933 02/29/12 0329  PROBNP 12.6 40.3   CBG:  Recent Labs Lab 04/27/12 1143 04/28/12 0737  GLUCAP 111* 101*    Time coordinating discharge: 45 minutes  Signed:  Harlow Basley  [facility] 04/28/2012, 11:12 AM

## 2012-04-27 NOTE — Progress Notes (Signed)
[facility] PROGRESS NOTE  Stephen Stokes:[id number] DOB: 1955/12/27 DOA: 04/26/2012 PCP: Stephen Junes, MD  Assessment/Plan  Midsternal chest pain and shortness of breath Telemetry demonstrates occasional tachycardia and SVT. Likely related to symptomatic anemia related to chemotherapy  Cardiac enzymes x 1 set negative. EKG 12 lead with normal sinus rhythm.  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  He continues to have a crampy feeling in his legs.  Denies N/V/D/C.  Has been able to eat okay.    Objective: Filed Vitals:   04/27/12 0340 04/27/12 0440 04/27/12 0500 04/27/12 0530  BP: 113/73 127/74  117/75  Pulse: 75 72  74  Temp: 98.3 F (36.8 C) 98.4 F (36.9 C)  98.5 F (36.9 C)  TempSrc: Oral Oral  Oral  Resp: 18 18  18   Height:      Weight:   93.1 kg (205 lb 4 oz)   SpO2:        Intake/Output Summary (Last 24 hours) at 04/27/12 1413 Last data filed at 04/27/12 0700  Gross per 24 hour  Intake 1725.5 ml  Output    150 ml  Net 1575.5 ml   Filed Weights   04/27/12 0120 04/27/12 0500  Weight: 92.7 kg (204 lb 5.9 oz) 93.1 kg (205 lb 4 oz)    Exam:   General:  AAM, no acute  distress, eating lunch  HEENT: MMM  Cardiovascular:  RRR, no mrg, 2+ pulses, warm extremities  Respiratory:  CTAB, no increased WOB  Abdomen:  NABS, soft, nondistended, nontender  MSK:  Normal tone and bulk  Neuro:  Grossly intact  Data Reviewed: Basic Metabolic Panel:  Recent Labs Lab 04/22/12 1533 04/26/12 2018 04/27/12 0806  NA 139 135 133*  K 4.0 3.5 3.4*  CL 103 98 98  CO2 22 20 22   GLUCOSE 119* 105* 131*  BUN 14.3 10 9   CREATININE 0.8 0.66 0.70  CALCIUM 8.7 7.9* 7.7*  MG  --   --  2.0  PHOS  --   --  2.4   Liver Function Tests:  Recent Labs Lab 04/22/12 1533 04/26/12 2018 04/27/12 0806  AST 25 12 12   ALT 56* 21 19  ALKPHOS 468* 354* 349*  BILITOT 0.30 0.3 0.3  PROT 8.3 7.6 7.5  ALBUMIN 3.0* 3.2* 3.2*   No results found for this basename: LIPASE, AMYLASE,  in the last 168 hours No results found for this basename: AMMONIA,  in the last 168 hours CBC:  Recent Labs Lab 04/22/12 1533 04/26/12 2018 04/27/12 0806  WBC 7.4 9.5 8.7  NEUTROABS 5.4 7.4 6.4  HGB 5.5* 5.4* 6.7*  HCT 17.6* 18.2* 22.2*  MCV 79.0* 82.0 82.2  PLT 271 252 201   Cardiac Enzymes:  Recent Labs Lab 04/26/12 2018  TROPONINI <0.30   BNP (last 3 results)  Recent Labs  01/14/12 0933 02/29/12 0329  PROBNP 12.6 40.3   CBG:  Recent Labs Lab 04/27/12 1143  GLUCAP 111*    No results found for this or any previous visit (from the past 240 hour(s)).   Studies: Dg Chest Portable 1 View  04/26/2012  *RADIOLOGY REPORT*  Clinical Data: Stephen Stokes of breath and chest pain  PORTABLE CHEST - 1 VIEW  Comparison: 03/01/2012  Findings: The lung volumes are decreased.  The heart size is mildly enlarged.  No effusions or interstitial edema.  There is no airspace consolidation identified. The visualized osseous structures appear intact.  IMPRESSION:  1.  Low lung volumes. 2.  Cardiac enlargement without heart failure.   Original Report Authenticated By: Stephen Stokes, M.D.      Scheduled Meds: . hydrALAZINE  10 mg Oral Q8H  . isosorbide mononitrate  15 mg Oral Daily  . sodium chloride  3 mL Intravenous Q12H   Continuous Infusions: . sodium chloride 75 mL/hr at 04/27/12 1610    Principal Problem:   Chest pain, midsternal Active Problems:   S/P TURP (status post transurethral resection of prostate)   H/O bilateral orchiectomies   Diverticulosis   Prostate cancer metastatic to multiple sites   HTN (hypertension)   Shortness of breath   Elevated alkaline phosphatase level    Time spent: 30 min    Stephen Stokes, [facility]  [facility] Pager [phone]. If 8PM-8AM, please contact night-coverage at [email or web address], password [facility] 04/27/2012, 2:13 PM  LOS: 1 day

## 2012-04-28 DIAGNOSIS — R072 Precordial pain: Secondary | ICD-10-CM

## 2012-04-28 LAB — TYPE AND SCREEN
Unit division: 0
Unit division: 0

## 2012-04-28 LAB — CBC
HCT: 29.2 % — ABNORMAL LOW (ref 39.0–52.0)
Platelets: 209 10*3/uL (ref 150–400)
RBC: 3.53 MIL/uL — ABNORMAL LOW (ref 4.22–5.81)
RDW: 18.3 % — ABNORMAL HIGH (ref 11.5–15.5)
WBC: 7.8 10*3/uL (ref 4.0–10.5)

## 2012-04-28 LAB — MAGNESIUM: Magnesium: 2 mg/dL (ref 1.5–2.5)

## 2012-04-28 LAB — BASIC METABOLIC PANEL
CO2: 22 mEq/L (ref 19–32)
Calcium: 8.4 mg/dL (ref 8.4–10.5)
GFR calc non Af Amer: >90 mL/min (ref >90)
Potassium: 3.5 mEq/L (ref 3.5–5.1)
Sodium: 137 mEq/L (ref 135–145)

## 2012-04-28 LAB — GLUCOSE, CAPILLARY: Glucose-Capillary: 105 mg/dL — ABNORMAL HIGH (ref 70–99)

## 2012-04-28 LAB — PATHOLOGIST SMEAR REVIEW

## 2012-04-28 MED ORDER — METOPROLOL TARTRATE 12.5 MG HALF TABLET
12.5000 mg | ORAL_TABLET | Freq: Two times a day (BID) | ORAL | Status: DC
  Administered 2012-04-28: 12.5 mg via ORAL
  Filled 2012-04-28 (×2): qty 1

## 2012-04-28 MED ORDER — FUROSEMIDE 10 MG/ML IJ SOLN
40.0000 mg | Freq: Once | INTRAMUSCULAR | Status: AC
  Administered 2012-04-28: 40 mg via INTRAVENOUS
  Filled 2012-04-28: qty 4

## 2012-04-28 MED ORDER — CARVEDILOL 6.25 MG PO TABS: 6.2500 mg | ORAL_TABLET | Freq: Two times a day (BID) | ORAL | Status: DC

## 2012-04-28 NOTE — Progress Notes (Signed)
Pt reported intake improved since admission 100% of meals on regular diet. Pt in process of being discharged during dietitian intern visit.   Levon Hedger MS, RD, LDN [phone] Pager [phone] After Hours Pager

## 2012-04-28 NOTE — Progress Notes (Signed)
Brief Nutrition Note  Pulled to the pt due to Malnutrition Screening Tool. Pt states that he will be d/c'ed later this afternoon which is consistent with the d/c summary which was entered yesterday.  Pt's BMI is currently 29.2, Overweight, and his weight is 209 pounds. Yet, per weight hx and pt report, he has lost a significant amount of weight recently  Wt Readings from Last 10 Encounters:   04/28/12  208 lb 15.9 oz (94.8 kg)   04/23/12  212 lb 11.2 oz (96.48 kg)   04/22/12  211 lb 4.8 oz (95.845 kg)   03/25/12  226 lb (102.513 kg)   03/21/12  227 lb 12.8 oz (103.329 kg)   03/20/12  226 lb 7 oz (102.711 kg)   03/14/12  230 lb 12.8 oz (104.69 kg)   03/01/12  232 lb 6.4 oz (105.416 kg)   02/20/12  248 lb 14.4 oz (112.9 kg)   02/13/12  248 lb 8 oz (112.719 kg)    He has lost ~40 pounds (16% body weight) in the past two and a half months.  This is significant and, in combination with his report of not eating much in the months PTA, meets criteria for severe malnutrition in the context of chronic illness.  Nutrition intervention is not warranted due to pt being d/c'ed.   Trenton Gammon Dietetic Intern # [phone]

## 2012-04-28 NOTE — Progress Notes (Signed)
Pt had a 5 beat run of [employer] paged. Pt was asymptomatic . There were no new orders placed at this time. I will continue to monitor the patient.

## 2012-04-28 NOTE — Progress Notes (Signed)
Pt had 4 beats of V tach.  MD notified.

## 2012-04-29 ENCOUNTER — Other Ambulatory Visit: Payer: Self-pay | Admitting: *Deleted

## 2012-04-29 ENCOUNTER — Ambulatory Visit: Payer: Self-pay

## 2012-04-29 ENCOUNTER — Other Ambulatory Visit (HOSPITAL_BASED_OUTPATIENT_CLINIC_OR_DEPARTMENT_OTHER): Payer: Self-pay

## 2012-04-29 DIAGNOSIS — C61 Malignant neoplasm of prostate: Secondary | ICD-10-CM

## 2012-04-29 LAB — CBC WITH DIFFERENTIAL/PLATELET
BASO%: 0.3 % (ref 0.0–2.0)
EOS%: 0.7 % (ref 0.0–7.0)
LYMPH%: 16.2 % (ref 14.0–49.0)
MCH: 26.4 pg — ABNORMAL LOW (ref 27.2–33.4)
MCHC: 31.5 g/dL — ABNORMAL LOW (ref 32.0–36.0)
MONO#: 0.5 10*3/uL (ref 0.1–0.9)
MONO%: 7.1 % (ref 0.0–14.0)
Platelets: 220 10*3/uL (ref 140–400)
RBC: 3.79 10*6/uL — ABNORMAL LOW (ref 4.20–5.82)
WBC: 7.2 10*3/uL (ref 4.0–10.3)
nRBC: 2 % — ABNORMAL HIGH (ref 0–0)

## 2012-04-29 NOTE — Progress Notes (Signed)
Spoke w/ pt and his 2 friends in lobby.  Informed of Hgb > 10 and no need for transfusion today.  Keep appt next week as scheduled.  He verbalized understanding.

## 2012-04-29 NOTE — Progress Notes (Signed)
Per Cameo- no need for treatment today.

## 2012-04-30 ENCOUNTER — Encounter (HOSPITAL_COMMUNITY)
Admission: RE | Admit: 2012-04-30 | Discharge: 2012-04-30 | Disposition: A | Discharge status: Home / Self Care | Payer: Self-pay | Source: Ambulatory Visit | Attending: Oncology | Admitting: Oncology

## 2012-05-05 ENCOUNTER — Telehealth: Payer: Self-pay | Admitting: Dietician

## 2012-05-06 ENCOUNTER — Other Ambulatory Visit (HOSPITAL_BASED_OUTPATIENT_CLINIC_OR_DEPARTMENT_OTHER): Payer: Self-pay | Admitting: Lab

## 2012-05-06 ENCOUNTER — Ambulatory Visit (HOSPITAL_BASED_OUTPATIENT_CLINIC_OR_DEPARTMENT_OTHER): Payer: Self-pay | Admitting: Oncology

## 2012-05-06 ENCOUNTER — Telehealth: Payer: Self-pay | Admitting: Oncology

## 2012-05-06 ENCOUNTER — Encounter: Payer: Self-pay | Admitting: Oncology

## 2012-05-06 VITALS — BP 152/99 | HR 88 | Temp 98.6°F | Resp 20 | Wt 212.2 lb

## 2012-05-06 DIAGNOSIS — C7951 Secondary malignant neoplasm of bone: Secondary | ICD-10-CM

## 2012-05-06 DIAGNOSIS — M949 Disorder of cartilage, unspecified: Secondary | ICD-10-CM

## 2012-05-06 DIAGNOSIS — C61 Malignant neoplasm of prostate: Secondary | ICD-10-CM

## 2012-05-06 DIAGNOSIS — C7952 Secondary malignant neoplasm of bone marrow: Secondary | ICD-10-CM

## 2012-05-06 DIAGNOSIS — D649 Anemia, unspecified: Secondary | ICD-10-CM

## 2012-05-06 LAB — CBC WITH DIFFERENTIAL/PLATELET
BASO%: 0.4 % (ref 0.0–2.0)
EOS%: 0.5 % (ref 0.0–7.0)
HCT: 28.9 % — ABNORMAL LOW (ref 38.4–49.9)
LYMPH%: 18.8 % (ref 14.0–49.0)
MCH: 26.7 pg — ABNORMAL LOW (ref 27.2–33.4)
MCHC: 32.5 g/dL (ref 32.0–36.0)
MCV: 82.1 fL (ref 79.3–98.0)
MONO%: 12 % (ref 0.0–14.0)
NEUT%: 68.3 % (ref 39.0–75.0)
Platelets: 271 10*3/uL (ref 140–400)
RBC: 3.52 10*6/uL — ABNORMAL LOW (ref 4.20–5.82)
WBC: 5.5 10*3/uL (ref 4.0–10.3)

## 2012-05-06 LAB — COMPREHENSIVE METABOLIC PANEL (CC13)
ALT: 26 U/L (ref 0–55)
Alkaline Phosphatase: 371 U/L — ABNORMAL HIGH (ref 40–150)
Creatinine: 0.8 mg/dL (ref 0.7–1.3)
Sodium: 140 mEq/L (ref 136–145)
Total Bilirubin: 0.32 mg/dL (ref 0.20–1.20)
Total Protein: 7.7 g/dL (ref 6.4–8.3)

## 2012-05-06 LAB — HOLD TUBE, BLOOD BANK

## 2012-05-06 NOTE — Telephone Encounter (Signed)
gv and printed appt schedule for pt for March....per [facility] put him back with her due to Dr. Clelia Croft not having available slo

## 2012-05-06 NOTE — Progress Notes (Signed)
Hematology and Oncology Follow Up Visit  Stephen Stokes [id number] 1955-09-22 57 y.o. 05/06/2012 8:19 PM   Principle Diagnosis: 57 year old with prostate cancer diagnosed in 03/2010. He presented with a PSA close to 1400 and back pain  And advanced disease. Gleason score was 4 + 3 equals 7.   Prior Therapy: He underwent a transurethral resection of the prostate procedure and bilateral orchiectomy. His Gleason score was 4 + 3 equals 7. Most recently he is developing a rise in his PSA despite castrate level of testosterone. He received Casodex as combined androgen deprivation, but stopped on his own. He Completed palliative radiation therapy for a total of 30 Gy on 03/25/2012.   Current therapy: Here to discuss treatment options.  Interim History:  Stephen Stokes presents today for a follow up visit. He is a nice man with the above diagnosis. He completed palliative radiation therapy to the spine with improvement in his overall pain. He is ambulate better and not using a walker on a wheelchair. He is reporting shortness of breath with activity, but still able to walk short distances. No chest pain or dizziness.  He is not really taking any medication and he report that he can not take any since he can not afford it. Despite doing poorly, he slightly better than last visit. He received 2 units PRBC for symptomatic anemia since his last visit.  Medications: I have reviewed the patient's current medications. Current outpatient prescriptions:acetaminophen (TYLENOL) 500 MG tablet, Take 500 mg by mouth every 6 (six) hours as needed for pain., Disp: , Rfl: ;  carvedilol (COREG) 6.25 MG tablet, Take 1 tablet (6.25 mg total) by mouth 2 (two) times daily with a meal., Disp: 60 tablet, Rfl: 1;  enzalutamide (XTANDI) 40 MG capsule, Take 160 mg by mouth daily. Patient takes 4 pills daily to equal 160 mg, Disp: , Rfl:  HYDROmorphone (DILAUDID) 4 MG tablet, Take 1 tablet (4 mg total) by mouth every 4 (four) hours as  needed. Script refilled by Dr. Roselind Messier 03/13/2012 qty 30 no refills original script given by ED provider, Disp: 30 tablet, Rfl: 0  Allergies: No Known Allergies  Past Medical History, Surgical history, Social history, and Family History were reviewed and updated.  Review of Systems: Constitutional:  Negative for fever, chills, night sweats, anorexia, weight loss, pain. Cardiovascular: no chest pain or dyspnea on exertion Respiratory: negative Neurological: negative Dermatological: negative ENT: negative Skin: Negative. Gastrointestinal: negative Genito-Urinary: negative Hematological and Lymphatic: negative Breast: negative Musculoskeletal: negative Remaining ROS negative.  Physical Exam: Blood pressure 152/99, pulse 88, temperature 98.6 F (37 C), temperature source Oral, resp. rate 20, weight 212 lb 3 oz (96.248 kg). ECOG: 3 General appearance: alert Head: Normocephalic, without obvious abnormality, atraumatic Neck: no adenopathy, no carotid bruit, no JVD, supple, symmetrical, trachea midline and thyroid not enlarged, symmetric, no tenderness/mass/nodules Lymph nodes: Cervical, supraclavicular, and axillary nodes normal. Heart:regular rate and rhythm, S1, S2 normal, no murmur, click, rub or gallop Lung:chest clear, no wheezing, rales, normal symmetric air entry Abdomen: soft, non-tender, without masses or organomegaly EXT:no erythema, induration, or nodules   Lab Results: Lab Results  Component Value Date   WBC 5.5 05/06/2012   HGB 9.4* 05/06/2012   HCT 28.9* 05/06/2012   MCV 82.1 05/06/2012   PLT 271 05/06/2012     Chemistry      Component Value Date/Time   NA 140 05/06/2012 1432   NA 137 04/28/2012 0810   K 4.1 05/06/2012 1432   K 3.5  04/28/2012 0810   CL 104 05/06/2012 1432   CL 102 04/28/2012 0810   CO2 24 05/06/2012 1432   CO2 22 04/28/2012 0810   BUN 12.4 05/06/2012 1432   BUN 8 04/28/2012 0810   CREATININE 0.8 05/06/2012 1432   CREATININE 0.67 04/28/2012 0810       Component Value Date/Time   CALCIUM 9.4 05/06/2012 1432   CALCIUM 8.4 04/28/2012 0810   ALKPHOS 371* 05/06/2012 1432   ALKPHOS 349* 04/27/2012 0806   AST 17 05/06/2012 1432   AST 12 04/27/2012 0806   ALT 26 05/06/2012 1432   ALT 19 04/27/2012 0806   BILITOT 0.32 05/06/2012 1432   BILITOT 0.3 04/27/2012 0806       Impression and Plan: This is a 57 year old gentleman with the following issues:  1. Castration-resistant prostate cancer. He was diagnosed initially in January 2012, presented with a PSA close to 1400. He is S/P  bilateral orchiectomy and currently on casodex which he is not taking. His last PSA was 505 which is up from 450. Options of treatment discussed today. He has been non compliant with his medications in the past. Discussed Xtandi versus chemotherapy. We are able to obtain a 1 month supply of this medication and will plan to have him start 160 mg daily. Side effects discussed including increased risk for infection, GI issues, and increased risk of seizure. He is willing to proceed.  2. Bone pain: Improved with XRT. Patient states he is receiving monthly Xgeva at [facility].   3. 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 S/P 2 units PRBc with improvement in his Hgb. No active bleeding. No transfusion indicated.  4. Psychosocial: He met with SW today to discuss establishing with PCP and applying for Medicaid.  5. Follow-up: 1 month to assess toxicity.  Clenton Pare 2/25/20148:19 PM

## 2012-05-06 NOTE — Patient Instructions (Addendum)
Xtandi 4 tablets daily.

## 2012-06-02 ENCOUNTER — Ambulatory Visit (HOSPITAL_BASED_OUTPATIENT_CLINIC_OR_DEPARTMENT_OTHER): Payer: Medicaid Other | Admitting: Oncology

## 2012-06-02 ENCOUNTER — Other Ambulatory Visit (HOSPITAL_BASED_OUTPATIENT_CLINIC_OR_DEPARTMENT_OTHER): Payer: Medicaid Other | Admitting: Lab

## 2012-06-02 ENCOUNTER — Encounter: Payer: Self-pay | Admitting: Oncology

## 2012-06-02 ENCOUNTER — Telehealth: Payer: Self-pay | Admitting: Oncology

## 2012-06-02 VITALS — BP 146/94 | HR 70 | Temp 96.8°F | Resp 20 | Ht 71.0 in | Wt 215.9 lb

## 2012-06-02 DIAGNOSIS — C61 Malignant neoplasm of prostate: Secondary | ICD-10-CM

## 2012-06-02 DIAGNOSIS — D649 Anemia, unspecified: Secondary | ICD-10-CM

## 2012-06-02 DIAGNOSIS — M949 Disorder of cartilage, unspecified: Secondary | ICD-10-CM

## 2012-06-02 LAB — COMPREHENSIVE METABOLIC PANEL (CC13)
BUN: 18.4 mg/dL (ref 7.0–26.0)
CO2: 19 mEq/L — ABNORMAL LOW (ref 22–29)
Calcium: 8.8 mg/dL (ref 8.4–10.4)
Chloride: 108 mEq/L — ABNORMAL HIGH (ref 98–107)
Creatinine: 0.7 mg/dL (ref 0.7–1.3)
Total Bilirubin: 0.31 mg/dL (ref 0.20–1.20)

## 2012-06-02 LAB — CBC WITH DIFFERENTIAL/PLATELET
BASO%: 0.3 % (ref 0.0–2.0)
Basophils Absolute: 0 10*3/uL (ref 0.0–0.1)
EOS%: 1.1 % (ref 0.0–7.0)
HCT: 30.7 % — ABNORMAL LOW (ref 38.4–49.9)
HGB: 9.5 g/dL — ABNORMAL LOW (ref 13.0–17.1)
LYMPH%: 22.9 % (ref 14.0–49.0)
MCH: 27.4 pg (ref 27.2–33.4)
MCHC: 30.9 g/dL — ABNORMAL LOW (ref 32.0–36.0)
MONO#: 0.5 10*3/uL (ref 0.1–0.9)
NEUT%: 69.1 % (ref 39.0–75.0)
Platelets: 215 10*3/uL (ref 140–400)
lymph#: 1.7 10*3/uL (ref 0.9–3.3)

## 2012-06-02 LAB — PSA: PSA: 57.76 ng/mL — ABNORMAL HIGH (ref <=4.00)

## 2012-06-02 MED ORDER — ENZALUTAMIDE 40 MG PO CAPS: 160.0000 mg | ORAL_CAPSULE | Freq: Every day | ORAL | Status: DC

## 2012-06-02 NOTE — Progress Notes (Signed)
Faxed zytiga application to [employer].

## 2012-06-02 NOTE — Progress Notes (Signed)
Hematology and Oncology Follow Up Visit  Vanderbilt Ranieri [id number] [birth year] 57 y.o. 06/02/2012 4:29 PM   Principle Diagnosis: 57 year old with prostate cancer diagnosed in 03/2010. He presented with a PSA close to 1400 and back pain  And advanced disease. Gleason score was 4 + 3 equals 7.   Prior Therapy: He underwent a transurethral resection of the prostate procedure and bilateral orchiectomy. His Gleason score was 4 + 3 equals 7. Most recently he is developing a rise in his PSA despite castrate level of testosterone. He received Casodex as combined androgen deprivation, but stopped on his own. He Completed palliative radiation therapy for a total of 30 Gy on 03/25/2012.   Current therapy: Xtandi 160 mg daily started February 2014.  Interim History:  Mr. Choe presents today for a follow up visit. He is a nice man with the above diagnosis. He completed palliative radiation therapy to the spine with improvement in his overall pain. He is ambulate better and not using a walker on a wheelchair. Started Xtandi and tolerating well. No chest pain, SOB, DOE. No abdominal pain, nausea, vomiting. No bleeding. No seizures.  Medications: I have reviewed the patient's current medications. Current outpatient prescriptions:acetaminophen (TYLENOL) 500 MG tablet, Take 500 mg by mouth every 6 (six) hours as needed for pain., Disp: , Rfl: ;  carvedilol (COREG) 6.25 MG tablet, Take 1 tablet (6.25 mg total) by mouth 2 (two) times daily with a meal., Disp: 60 tablet, Rfl: 1;  enzalutamide (XTANDI) 40 MG capsule, Take 4 capsules (160 mg total) by mouth daily. Patient takes 4 pills daily to equal 160 mg, Disp: 120 capsule, Rfl: 1  Allergies: No Known Allergies  Past Medical History, Surgical history, Social history, and Family History were reviewed and updated.  Review of Systems: Constitutional:  Negative for fever, chills, night sweats, anorexia, weight loss, pain. Cardiovascular: no chest pain or dyspnea on  exertion Respiratory: negative Neurological: negative Dermatological: negative ENT: negative Skin: Negative. Gastrointestinal: negative Genito-Urinary: negative Hematological and Lymphatic: negative Breast: negative Musculoskeletal: negative Remaining ROS negative.  Physical Exam: Blood pressure 146/94, pulse 70, temperature 96.8 F (36 C), temperature source Oral, resp. rate 20, height 5\' 11"  (1.803 m), weight 215 lb 14.4 oz (97.932 kg). ECOG: 2 General appearance: alert Head: Normocephalic, without obvious abnormality, atraumatic Neck: no adenopathy, no carotid bruit, no JVD, supple, symmetrical, trachea midline and thyroid not enlarged, symmetric, no tenderness/mass/nodules Lymph nodes: Cervical, supraclavicular, and axillary nodes normal. Heart:regular rate and rhythm, S1, S2 normal, no murmur, click, rub or gallop Lung:chest clear, no wheezing, rales, normal symmetric air entry Abdomen: soft, non-tender, without masses or organomegaly EXT:no erythema, induration, or nodules   Lab Results: Lab Results  Component Value Date   WBC 7.4 06/02/2012   HGB 9.5* 06/02/2012   HCT 30.7* 06/02/2012   MCV 88.5 06/02/2012   PLT 215 06/02/2012     Chemistry      Component Value Date/Time   NA 137 06/02/2012 1405   NA 137 04/28/2012 0810   K 4.3 06/02/2012 1405   K 3.5 04/28/2012 0810   CL 108* 06/02/2012 1405   CL 102 04/28/2012 0810   CO2 19* 06/02/2012 1405   CO2 22 04/28/2012 0810   BUN 18.4 06/02/2012 1405   BUN 8 04/28/2012 0810   CREATININE 0.7 06/02/2012 1405   CREATININE 0.67 04/28/2012 0810      Component Value Date/Time   CALCIUM 8.8 06/02/2012 1405   CALCIUM 8.4 04/28/2012 0810   ALKPHOS 477*  06/02/2012 1405   ALKPHOS 349* 04/27/2012 0806   AST 9 06/02/2012 1405   AST 12 04/27/2012 0806   ALT 11 06/02/2012 1405   ALT 19 04/27/2012 0806   BILITOT 0.31 06/02/2012 1405   BILITOT 0.3 04/27/2012 0806     Results for ONIEL, MELESKI (MRN [id number]) as of 06/02/2012 14:31  Ref. Range  04/07/2010 06:30 01/14/2012 17:44 02/13/2012 13:22 03/18/2012 15:16 04/22/2012 15:33  PSA Latest Range: <=4.00 ng/mL 1432.58... (H) 148.20 (H) 450.60 (H) 538.10 (H) 505.80 (H)    Impression and Plan: This is a 57 year old gentleman with the following issues:  1. Castration-resistant prostate cancer. He was diagnosed initially in January 2012, presented with a PSA close to 1400. He is S/P bilateral orchiectomy. He was started on Xtandi and he is tolerating this well. We do not have any additional samples of this drug to give to him. I have given a prescription to our managed care department to see if he qualifies for free drug. He is also applying for Medicaid later this week. If we cannot gt more Xtandi for him, then we will discuss other treatment options with him.  2. Bone pain: Improved with XRT. Patient states he is receiving monthly Xgeva at [facility].   3. 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 No active bleeding. No transfusion indicated.  4. Follow-up: 4-5 weeks.  Clenton Pare 3/24/20144:29 PM

## 2012-06-02 NOTE — Telephone Encounter (Signed)
Gave pt appt for lab and MD on May 1st ok per ML

## 2012-06-03 ENCOUNTER — Telehealth: Payer: Self-pay | Admitting: *Deleted

## 2012-06-03 ENCOUNTER — Other Ambulatory Visit: Payer: Self-pay | Admitting: Lab

## 2012-06-03 ENCOUNTER — Encounter: Payer: Self-pay | Admitting: Oncology

## 2012-06-03 NOTE — Telephone Encounter (Signed)
Spoke with patient, gave PSA and told him we are working on getting him more xtandi thru the [facility], [employer].

## 2012-06-03 NOTE — Progress Notes (Signed)
Patient is approved for free zytiga through [employer], 06/03/12-06/03/13.  He should receive a drug card in the mail within 5 days that he can take to a pharmacy, preferably [facility], to obtain the free medication.

## 2012-06-03 NOTE — Telephone Encounter (Signed)
Message copied by Reesa Chew on Tue Jun 03, 2012  4:00 PM ------      Message from: Myrtis Ser      Created: Tue Jun 03, 2012  2:21 PM       Call pt with PSA result. Let him know we are working on getting him Xtandi through the [facility], [facility]. ------

## 2012-06-04 ENCOUNTER — Encounter: Payer: Self-pay | Admitting: Oncology

## 2012-06-04 ENCOUNTER — Ambulatory Visit: Payer: Self-pay | Admitting: Oncology

## 2012-06-04 NOTE — Progress Notes (Signed)
CORRECTION:::: Patient is approved for xtandi through [employer] from 06/03/12-06/03/13; they will call the patient to set up shipment.

## 2012-06-04 NOTE — Progress Notes (Signed)
Ms. Stephen Stokes left mess from DSS. Patient has to reapply for medicaid. He doesn't have a deductible. I called and told him he needed to go and apply again for medicaid per social worker. He said he was planning on doing for sure by Monday.

## 2012-06-17 ENCOUNTER — Ambulatory Visit: Payer: Self-pay | Admitting: Oncology

## 2012-06-17 ENCOUNTER — Other Ambulatory Visit: Payer: Self-pay | Admitting: Lab

## 2012-07-10 ENCOUNTER — Telehealth: Payer: Self-pay | Admitting: Oncology

## 2012-07-10 ENCOUNTER — Other Ambulatory Visit (HOSPITAL_BASED_OUTPATIENT_CLINIC_OR_DEPARTMENT_OTHER): Payer: Medicaid Other | Admitting: Lab

## 2012-07-10 ENCOUNTER — Ambulatory Visit (HOSPITAL_BASED_OUTPATIENT_CLINIC_OR_DEPARTMENT_OTHER): Payer: Medicaid Other | Admitting: Oncology

## 2012-07-10 VITALS — BP 171/111 | HR 99 | Temp 98.1°F | Resp 19 | Ht 71.0 in | Wt 220.5 lb

## 2012-07-10 DIAGNOSIS — C61 Malignant neoplasm of prostate: Secondary | ICD-10-CM

## 2012-07-10 DIAGNOSIS — C800 Disseminated malignant neoplasm, unspecified: Secondary | ICD-10-CM

## 2012-07-10 DIAGNOSIS — T451X5A Adverse effect of antineoplastic and immunosuppressive drugs, initial encounter: Secondary | ICD-10-CM

## 2012-07-10 DIAGNOSIS — R52 Pain, unspecified: Secondary | ICD-10-CM

## 2012-07-10 LAB — CBC WITH DIFFERENTIAL/PLATELET
Basophils Absolute: 0 10*3/uL (ref 0.0–0.1)
Eosinophils Absolute: 0.1 10*3/uL (ref 0.0–0.5)
HGB: 11 g/dL — ABNORMAL LOW (ref 13.0–17.1)
MONO#: 0.6 10*3/uL (ref 0.1–0.9)
MONO%: 8.6 % (ref 0.0–14.0)
NEUT#: 4.9 10*3/uL (ref 1.5–6.5)
RBC: 3.61 10*6/uL — ABNORMAL LOW (ref 4.20–5.82)
RDW: 21.7 % — ABNORMAL HIGH (ref 11.0–14.6)
WBC: 7.3 10*3/uL (ref 4.0–10.3)
lymph#: 1.6 10*3/uL (ref 0.9–3.3)

## 2012-07-10 LAB — COMPREHENSIVE METABOLIC PANEL (CC13)
Albumin: 4 g/dL (ref 3.5–5.0)
Alkaline Phosphatase: 385 U/L — ABNORMAL HIGH (ref 40–150)
BUN: 20.5 mg/dL (ref 7.0–26.0)
CO2: 20 mEq/L — ABNORMAL LOW (ref 22–29)
Calcium: 8.4 mg/dL (ref 8.4–10.4)
Chloride: 107 mEq/L (ref 98–107)
Glucose: 112 mg/dl — ABNORMAL HIGH (ref 70–99)
Potassium: 4.2 mEq/L (ref 3.5–5.1)
Sodium: 138 mEq/L (ref 136–145)
Total Protein: 7.7 g/dL (ref 6.4–8.3)

## 2012-07-10 NOTE — Telephone Encounter (Signed)
gve the pt his June 2014 appt calendar

## 2012-07-10 NOTE — Progress Notes (Signed)
Hematology and Oncology Follow Up Visit  Ramesses Crampton [id number] 06-Jan-1956 57 y.o. 07/10/2012 2:54 PM   Principle Diagnosis: 57 year old with prostate cancer diagnosed in 03/2010. He presented with a PSA close to 1400 and back pain  And advanced disease. Gleason score was 4 + 3 equals 7.   Prior Therapy: He underwent a transurethral resection of the prostate procedure and bilateral orchiectomy. His Gleason score was 4 + 3 equals 7. Most recently he is developing a rise in his PSA despite castrate level of testosterone. He received Casodex as combined androgen deprivation, but stopped on his own. He Completed palliative radiation therapy for a total of 30 Gy on 03/25/2012.   Current therapy: Xtandi 160 mg daily started February 2014.  Interim History:  Mr. Gander presents today for a follow up visit. He is a nice man with the above diagnosis. He completed palliative radiation therapy to the spine with improvement in his overall pain. He is ambulate better and not using a walker on a wheelchair. Started Xtandi and tolerating well. No chest pain, SOB, DOE. No abdominal pain, nausea, vomiting. No bleeding. No seizures. He is eating a lot better and continued improvement in his overall health. He has no new pain at this time.   Medications: I have reviewed the patient's current medications. Current outpatient prescriptions:acetaminophen (TYLENOL) 500 MG tablet, Take 500 mg by mouth every 6 (six) hours as needed for pain., Disp: , Rfl: ;  carvedilol (COREG) 6.25 MG tablet, Take 1 tablet (6.25 mg total) by mouth 2 (two) times daily with a meal., Disp: 60 tablet, Rfl: 1;  enzalutamide (XTANDI) 40 MG capsule, Take 4 capsules (160 mg total) by mouth daily. Patient takes 4 pills daily to equal 160 mg, Disp: 120 capsule, Rfl: 1  Allergies: No Known Allergies  Past Medical History, Surgical history, Social history, and Family History were reviewed and updated.  Review of Systems: Constitutional:  Negative  for fever, chills, night sweats, anorexia, weight loss, pain. Cardiovascular: no chest pain or dyspnea on exertion Respiratory: negative Neurological: negative Dermatological: negative ENT: negative Skin: Negative. Gastrointestinal: negative Genito-Urinary: negative Hematological and Lymphatic: negative Breast: negative Musculoskeletal: negative Remaining ROS negative.  Physical Exam: Blood pressure 171/111, pulse 99, temperature 98.1 F (36.7 C), temperature source Oral, resp. rate 19, height 5\' 11"  (1.803 m), weight 220 lb 8 oz (100.018 kg). ECOG: 2 General appearance: alert Head: Normocephalic, without obvious abnormality, atraumatic Neck: no adenopathy, no carotid bruit, no JVD, supple, symmetrical, trachea midline and thyroid not enlarged, symmetric, no tenderness/mass/nodules Lymph nodes: Cervical, supraclavicular, and axillary nodes normal. Heart:regular rate and rhythm, S1, S2 normal, no murmur, click, rub or gallop Lung:chest clear, no wheezing, rales, normal symmetric air entry Abdomen: soft, non-tender, without masses or organomegaly EXT:no erythema, induration, or nodules   Lab Results: Lab Results  Component Value Date   WBC 7.3 07/10/2012   HGB 11.0* 07/10/2012   HCT 33.4* 07/10/2012   MCV 92.3 07/10/2012   PLT 276 07/10/2012     Chemistry      Component Value Date/Time   NA 137 06/02/2012 1405   NA 137 04/28/2012 0810   K 4.3 06/02/2012 1405   K 3.5 04/28/2012 0810   CL 108* 06/02/2012 1405   CL 102 04/28/2012 0810   CO2 19* 06/02/2012 1405   CO2 22 04/28/2012 0810   BUN 18.4 06/02/2012 1405   BUN 8 04/28/2012 0810   CREATININE 0.7 06/02/2012 1405   CREATININE 0.67 04/28/2012 0810  Component Value Date/Time   CALCIUM 8.8 06/02/2012 1405   CALCIUM 8.4 04/28/2012 0810   ALKPHOS 477* 06/02/2012 1405   ALKPHOS 349* 04/27/2012 0806   AST 9 06/02/2012 1405   AST 12 04/27/2012 0806   ALT 11 06/02/2012 1405   ALT 19 04/27/2012 0806   BILITOT 0.31 06/02/2012 1405   BILITOT  0.3 04/27/2012 0806     Results for DARRAL, RISHEL (MRN [id number]) as of 07/10/2012 14:56  Ref. Range 04/22/2012 15:33 06/02/2012 14:05  PSA Latest Range: <=4.00 ng/mL 505.80 (H) 57.76 (H)     Impression and Plan: This is a 57 year old gentleman with the following issues:  1. Castration-resistant prostate cancer. He was diagnosed initially in January 2012, presented with a PSA close to 1400. He is S/P bilateral orchiectomy with good response.  He developed castration resistant disease with PSA up to 500.  He was started on Xtandi and he is tolerating this well.  His PSA dropped to 57 (effect of radiation plus Xtandi).  He is able to get Xtandi without problem, so the plan is to continue on it for now.   2. Bone pain: Improved with XRT. Patient states he is receiving monthly Xgeva at [facility].   3. 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 No active bleeding. No transfusion indicated. This has improved.   4. Follow-up: 5-6 weeks.  SHADAD,FIRAS 5/1/20142:54 PM

## 2012-07-23 ENCOUNTER — Other Ambulatory Visit (HOSPITAL_COMMUNITY): Payer: Self-pay | Admitting: Urology

## 2012-07-23 DIAGNOSIS — C61 Malignant neoplasm of prostate: Secondary | ICD-10-CM

## 2012-08-11 ENCOUNTER — Encounter (HOSPITAL_COMMUNITY)
Admission: RE | Admit: 2012-08-11 | Discharge: 2012-08-11 | Disposition: A | Discharge status: Home / Self Care | Payer: Medicare Other | Source: Ambulatory Visit | Attending: Urology | Admitting: Urology

## 2012-08-11 DIAGNOSIS — C61 Malignant neoplasm of prostate: Secondary | ICD-10-CM | POA: Insufficient documentation

## 2012-08-11 MED ORDER — TECHNETIUM TC 99M MEDRONATE IV KIT
25.0000 | PACK | Freq: Once | INTRAVENOUS | Status: AC | PRN
  Administered 2012-08-11: 25 via INTRAVENOUS

## 2012-08-21 ENCOUNTER — Other Ambulatory Visit: Payer: Self-pay | Admitting: Oncology

## 2012-08-28 ENCOUNTER — Encounter: Payer: Self-pay | Admitting: Oncology

## 2012-08-28 ENCOUNTER — Telehealth: Payer: Self-pay | Admitting: Oncology

## 2012-08-28 ENCOUNTER — Ambulatory Visit (HOSPITAL_BASED_OUTPATIENT_CLINIC_OR_DEPARTMENT_OTHER): Payer: Medicare Other | Admitting: Oncology

## 2012-08-28 ENCOUNTER — Ambulatory Visit (HOSPITAL_BASED_OUTPATIENT_CLINIC_OR_DEPARTMENT_OTHER): Payer: Medicare Other | Admitting: Lab

## 2012-08-28 ENCOUNTER — Other Ambulatory Visit (HOSPITAL_BASED_OUTPATIENT_CLINIC_OR_DEPARTMENT_OTHER): Payer: Medicare Other | Admitting: Lab

## 2012-08-28 VITALS — BP 145/90 | HR 84 | Temp 98.3°F | Resp 18 | Ht 71.0 in | Wt 219.3 lb

## 2012-08-28 DIAGNOSIS — M949 Disorder of cartilage, unspecified: Secondary | ICD-10-CM

## 2012-08-28 DIAGNOSIS — C61 Malignant neoplasm of prostate: Secondary | ICD-10-CM

## 2012-08-28 DIAGNOSIS — R319 Hematuria, unspecified: Secondary | ICD-10-CM

## 2012-08-28 DIAGNOSIS — C7951 Secondary malignant neoplasm of bone: Secondary | ICD-10-CM

## 2012-08-28 DIAGNOSIS — D649 Anemia, unspecified: Secondary | ICD-10-CM

## 2012-08-28 LAB — COMPREHENSIVE METABOLIC PANEL (CC13)
ALT: <6 U/L (ref 0–55)
AST: 10 U/L (ref 5–34)
Alkaline Phosphatase: 188 U/L — ABNORMAL HIGH (ref 40–150)
CO2: 19 mEq/L — ABNORMAL LOW (ref 22–29)
Creatinine: 0.8 mg/dL (ref 0.7–1.3)
Sodium: 136 mEq/L (ref 136–145)
Total Bilirubin: 0.43 mg/dL (ref 0.20–1.20)
Total Protein: 8 g/dL (ref 6.4–8.3)

## 2012-08-28 LAB — CBC WITH DIFFERENTIAL/PLATELET
BASO%: 0.3 % (ref 0.0–2.0)
EOS%: 1.5 % (ref 0.0–7.0)
HCT: 32.1 % — ABNORMAL LOW (ref 38.4–49.9)
LYMPH%: 15.7 % (ref 14.0–49.0)
MCH: 30.8 pg (ref 27.2–33.4)
MCHC: 33.4 g/dL (ref 32.0–36.0)
MCV: 92.3 fL (ref 79.3–98.0)
MONO%: 7.6 % (ref 0.0–14.0)
NEUT%: 74.9 % (ref 39.0–75.0)
Platelets: 353 10*3/uL (ref 140–400)
RBC: 3.48 10*6/uL — ABNORMAL LOW (ref 4.20–5.82)
WBC: 9.2 10*3/uL (ref 4.0–10.3)

## 2012-08-28 LAB — URINALYSIS, MICROSCOPIC - CHCC
Glucose: NEGATIVE mg/dL
Nitrite: NEGATIVE
Specific Gravity, Urine: 1.03 (ref 1.003–1.035)
Urobilinogen, UR: 0.2 mg/dL (ref 0.2–1)

## 2012-08-28 NOTE — Progress Notes (Signed)
Hematology and Oncology Follow Up Visit  Stephen Stokes [id number] 08-28-1955 57 y.o. 08/28/2012 4:34 PM   Principle Diagnosis: 56 year old with prostate cancer diagnosed in 03/2010. He presented with a PSA close to 1400 and back pain  And advanced disease. Gleason score was 4 + 3 equals 7.   Prior Therapy: He underwent a transurethral resection of the prostate procedure and bilateral orchiectomy. His Gleason score was 4 + 3 equals 7. Most recently he is developing a rise in his PSA despite castrate level of testosterone. He received Casodex as combined androgen deprivation, but stopped on his own. He Completed palliative radiation therapy for a total of 30 Gy on 03/25/2012.   Current therapy: Xtandi 160 mg daily started February 2014.  Interim History:  Stephen Stokes presents today for a follow up visit. He is a nice man with the above diagnosis. He completed palliative radiation therapy to the spine with improvement in his overall pain. He is ambulate better and not using a walker on a wheelchair. Started Xtandi and tolerating well. No chest pain, SOB, DOE. No abdominal pain, nausea, vomiting. No bleeding. No seizures. He is eating a lot better and continued improvement in his overall health. He has no new pain at this time. Reports hematuria for the past week. No dysuria.  Medications: I have reviewed the patient's current medications. Current outpatient prescriptions:acetaminophen (TYLENOL) 500 MG tablet, Take 500 mg by mouth every 6 (six) hours as needed for pain., Disp: , Rfl: ;  carvedilol (COREG) 6.25 MG tablet, Take 1 tablet (6.25 mg total) by mouth 2 (two) times daily with a meal., Disp: 60 tablet, Rfl: 1;  XTANDI 40 MG capsule, TAKE 4 CAPSULES (160MG ) BY MOUTH ONCE   A DAY. SWALLOW CAPSULE WHOLE.DO NOT    CHEW, DISSOLVE OR OPEN CAPSULE., Disp: 120 capsule, Rfl: 1  Allergies: No Known Allergies  Past Medical History, Surgical history, Social history, and Family History were reviewed and  updated.  Review of Systems: Constitutional:  Negative for fever, chills, night sweats, anorexia, weight loss, pain. Cardiovascular: no chest pain or dyspnea on exertion Respiratory: negative Neurological: negative Dermatological: negative ENT: negative Skin: Negative. Gastrointestinal: negative Genito-Urinary: negative Hematological and Lymphatic: negative Breast: negative Musculoskeletal: negative Remaining ROS negative.  Physical Exam: Blood pressure 145/90, pulse 84, temperature 98.3 F (36.8 C), temperature source Oral, resp. rate 18, height 5\' 11"  (1.803 m), weight 219 lb 4.8 oz (99.474 kg). ECOG: 1 General appearance: alert Head: Normocephalic, without obvious abnormality, atraumatic Neck: no adenopathy, no carotid bruit, no JVD, supple, symmetrical, trachea midline and thyroid not enlarged, symmetric, no tenderness/mass/nodules Lymph nodes: Cervical, supraclavicular, and axillary nodes normal. Heart:regular rate and rhythm, S1, S2 normal, no murmur, click, rub or gallop Lung:chest clear, no wheezing, rales, normal symmetric air entry Abdomen: soft, non-tender, without masses or organomegaly EXT:no erythema, induration, or nodules   Lab Results: Lab Results  Component Value Date   WBC 9.2 08/28/2012   HGB 10.7* 08/28/2012   HCT 32.1* 08/28/2012   MCV 92.3 08/28/2012   PLT 353 08/28/2012     Chemistry      Component Value Date/Time   NA 136 08/28/2012 1355   NA 137 04/28/2012 0810   K 4.1 08/28/2012 1355   K 3.5 04/28/2012 0810   CL 107 08/28/2012 1355   CL 102 04/28/2012 0810   CO2 19* 08/28/2012 1355   CO2 22 04/28/2012 0810   BUN 18.1 08/28/2012 1355   BUN 8 04/28/2012 0810   CREATININE  0.8 08/28/2012 1355   CREATININE 0.67 04/28/2012 0810      Component Value Date/Time   CALCIUM 9.0 08/28/2012 1355   CALCIUM 8.4 04/28/2012 0810   ALKPHOS 188* 08/28/2012 1355   ALKPHOS 349* 04/27/2012 0806   AST 10 08/28/2012 1355   AST 12 04/27/2012 0806   ALT <6 Repeated and  Verified 08/28/2012 1355   ALT 19 04/27/2012 0806   BILITOT 0.43 08/28/2012 1355   BILITOT 0.3 04/27/2012 0806     Results for Stephen, Stokes (MRN [id number]) as of 08/28/2012 14:35  Ref. 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 He is S/P bilateral orchiectomy with good response.  He developed castration resistant disease with PSA up to 500.  He was started on Xtandi and he is tolerating this well. His PSA dropped to 57 (effect of radiation plus Xtandi). PSA was up to 102.50 last month and is pending today. He is able to get Xtandi without problem, so the plan is to continue on it for now.   2. Bone pain: Improved with XRT. Patient states he is receiving monthly Xgeva at [facility].   3. 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 No active bleeding. No transfusion indicated. This has improved.   4. Hematuria: Will check U/A C&S and treat if UTI is indicated.   5. Follow-up: 5-6 weeks.  [place], [place] 6/19/20144:34 PM

## 2012-08-30 LAB — URINE CULTURE

## 2012-09-01 ENCOUNTER — Telehealth: Payer: Self-pay

## 2012-09-01 NOTE — Telephone Encounter (Signed)
Message copied by Kallie Locks on Mon Sep 01, 2012 12:41 PM ------      Message from: Stephen Stokes      Created: Mon Sep 01, 2012 12:31 PM       Please call pt. Urine culture is negative. He does not have a UTI. If he still has hematuria, then I recommend that he f/u with Urology. ------

## 2012-10-02 ENCOUNTER — Telehealth: Payer: Self-pay | Admitting: Oncology

## 2012-10-02 ENCOUNTER — Ambulatory Visit (HOSPITAL_BASED_OUTPATIENT_CLINIC_OR_DEPARTMENT_OTHER): Payer: Medicare Other | Admitting: Oncology

## 2012-10-02 ENCOUNTER — Other Ambulatory Visit (HOSPITAL_BASED_OUTPATIENT_CLINIC_OR_DEPARTMENT_OTHER): Payer: Medicare Other | Admitting: Lab

## 2012-10-02 VITALS — BP 154/91 | HR 83 | Temp 98.5°F | Resp 18 | Ht 71.0 in | Wt 227.8 lb

## 2012-10-02 DIAGNOSIS — C61 Malignant neoplasm of prostate: Secondary | ICD-10-CM

## 2012-10-02 DIAGNOSIS — C800 Disseminated malignant neoplasm, unspecified: Secondary | ICD-10-CM

## 2012-10-02 LAB — COMPREHENSIVE METABOLIC PANEL (CC13)
ALT: 7 U/L (ref 0–55)
AST: 12 U/L (ref 5–34)
Creatinine: 0.8 mg/dL (ref 0.7–1.3)
Total Bilirubin: 0.38 mg/dL (ref 0.20–1.20)

## 2012-10-02 LAB — CBC WITH DIFFERENTIAL/PLATELET
BASO%: 0.7 % (ref 0.0–2.0)
EOS%: 1.5 % (ref 0.0–7.0)
HCT: 28.7 % — ABNORMAL LOW (ref 38.4–49.9)
LYMPH%: 14.1 % (ref 14.0–49.0)
MCH: 29.4 pg (ref 27.2–33.4)
MCHC: 32.9 g/dL (ref 32.0–36.0)
MCV: 89.4 fL (ref 79.3–98.0)
MONO%: 6.9 % (ref 0.0–14.0)
NEUT%: 76.8 % — ABNORMAL HIGH (ref 39.0–75.0)
Platelets: 294 10*3/uL (ref 140–400)

## 2012-10-02 NOTE — Telephone Encounter (Signed)
gave pt appt for lab and Md on September 2014

## 2012-10-02 NOTE — Progress Notes (Signed)
Hematology and Oncology Follow Up Visit  Stephen Stokes [id number] 1955/09/14 58 y.o. 10/02/2012 1:14 PM   Principle Diagnosis: 58 year old with prostate cancer diagnosed in 03/2010. He presented with a PSA close to 1400 and back pain  And advanced disease. Gleason score was 4 + 3 equals 7.   Prior Therapy: He underwent a transurethral resection of the prostate procedure and bilateral orchiectomy. His Gleason score was 4 + 3 equals 7. Most recently he is developing a rise in his PSA despite castrate level of testosterone. He received Casodex as combined androgen deprivation, but stopped on his own. He Completed palliative radiation therapy for a total of 30 Gy on 03/25/2012.   Current therapy: Xtandi 160 mg daily started February 2014.  Interim History:  Stephen Stokes presents today for a follow up visit. He is a nice man with the above diagnosis. He completed palliative radiation therapy to the spine with improvement in his overall pain in 03/2012. He is ambulate better and not using a walker on a wheelchair. He has been on Xtandi and tolerating well without complications. No chest pain, SOB, DOE. No abdominal pain, nausea, vomiting. No bleeding. No seizures. He is eating a lot better and continued improvement in his overall health. He has no new pain at this time. He is still enjoying good quality of life.   Medications: I have reviewed the patient's current medications. Current Outpatient Prescriptions  Medication Sig Dispense Refill  . acetaminophen (TYLENOL) 500 MG tablet Take 500 mg by mouth every 6 (six) hours as needed for pain.      . carvedilol (COREG) 6.25 MG tablet Take 1 tablet (6.25 mg total) by mouth 2 (two) times daily with a meal.  60 tablet  1  . XTANDI 40 MG capsule TAKE 4 CAPSULES (160MG ) BY MOUTH ONCE   A DAY. SWALLOW CAPSULE WHOLE.DO NOT    CHEW, DISSOLVE OR OPEN CAPSULE.  120 capsule  1   No current facility-administered medications for this visit.    Allergies: No Known  Allergies  Past Medical History, Surgical history, Social history, and Family History were reviewed and updated.  Review of Systems: Constitutional:  Negative for fever, chills, night sweats, anorexia, weight loss, pain. Cardiovascular: no chest pain or dyspnea on exertion Respiratory: negative Neurological: negative Dermatological: negative ENT: negative Skin: Negative. Gastrointestinal: negative Genito-Urinary: negative Hematological and Lymphatic: negative Breast: negative Musculoskeletal: negative Remaining ROS negative.  Physical Exam: Blood pressure 154/91, pulse 83, temperature 98.5 F (36.9 C), temperature source Oral, resp. rate 18, height 5\' 11"  (1.803 m), weight 227 lb 12.8 oz (103.329 kg). ECOG: 1 General appearance: alert Head: Normocephalic, without obvious abnormality, atraumatic Neck: no adenopathy, no carotid bruit, no JVD, supple, symmetrical, trachea midline and thyroid not enlarged, symmetric, no tenderness/mass/nodules Lymph nodes: Cervical, supraclavicular, and axillary nodes normal. Heart:regular rate and rhythm, S1, S2 normal, no murmur, click, rub or gallop Lung:chest clear, no wheezing, rales, normal symmetric air entry Abdomen: soft, non-tender, without masses or organomegaly EXT:no erythema, induration, or nodules   Lab Results: Lab Results  Component Value Date   WBC 10.5* 10/02/2012   HGB 9.5* 10/02/2012   HCT 28.7* 10/02/2012   MCV 89.4 10/02/2012   PLT 294 10/02/2012     Chemistry      Component Value Date/Time   NA 136 08/28/2012 1355   NA 137 04/28/2012 0810   K 4.1 08/28/2012 1355   K 3.5 04/28/2012 0810   CL 107 08/28/2012 1355   CL 102  04/28/2012 0810   CO2 19* 08/28/2012 1355   CO2 22 04/28/2012 0810   BUN 18.1 08/28/2012 1355   BUN 8 04/28/2012 0810   CREATININE 0.8 08/28/2012 1355   CREATININE 0.67 04/28/2012 0810      Component Value Date/Time   CALCIUM 9.0 08/28/2012 1355   CALCIUM 8.4 04/28/2012 0810   ALKPHOS 188* 08/28/2012 1355    ALKPHOS 349* 04/27/2012 0806   AST 10 08/28/2012 1355   AST 12 04/27/2012 0806   ALT <6 Repeated and Verified 08/28/2012 1355   ALT 19 04/27/2012 0806   BILITOT 0.43 08/28/2012 1355   BILITOT 0.3 04/27/2012 0806     Results for Stephen, Stokes (MRN [id number]) as of 08/28/2012 14:35  Ref. 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 He is S/P bilateral orchiectomy with good response.  He developed castration resistant disease with PSA up to 500.  He was started on Xtandi and he is tolerating this well. His PSA dropped to 57 (effect of radiation plus Xtandi). PSA was up to 102.50 in 07/2012. His quality of life remains excellent on Xtandi and plan to keep on it till that changes. He is able to get Xtandi without problem.  If his PSA rises quickly and he is developing more pain, we can consider changing his medication then.   2. Bone pain: Improved with XRT. Patient states he is receiving monthly Xgeva at [facility].   3. 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 No active bleeding. No transfusion indicated. This has improved.   4. Hematuria: resolved now.  5. Follow-up: 5-6 weeks.  Stephen Stokes 7/24/20141:14 PM

## 2012-10-21 ENCOUNTER — Other Ambulatory Visit: Payer: Self-pay | Admitting: Oncology

## 2012-11-03 ENCOUNTER — Other Ambulatory Visit: Payer: Self-pay | Admitting: *Deleted

## 2012-11-03 ENCOUNTER — Telehealth: Payer: Self-pay | Admitting: *Deleted

## 2012-11-03 MED ORDER — HYDROCODONE-ACETAMINOPHEN 5-325 MG PO TABS: 1.0000 | ORAL_TABLET | Freq: Four times a day (QID) | ORAL | Status: DC | PRN

## 2012-11-03 NOTE — Telephone Encounter (Signed)
Called patient, script for vicodin called to [facility] [address]. Patient verbalizes understanding.

## 2012-11-03 NOTE — Telephone Encounter (Signed)
Patient calling to say the xtandi is making his legs hurt, like it is the bones and he is unable to sleep, has tried tylenol and aleve, not helping.note to kristin curcio np.

## 2012-11-12 ENCOUNTER — Telehealth: Payer: Self-pay | Admitting: Oncology

## 2012-11-12 ENCOUNTER — Other Ambulatory Visit (HOSPITAL_BASED_OUTPATIENT_CLINIC_OR_DEPARTMENT_OTHER): Payer: Medicare Other | Admitting: Lab

## 2012-11-12 ENCOUNTER — Encounter: Payer: Self-pay | Admitting: Oncology

## 2012-11-12 ENCOUNTER — Ambulatory Visit (HOSPITAL_BASED_OUTPATIENT_CLINIC_OR_DEPARTMENT_OTHER): Payer: Medicare Other | Admitting: Oncology

## 2012-11-12 ENCOUNTER — Ambulatory Visit: Payer: Medicaid Other | Admitting: Oncology

## 2012-11-12 VITALS — BP 156/86 | HR 96 | Temp 98.2°F | Resp 18 | Ht 71.0 in | Wt 229.0 lb

## 2012-11-12 DIAGNOSIS — C61 Malignant neoplasm of prostate: Secondary | ICD-10-CM

## 2012-11-12 DIAGNOSIS — C7951 Secondary malignant neoplasm of bone: Secondary | ICD-10-CM

## 2012-11-12 DIAGNOSIS — D6481 Anemia due to antineoplastic chemotherapy: Secondary | ICD-10-CM

## 2012-11-12 LAB — CBC WITH DIFFERENTIAL/PLATELET
BASO%: 0.3 % (ref 0.0–2.0)
EOS%: 1.3 % (ref 0.0–7.0)
HCT: 25.9 % — ABNORMAL LOW (ref 38.4–49.9)
MCH: 27.8 pg (ref 27.2–33.4)
MCHC: 32 g/dL (ref 32.0–36.0)
MCV: 86.8 fL (ref 79.3–98.0)
MONO%: 8.2 % (ref 0.0–14.0)
NEUT%: 74.7 % (ref 39.0–75.0)
RDW: 22 % — ABNORMAL HIGH (ref 11.0–14.6)
lymph#: 1.8 10*3/uL (ref 0.9–3.3)

## 2012-11-12 LAB — COMPREHENSIVE METABOLIC PANEL (CC13)
ALT: 6 U/L (ref 0–55)
AST: 13 U/L (ref 5–34)
Alkaline Phosphatase: 173 U/L — ABNORMAL HIGH (ref 40–150)
Calcium: 9.8 mg/dL (ref 8.4–10.4)
Chloride: 111 mEq/L — ABNORMAL HIGH (ref 98–109)
Creatinine: 1.1 mg/dL (ref 0.7–1.3)
Total Bilirubin: 0.23 mg/dL (ref 0.20–1.20)

## 2012-11-12 MED ORDER — OXYCODONE HCL 5 MG PO TABS: 5.0000 mg | ORAL_TABLET | ORAL | Status: DC | PRN

## 2012-11-12 MED ORDER — ABIRATERONE ACETATE 250 MG PO TABS: 1000.0000 mg | ORAL_TABLET | Freq: Every day | ORAL | Status: DC

## 2012-11-12 NOTE — Progress Notes (Signed)
Faxed zytiga prescription to [facility].

## 2012-11-12 NOTE — Telephone Encounter (Signed)
gave pt appt for lab and MD on 12/10/12

## 2012-11-12 NOTE — Progress Notes (Signed)
Hematology and Oncology Follow Up Visit  Stephen Stokes [id number] 1955/04/12 57 y.o. 11/12/2012 3:16 PM   Principle Diagnosis: 57 year old with prostate cancer diagnosed in 03/2010. He presented with a PSA close to 1400 and back pain  And advanced disease. Gleason score was 4 + 3 equals 7.   Prior Therapy: He underwent a transurethral resection of the prostate procedure and bilateral orchiectomy. His Gleason score was 4 + 3 equals 7. Most recently he is developing a rise in his PSA despite castrate level of testosterone. He received Casodex as combined androgen deprivation, but stopped on his own. He Completed palliative radiation therapy for a total of 30 Gy on 03/25/2012.   Current therapy: Xtandi 160 mg daily started February 2014.  Interim History:  Stephen Stokes presents today for a follow up visit. He is a nice man with the above diagnosis. He completed palliative radiation therapy to the spine with improvement in his overall pain in 03/2012. He is ambulate better and not using a walker on a wheelchair. He has been on Xtandi and tolerating well without complications. No chest pain, SOB, DOE. No abdominal pain, nausea, vomiting. No bleeding. No seizures. He is eating a lot better and continued improvement in his overall health. He has no new pain at this time. He is reporting more pain recently and needing more pain medications. He is reporting that Hydrocodone is not helping as much as before.   Medications: I have reviewed the patient's current medications. Current Outpatient Prescriptions  Medication Sig Dispense Refill  . HYDROcodone-acetaminophen (NORCO) 5-325 MG per tablet Take 1 tablet by mouth every 6 (six) hours as needed for pain.  30 tablet  0  . XTANDI 40 MG capsule TAKE 4 CAPSULES (160MG ) BY MOUTH ONCE   A DAY. SWALLOW CAPSULE WHOLE.DO NOT    CHEW, DISSOLVE OR OPEN CAPSULE.  120 capsule  0  . abiraterone Acetate (ZYTIGA) 250 MG tablet Take 4 tablets (1,000 mg total) by mouth daily.  Take on an empty stomach 1 hour before or 2 hours after a meal  120 tablet  0  . acetaminophen (TYLENOL) 500 MG tablet Take 500 mg by mouth every 6 (six) hours as needed for pain.      . carvedilol (COREG) 6.25 MG tablet Take 1 tablet (6.25 mg total) by mouth 2 (two) times daily with a meal.  60 tablet  1   No current facility-administered medications for this visit.    Allergies: No Known Allergies  Past Medical History, Surgical history, Social history, and Family History were reviewed and updated.  Review of Systems:  Remaining ROS negative.  Physical Exam: Blood pressure 156/86, pulse 96, temperature 98.2 F (36.8 C), temperature source Oral, resp. rate 18, height 5\' 11"  (1.803 m), weight 229 lb (103.874 kg), SpO2 100.00%. ECOG: 1 General appearance: alert Head: Normocephalic, without obvious abnormality, atraumatic Neck: no adenopathy, no carotid bruit, no JVD, supple, symmetrical, trachea midline and thyroid not enlarged, symmetric, no tenderness/mass/nodules Lymph nodes: Cervical, supraclavicular, and axillary nodes normal. Heart:regular rate and rhythm, S1, S2 normal, no murmur, click, rub or gallop Lung:chest clear, no wheezing, rales, normal symmetric air entry Abdomen: soft, non-tender, without masses or organomegaly EXT:no erythema, induration, or nodules   Lab Results: Lab Results  Component Value Date   WBC 11.8* 11/12/2012   HGB 8.3* 11/12/2012   HCT 25.9* 11/12/2012   MCV 86.8 11/12/2012   PLT 325 11/12/2012     Chemistry      Component  Value Date/Time   NA 143 11/12/2012 1434   NA 137 04/28/2012 0810   K 3.9 11/12/2012 1434   K 3.5 04/28/2012 0810   CL 107 08/28/2012 1355   CL 102 04/28/2012 0810   CO2 19* 11/12/2012 1434   CO2 22 04/28/2012 0810   BUN 29.8* 11/12/2012 1434   BUN 8 04/28/2012 0810   CREATININE 1.1 11/12/2012 1434   CREATININE 0.67 04/28/2012 0810      Component Value Date/Time   CALCIUM 9.8 11/12/2012 1434   CALCIUM 8.4 04/28/2012 0810   ALKPHOS 173*  11/12/2012 1434   ALKPHOS 349* 04/27/2012 0806   AST 13 11/12/2012 1434   AST 12 04/27/2012 0806   ALT 6 11/12/2012 1434   ALT 19 04/27/2012 0806   BILITOT 0.23 11/12/2012 1434   BILITOT 0.3 04/27/2012 0806      Results for Stephen Stokes, Stephen Stokes (MRN [id number]) as of 11/12/2012 15:03  Ref. Range 07/10/2012 14:20 10/02/2012 12:39  PSA Latest Range: <=4.00 ng/mL 102.50 (H) 401.30 (H)    Impression and Plan: This is a 57 year old gentleman with the following issues:  1. Castration-resistant prostate cancer. He was diagnosed initially in January 2012, presented with a PSA close to 1400. He is S/P bilateral orchiectomy with good response.  He developed castration resistant disease with PSA up to 500.  He was started on Xtandi and he is tolerating this well. His PSA have been rising in the last few months. I will switch him to Zytiga. Risks and benefits discussed and he is agreeable to proceed.  2. Bone pain: I have given Rx for Oxycodone instead of hydrocodone.   3. 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 No active bleeding. No transfusion indicated. This has improved.   4. Follow-up: 4 weeks.  Elissa Grieshop 9/3/20143:16 PM

## 2012-11-18 ENCOUNTER — Ambulatory Visit (HOSPITAL_COMMUNITY)
Admission: RE | Admit: 2012-11-18 | Discharge: 2012-11-18 | Disposition: A | Discharge status: Home / Self Care | Payer: Medicare Other | Source: Ambulatory Visit | Attending: Family Medicine | Admitting: Family Medicine

## 2012-11-18 ENCOUNTER — Other Ambulatory Visit (HOSPITAL_COMMUNITY): Payer: Self-pay | Admitting: Family Medicine

## 2012-11-18 DIAGNOSIS — R000 Tachycardia, unspecified: Secondary | ICD-10-CM

## 2012-11-18 DIAGNOSIS — R9431 Abnormal electrocardiogram [ECG] [EKG]: Secondary | ICD-10-CM | POA: Insufficient documentation

## 2012-11-20 ENCOUNTER — Other Ambulatory Visit: Payer: Self-pay | Admitting: Oncology

## 2012-11-20 DIAGNOSIS — C61 Malignant neoplasm of prostate: Secondary | ICD-10-CM

## 2012-11-20 DIAGNOSIS — C800 Disseminated malignant neoplasm, unspecified: Secondary | ICD-10-CM

## 2012-12-08 ENCOUNTER — Other Ambulatory Visit: Payer: Self-pay | Admitting: Oncology

## 2012-12-08 DIAGNOSIS — C61 Malignant neoplasm of prostate: Secondary | ICD-10-CM

## 2012-12-08 DIAGNOSIS — C800 Disseminated malignant neoplasm, unspecified: Secondary | ICD-10-CM

## 2012-12-10 ENCOUNTER — Telehealth: Payer: Self-pay | Admitting: Oncology

## 2012-12-10 ENCOUNTER — Ambulatory Visit (HOSPITAL_BASED_OUTPATIENT_CLINIC_OR_DEPARTMENT_OTHER): Payer: Medicaid Other | Admitting: Oncology

## 2012-12-10 ENCOUNTER — Other Ambulatory Visit (HOSPITAL_BASED_OUTPATIENT_CLINIC_OR_DEPARTMENT_OTHER): Payer: Medicaid Other | Admitting: Lab

## 2012-12-10 VITALS — BP 151/86 | HR 77 | Temp 97.9°F | Resp 19 | Ht 71.0 in | Wt 221.9 lb

## 2012-12-10 DIAGNOSIS — C61 Malignant neoplasm of prostate: Secondary | ICD-10-CM

## 2012-12-10 DIAGNOSIS — C7951 Secondary malignant neoplasm of bone: Secondary | ICD-10-CM

## 2012-12-10 DIAGNOSIS — D630 Anemia in neoplastic disease: Secondary | ICD-10-CM

## 2012-12-10 LAB — CBC WITH DIFFERENTIAL/PLATELET
Eosinophils Absolute: 0.1 10*3/uL (ref 0.0–0.5)
HCT: 25.9 % — ABNORMAL LOW (ref 38.4–49.9)
LYMPH%: 22.4 % (ref 14.0–49.0)
MCHC: 29.7 g/dL — ABNORMAL LOW (ref 32.0–36.0)
MCV: 87.2 fL (ref 79.3–98.0)
MONO#: 0.5 10*3/uL (ref 0.1–0.9)
MONO%: 6.1 % (ref 0.0–14.0)
NEUT#: 5.1 10*3/uL (ref 1.5–6.5)
NEUT%: 69.8 % (ref 39.0–75.0)
Platelets: 259 10*3/uL (ref 140–400)
RBC: 2.97 10*6/uL — ABNORMAL LOW (ref 4.20–5.82)
WBC: 7.4 10*3/uL (ref 4.0–10.3)

## 2012-12-10 LAB — COMPREHENSIVE METABOLIC PANEL (CC13)
BUN: 15 mg/dL (ref 7.0–26.0)
CO2: 24 mEq/L (ref 22–29)
Calcium: 9.4 mg/dL (ref 8.4–10.4)
Chloride: 106 mEq/L (ref 98–109)
Creatinine: 0.8 mg/dL (ref 0.7–1.3)
Total Bilirubin: 0.37 mg/dL (ref 0.20–1.20)

## 2012-12-10 LAB — PSA: PSA: 818.5 ng/mL — ABNORMAL HIGH (ref <=4.00)

## 2012-12-10 LAB — TECHNOLOGIST REVIEW

## 2012-12-10 MED ORDER — HYDROCODONE-ACETAMINOPHEN 5-325 MG PO TABS: 1.0000 | ORAL_TABLET | Freq: Four times a day (QID) | ORAL | Status: DC | PRN

## 2012-12-10 NOTE — Progress Notes (Signed)
Hematology and Oncology Follow Up Visit  Stephen Stokes [id number] 02/05/1956 57 y.o. 12/10/2012 12:05 PM   Principle Diagnosis: 57 year old with prostate cancer diagnosed in 03/2010. He presented with a PSA close to 1400 and back pain  And advanced disease. Gleason score was 4 + 3 equals 7.   Prior Therapy: He underwent a transurethral resection of the prostate procedure and bilateral orchiectomy. His Gleason score was 4 + 3 equals 7. Most recently he is developing a rise in his PSA despite castrate level of testosterone. He received Casodex as combined androgen deprivation, but stopped on his own. He Completed palliative radiation therapy for a total of 30 Gy on 03/25/2012.  Xtandi 160 mg daily started February 2014 and was stopped in 11/2012 due to progression of disease.   Current therapy: Zytiga at 1000 mg daily started last week of September 2014.  Interim History:  [name] presents today for a follow up visit. He is a nice man with the above diagnosis. He was started on Zytiga without any new complications at this time. He has tolerated very well without any reports of nausea, vomiting, abdominal pain or lower extremity edema. He reports his pain is actually improved and using less pain medication but he did run out of hydrocodone. He is ambulate better and not using a walker on a wheelchair.  No chest pain, SOB, DOE. No abdominal pain, nausea, vomiting. No bleeding. No seizures.   Medications: I have reviewed the patient's current medications. Current Outpatient Prescriptions  Medication Sig Dispense Refill  . abiraterone Acetate (ZYTIGA) 250 MG tablet Take 4 tablets (1,000 mg total) by mouth daily. Take on an empty stomach 1 hour before or 2 hours after a meal  120 tablet  0  . acetaminophen (TYLENOL) 500 MG tablet Take 500 mg by mouth every 6 (six) hours as needed for pain.      Marland Kitchen amLODipine (NORVASC) 10 MG tablet Take 10 mg by mouth daily.      Marland Kitchen aspirin 81 MG tablet Take 81 mg by  mouth daily.      . carvedilol (COREG) 6.25 MG tablet Take 1 tablet (6.25 mg total) by mouth 2 (two) times daily with a meal.  60 tablet  1  . fenofibrate (TRICOR) 48 MG tablet Take 48 mg by mouth daily.      . furosemide (LASIX) 20 MG tablet Take 20 mg by mouth.      Marland Kitchen HYDROcodone-acetaminophen (NORCO) 5-325 MG per tablet Take 1 tablet by mouth every 6 (six) hours as needed for pain.  30 tablet  0  . IRON, FERROUS GLUCONATE, PO Take 65 mg by mouth daily.      . metoprolol (LOPRESSOR) 50 MG tablet Take 50 mg by mouth 2 (two) times daily.      Marland Kitchen oxyCODONE (OXY IR/ROXICODONE) 5 MG immediate release tablet Take 1 tablet (5 mg total) by mouth every 4 (four) hours as needed for pain.  30 tablet  0  . pravastatin (PRAVACHOL) 20 MG tablet Take 20 mg by mouth daily.      Stephen Stokes 40 MG capsule TAKE 4 CAPSULES (160MG ) BY MOUTH ONCE   A DAY. SWALLOW CAPSULE WHOLE.DO NOT    CHEW, DISSOLVE OR OPEN CAPSULE.  120 capsule  0   No current facility-administered medications for this visit.    Allergies: No Known Allergies  Past Medical History, Surgical history, Social history, and Family History were reviewed and updated.  Review of Systems:  Remaining  ROS negative.  Physical Exam: Blood pressure 151/86, pulse 77, temperature 97.9 F (36.6 C), temperature source Oral, resp. rate 19, height 5\' 11"  (1.803 m), weight 221 lb 14.4 oz (100.653 kg). ECOG: 1 General appearance: alert Head: Normocephalic, without obvious abnormality, atraumatic Neck: no adenopathy, no carotid bruit, no JVD, supple, symmetrical, trachea midline and thyroid not enlarged, symmetric, no tenderness/mass/nodules Lymph nodes: Cervical, supraclavicular, and axillary nodes normal. Heart:regular rate and rhythm, S1, S2 normal, no murmur, click, rub or gallop Lung:chest clear, no wheezing, rales, normal symmetric air entry Abdomen: soft, non-tender, without masses or organomegaly EXT:no erythema, induration, or nodules   Lab  Results: Lab Results  Component Value Date   WBC 7.4 12/10/2012   HGB 7.7* 12/10/2012   HCT 25.9* 12/10/2012   MCV 87.2 12/10/2012   PLT 259 12/10/2012     Chemistry      Component Value Date/Time   NA 143 11/12/2012 1434   NA 137 04/28/2012 0810   K 3.9 11/12/2012 1434   K 3.5 04/28/2012 0810   CL 107 08/28/2012 1355   CL 102 04/28/2012 0810   CO2 19* 11/12/2012 1434   CO2 22 04/28/2012 0810   BUN 29.8* 11/12/2012 1434   BUN 8 04/28/2012 0810   CREATININE 1.1 11/12/2012 1434   CREATININE 0.67 04/28/2012 0810      Component Value Date/Time   CALCIUM 9.8 11/12/2012 1434   CALCIUM 8.4 04/28/2012 0810   ALKPHOS 173* 11/12/2012 1434   ALKPHOS 349* 04/27/2012 0806   AST 13 11/12/2012 1434   AST 12 04/27/2012 0806   ALT 6 11/12/2012 1434   ALT 19 04/27/2012 0806   BILITOT 0.23 11/12/2012 1434   BILITOT 0.3 04/27/2012 0806         Impression and Plan: This is a 57 year old gentleman with the following issues:  1. Castration-resistant prostate cancer. He was diagnosed initially in January 2012, presented with a PSA close to 1400. He is S/P bilateral orchiectomy with good response.  He developed castration resistant disease with PSA up to 500.  He was started on Xtandi and he is tolerating this well. His PSA have been rising in the last few months. I switched him to [facility] which she has tolerated very well without any complications. His PSA is pending today but have been rising recently and for that reason he was switched to this new medication.  2. Bone pain: I gave him a prescription for hydrocodone today as he felt that is a better drug for him.  3. 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 No active bleeding. No transfusion indicated.   4. Follow-up: 4- 5 weeks.  Artice Holohan 10/1/201412:05 PM

## 2012-12-10 NOTE — Telephone Encounter (Signed)
gv and printed appt sched adn avs for pt for NOV °

## 2012-12-15 ENCOUNTER — Other Ambulatory Visit: Payer: Self-pay | Admitting: *Deleted

## 2012-12-15 DIAGNOSIS — C61 Malignant neoplasm of prostate: Secondary | ICD-10-CM

## 2012-12-15 MED ORDER — ABIRATERONE ACETATE 250 MG PO TABS: 1000.0000 mg | ORAL_TABLET | Freq: Every day | ORAL | Status: DC

## 2012-12-15 NOTE — Telephone Encounter (Signed)
REFILL FOR ZYTIGA FAX SENT TO  [facility] [phone] AND PHONE IS [phone].

## 2012-12-17 ENCOUNTER — Encounter: Payer: Self-pay | Admitting: Oncology

## 2012-12-17 NOTE — Progress Notes (Signed)
Faxed zytiga refill prescription to [facility].

## 2012-12-23 ENCOUNTER — Other Ambulatory Visit: Payer: Self-pay | Admitting: Oncology

## 2012-12-26 ENCOUNTER — Encounter: Payer: Self-pay | Admitting: *Deleted

## 2012-12-26 NOTE — Progress Notes (Signed)
Faxed insurance info and icd-9 code to [facility] for patient's zytiga script

## 2013-01-12 ENCOUNTER — Other Ambulatory Visit: Payer: Self-pay | Admitting: *Deleted

## 2013-01-12 ENCOUNTER — Telehealth: Payer: Self-pay | Admitting: *Deleted

## 2013-01-12 ENCOUNTER — Encounter: Payer: Self-pay | Admitting: *Deleted

## 2013-01-12 DIAGNOSIS — C61 Malignant neoplasm of prostate: Secondary | ICD-10-CM

## 2013-01-12 MED ORDER — ABIRATERONE ACETATE 250 MG PO TABS: 1000.0000 mg | ORAL_TABLET | Freq: Every day | ORAL | Status: DC

## 2013-01-12 MED ORDER — OXYCODONE HCL 5 MG PO TABS: 5.0000 mg | ORAL_TABLET | ORAL | Status: DC | PRN

## 2013-01-12 NOTE — Telephone Encounter (Signed)
Per pt Zytiga filled at [facility]. Called s/w Maxine Glenn, Pharmacist at [facility] verified pt received Rx for [facility] in Oct. Faxed rx to [phone]. Pt will be notified by [facility] when ready for pick up. Monica advised she will call back to MD's office if there are any problems or concerns.

## 2013-01-12 NOTE — Telephone Encounter (Signed)
Pt called with request for pain medication & zytiga refill. Pt advised he uses [facility]. Reviewed with MD. Algis Downs pt to pick up Rx to take to [facility]. Pt verbalized understanding.

## 2013-01-16 ENCOUNTER — Other Ambulatory Visit: Payer: Self-pay | Admitting: *Deleted

## 2013-01-16 DIAGNOSIS — C61 Malignant neoplasm of prostate: Secondary | ICD-10-CM

## 2013-01-16 MED ORDER — ABIRATERONE ACETATE 250 MG PO TABS: 1000.0000 mg | ORAL_TABLET | Freq: Every day | ORAL | Status: DC

## 2013-01-16 NOTE — Telephone Encounter (Signed)
Zytiga rx called into [facility] pharmacy

## 2013-01-21 ENCOUNTER — Ambulatory Visit: Payer: Medicare Other | Admitting: Lab

## 2013-01-21 ENCOUNTER — Telehealth: Payer: Self-pay | Admitting: Oncology

## 2013-01-21 ENCOUNTER — Ambulatory Visit (HOSPITAL_COMMUNITY)
Admission: RE | Admit: 2013-01-21 | Discharge: 2013-01-21 | Disposition: A | Discharge status: Home / Self Care | Payer: Medicare Other | Source: Ambulatory Visit | Attending: Oncology | Admitting: Oncology

## 2013-01-21 ENCOUNTER — Other Ambulatory Visit (HOSPITAL_BASED_OUTPATIENT_CLINIC_OR_DEPARTMENT_OTHER): Payer: Medicare Other | Admitting: Lab

## 2013-01-21 ENCOUNTER — Ambulatory Visit (HOSPITAL_BASED_OUTPATIENT_CLINIC_OR_DEPARTMENT_OTHER): Payer: Medicare Other | Admitting: Oncology

## 2013-01-21 VITALS — BP 136/92 | HR 105 | Temp 97.9°F | Resp 18 | Ht 71.0 in | Wt 214.6 lb

## 2013-01-21 DIAGNOSIS — C7951 Secondary malignant neoplasm of bone: Secondary | ICD-10-CM

## 2013-01-21 DIAGNOSIS — C801 Malignant (primary) neoplasm, unspecified: Secondary | ICD-10-CM | POA: Insufficient documentation

## 2013-01-21 DIAGNOSIS — C61 Malignant neoplasm of prostate: Secondary | ICD-10-CM

## 2013-01-21 DIAGNOSIS — D6481 Anemia due to antineoplastic chemotherapy: Secondary | ICD-10-CM

## 2013-01-21 DIAGNOSIS — D649 Anemia, unspecified: Secondary | ICD-10-CM | POA: Insufficient documentation

## 2013-01-21 DIAGNOSIS — R5381 Other malaise: Secondary | ICD-10-CM

## 2013-01-21 DIAGNOSIS — M899 Disorder of bone, unspecified: Secondary | ICD-10-CM

## 2013-01-21 LAB — CBC WITH DIFFERENTIAL/PLATELET
Eosinophils Absolute: 0.1 10*3/uL (ref 0.0–0.5)
HGB: 6.9 g/dL — CL (ref 13.0–17.1)
MONO#: 0.6 10*3/uL (ref 0.1–0.9)
MONO%: 8.9 % (ref 0.0–14.0)
NEUT#: 4 10*3/uL (ref 1.5–6.5)
RBC: 2.77 10*6/uL — ABNORMAL LOW (ref 4.20–5.82)
RDW: 23 % — ABNORMAL HIGH (ref 11.0–14.6)
WBC: 6.2 10*3/uL (ref 4.0–10.3)
lymph#: 1.5 10*3/uL (ref 0.9–3.3)
nRBC: 4 % — ABNORMAL HIGH (ref 0–0)

## 2013-01-21 LAB — COMPREHENSIVE METABOLIC PANEL (CC13)
ALT: <6 U/L (ref 0–55)
Albumin: 3.8 g/dL (ref 3.5–5.0)
Anion Gap: 12 mEq/L — ABNORMAL HIGH (ref 3–11)
CO2: 22 mEq/L (ref 22–29)
Potassium: 3.7 mEq/L (ref 3.5–5.1)
Sodium: 141 mEq/L (ref 136–145)
Total Bilirubin: 0.41 mg/dL (ref 0.20–1.20)
Total Protein: 7.6 g/dL (ref 6.4–8.3)

## 2013-01-21 MED ORDER — OXYCODONE HCL 5 MG PO TABS: 5.0000 mg | ORAL_TABLET | ORAL | Status: DC | PRN

## 2013-01-21 NOTE — Telephone Encounter (Signed)
gv and printed appt sched and avs for opt for NOV and DEC °

## 2013-01-21 NOTE — Progress Notes (Signed)
Hematology and Oncology Follow Up Visit  Stephen Stokes [id number] 06/28/1955 57 y.o. 01/21/2013 1:50 PM   Principle Diagnosis: 57 year old with prostate cancer diagnosed in 03/2010. He presented with a PSA close to 1400 and back pain  And advanced disease. Gleason score was 4 + 3 equals 7.   Prior Therapy: He underwent a transurethral resection of the prostate procedure and bilateral orchiectomy. His Gleason score was 4 + 3 equals 7. Most recently he is developing a rise in his PSA despite castrate level of testosterone. He received Casodex as combined androgen deprivation, but stopped on his own. He Completed palliative radiation therapy for a total of 30 Gy on 03/25/2012.  Xtandi 160 mg daily started February 2014 and was stopped in 11/2012 due to progression of disease.   Current therapy: Zytiga at 1000 mg daily started last week of September 2014.  Interim History:  Stephen Stokes for a follow up visit. He is a nice man with the above diagnosis. He was started on Zytiga without any new complications at this time. He has tolerated very well without any reports of nausea, vomiting, abdominal pain or lower extremity edema. He reports his pain is actually improved and using less pain medication. He is ambulate better and not using a walker on a wheelchair.  No chest pain, SOB, DOE. No abdominal pain, nausea, vomiting. No bleeding. No seizures noted. He has reported more weakness and fatigue. He is not reporting any shortness of breath but his exercise tolerance have declined significantly.  Medications: I have reviewed the patient's current medications. Current Outpatient Prescriptions  Medication Sig Dispense Refill  . abiraterone Acetate (ZYTIGA) 250 MG tablet Take 4 tablets (1,000 mg total) by mouth daily. Take on an empty stomach 1 hour before or 2 hours after a meal  120 tablet  0  . acetaminophen (TYLENOL) 500 MG tablet Take 500 mg by mouth every 6 (six) hours as needed for pain.       Marland Kitchen amLODipine (NORVASC) 10 MG tablet Take 10 mg by mouth daily.      Marland Kitchen aspirin 81 MG tablet Take 81 mg by mouth daily.      . carvedilol (COREG) 6.25 MG tablet Take 1 tablet (6.25 mg total) by mouth 2 (two) times daily with a meal.  60 tablet  1  . fenofibrate (TRICOR) 48 MG tablet Take 48 mg by mouth daily.      . furosemide (LASIX) 20 MG tablet Take 20 mg by mouth.      Marland Kitchen HYDROcodone-acetaminophen (NORCO) 5-325 MG per tablet Take 1 tablet by mouth every 6 (six) hours as needed for pain.  30 tablet  0  . IRON, FERROUS GLUCONATE, PO Take 65 mg by mouth daily.      . metoprolol (LOPRESSOR) 50 MG tablet Take 50 mg by mouth 2 (two) times daily.      Marland Kitchen oxyCODONE (OXY IR/ROXICODONE) 5 MG immediate release tablet Take 1 tablet (5 mg total) by mouth every 4 (four) hours as needed.  30 tablet  0  . pravastatin (PRAVACHOL) 20 MG tablet Take 20 mg by mouth daily.      [name] 40 MG capsule TAKE 4 CAPSULES (160MG ) BY MOUTH ONCE   A DAY. SWALLOW CAPSULE WHOLE.DO NOT    CHEW, DISSOLVE OR OPEN CAPSULE.  120 capsule  0   No current facility-administered medications for this visit.    Allergies: No Known Allergies  Past Medical History, Surgical history, Social history,  and Family History were reviewed and updated.  Review of Systems:  Remaining ROS negative.  Physical Exam: Blood pressure 136/92, pulse 105, temperature 97.9 F (36.6 C), temperature source Oral, resp. rate 18, height 5\' 11"  (1.803 m), weight 214 lb 9.6 oz (97.342 kg). ECOG: 1 General appearance: alert Head: Normocephalic, without obvious abnormality, atraumatic Neck: no adenopathy, no carotid bruit, no JVD, supple, symmetrical, trachea midline and thyroid not enlarged, symmetric, no tenderness/mass/nodules Lymph nodes: Cervical, supraclavicular, and axillary nodes normal. Heart:regular rate and rhythm, S1, S2 normal, no murmur, click, rub or gallop Lung:chest clear, no wheezing, rales, normal symmetric air entry Abdomen: soft,  non-tender, without masses or organomegaly EXT:no erythema, induration, or nodules   Lab Results: Lab Results  Component Value Date   WBC 6.2 01/21/2013   HGB 6.9* 01/21/2013   HCT 23.5* 01/21/2013   MCV 84.8 01/21/2013   PLT 183 01/21/2013     Chemistry      Component Value Date/Time   NA 143 12/10/2012 1129   NA 137 04/28/2012 0810   K 3.6 12/10/2012 1129   K 3.5 04/28/2012 0810   CL 107 08/28/2012 1355   CL 102 04/28/2012 0810   CO2 24 12/10/2012 1129   CO2 22 04/28/2012 0810   BUN 15.0 12/10/2012 1129   BUN 8 04/28/2012 0810   CREATININE 0.8 12/10/2012 1129   CREATININE 0.67 04/28/2012 0810      Component Value Date/Time   CALCIUM 9.4 12/10/2012 1129   CALCIUM 8.4 04/28/2012 0810   ALKPHOS 207* 12/10/2012 1129   ALKPHOS 349* 04/27/2012 0806   AST 12 12/10/2012 1129   AST 12 04/27/2012 0806   ALT 7 12/10/2012 1129   ALT 19 04/27/2012 0806   BILITOT 0.37 12/10/2012 1129   BILITOT 0.3 04/27/2012 0806      Results for Stephen Stokes (MRN [id number]) as of 01/21/2013 13:34  Ref. Range 11/12/2012 14:34 12/10/2012 11:29  PSA Latest Range: <=4.00 ng/mL 744.40 (H) 818.50 (H)     Impression and Plan: This is a 57 year old gentleman with the following issues:  1. Castration-resistant prostate cancer. He was diagnosed initially in January 2012, presented with a PSA close to 1400. He is S/P bilateral orchiectomy with good response.  He developed castration resistant disease with PSA up to 500.  He has progressed on [place] and currently on Zytiga. He have tolerated it well in the last month and we'll see what his PSA is Stokes.  2. Bone pain: I gave him a prescription for oxycodone Stokes.  3. 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 No active bleeding. He is more symptomatic at this point I will set him up for packed red cells transfusion.  4. Follow-up: 4- 5 weeks.  Stephen Stokes 11/12/20141:50 PM

## 2013-01-22 ENCOUNTER — Ambulatory Visit (HOSPITAL_BASED_OUTPATIENT_CLINIC_OR_DEPARTMENT_OTHER): Payer: Medicare Other

## 2013-01-22 VITALS — BP 135/84 | HR 89 | Temp 97.4°F | Resp 20

## 2013-01-22 DIAGNOSIS — D6481 Anemia due to antineoplastic chemotherapy: Secondary | ICD-10-CM

## 2013-01-22 DIAGNOSIS — T451X5A Adverse effect of antineoplastic and immunosuppressive drugs, initial encounter: Secondary | ICD-10-CM

## 2013-01-22 DIAGNOSIS — C61 Malignant neoplasm of prostate: Secondary | ICD-10-CM

## 2013-01-22 LAB — PREPARE RBC (CROSSMATCH)

## 2013-01-22 MED ORDER — ACETAMINOPHEN 325 MG PO TABS
650.0000 mg | ORAL_TABLET | Freq: Once | ORAL | Status: AC
  Administered 2013-01-22: 650 mg via ORAL

## 2013-01-22 MED ORDER — SODIUM CHLORIDE 0.9 % IV SOLN
250.0000 mL | Freq: Once | INTRAVENOUS | Status: AC
  Administered 2013-01-22: 250 mL via INTRAVENOUS

## 2013-01-22 MED ORDER — OXYCODONE-ACETAMINOPHEN 5-325 MG PO TABS
1.0000 | ORAL_TABLET | Freq: Once | ORAL | Status: AC
  Administered 2013-01-22: 1 via ORAL

## 2013-01-22 MED ORDER — DIPHENHYDRAMINE HCL 25 MG PO CAPS
25.0000 mg | ORAL_CAPSULE | Freq: Once | ORAL | Status: AC
  Administered 2013-01-22: 25 mg via ORAL

## 2013-01-22 MED ORDER — OXYCODONE-ACETAMINOPHEN 5-325 MG PO TABS
ORAL_TABLET | ORAL | Status: AC
  Filled 2013-01-22: qty 1

## 2013-01-22 MED ORDER — DIPHENHYDRAMINE HCL 25 MG PO CAPS
ORAL_CAPSULE | ORAL | Status: AC
  Filled 2013-01-22: qty 1

## 2013-01-22 NOTE — Progress Notes (Signed)
1220 -  Pt complained of pain in left leg up to the left hip- rated 6/10. Pt stated he did not take pain meds this am at home.  Dr. Clelia Croft notified.  Order received for 1 tablet  Percocet to be given. 1320  -  Pt stated pain almost gone.  Will continue to monitor. 1345  -  Pt stated complete pain relief.

## 2013-01-22 NOTE — Patient Instructions (Signed)
Blood Transfusion Information WHAT IS A BLOOD TRANSFUSION? A transfusion is the replacement of blood or some of its parts. Blood is made up of multiple cells which provide different functions.  Red blood cells carry oxygen and are used for blood loss replacement.  White blood cells fight against infection.  Platelets control bleeding.  Plasma helps clot blood.  Other blood products are available for specialized needs, such as hemophilia or other clotting disorders. BEFORE THE TRANSFUSION  Who gives blood for transfusions?   You may be able to donate blood to be used at a later date on yourself (autologous donation).  Relatives can be asked to donate blood. This is generally not any safer than if you have received blood from a stranger. The same precautions are taken to ensure safety when a relative's blood is donated.  Healthy volunteers who are fully evaluated to make sure their blood is safe. This is blood bank blood. Transfusion therapy is the safest it has ever been in the practice of medicine. Before blood is taken from a donor, a complete history is taken to make sure that person has no history of diseases nor engages in risky social behavior (examples are intravenous drug use or sexual activity with multiple partners). The donor's travel history is screened to minimize risk of transmitting infections, such as malaria. The donated blood is tested for signs of infectious diseases, such as HIV and hepatitis. The blood is then tested to be sure it is compatible with you in order to minimize the chance of a transfusion reaction. If you or a relative donates blood, this is often done in anticipation of surgery and is not appropriate for emergency situations. It takes many days to process the donated blood. RISKS AND COMPLICATIONS Although transfusion therapy is very safe and saves many lives, the main dangers of transfusion include:   Getting an infectious disease.  Developing a  transfusion reaction. This is an allergic reaction to something in the blood you were given. Every precaution is taken to prevent this. The decision to have a blood transfusion has been considered carefully by your caregiver before blood is given. Blood is not given unless the benefits outweigh the risks. AFTER THE TRANSFUSION  Right after receiving a blood transfusion, you will usually feel much better and more energetic. This is especially true if your red blood cells have gotten low (anemic). The transfusion raises the level of the red blood cells which carry oxygen, and this usually causes an energy increase.  The nurse administering the transfusion will monitor you carefully for complications. HOME CARE INSTRUCTIONS  No special instructions are needed after a transfusion. You may find your energy is better. Speak with your caregiver about any limitations on activity for underlying diseases you may have. SEEK MEDICAL CARE IF:   Your condition is not improving after your transfusion.  You develop redness or irritation at the intravenous (IV) site. SEEK IMMEDIATE MEDICAL CARE IF:  Any of the following symptoms occur over the next 12 hours:  Shaking chills.  You have a temperature by mouth above 102 F (38.9 C), not controlled by medicine.  Chest, back, or muscle pain.  People around you feel you are not acting correctly or are confused.  Shortness of breath or difficulty breathing.  Dizziness and fainting.  You get a rash or develop hives.  You have a decrease in urine output.  Your urine turns a dark color or changes to pink, red, or brown. Any of the following   symptoms occur over the next 10 days:  You have a temperature by mouth above 102 F (38.9 C), not controlled by medicine.  Shortness of breath.  Weakness after normal activity.  The white part of the eye turns yellow (jaundice).  You have a decrease in the amount of urine or are urinating less often.  Your  urine turns a dark color or changes to pink, red, or brown. Document Released: 02/24/2000 Document Revised: 05/21/2011 Document Reviewed: 10/13/2007 Exit[employer] Patient Information 2014 [employer].  

## 2013-01-23 LAB — TYPE AND SCREEN
ABO/RH(D): A POS
Antibody Screen: NEGATIVE
Unit division: 0

## 2013-02-13 ENCOUNTER — Other Ambulatory Visit: Payer: Self-pay | Admitting: *Deleted

## 2013-02-13 MED ORDER — ABIRATERONE ACETATE 250 MG PO TABS: 1000.0000 mg | ORAL_TABLET | Freq: Every day | ORAL | Status: DC

## 2013-02-27 ENCOUNTER — Ambulatory Visit: Payer: Medicare Other

## 2013-02-27 ENCOUNTER — Ambulatory Visit (HOSPITAL_COMMUNITY)
Admission: RE | Admit: 2013-02-27 | Discharge: 2013-02-27 | Disposition: A | Discharge status: Home / Self Care | Payer: Medicare Other | Source: Ambulatory Visit | Attending: Oncology | Admitting: Oncology

## 2013-02-27 ENCOUNTER — Encounter: Payer: Self-pay | Admitting: Oncology

## 2013-02-27 ENCOUNTER — Telehealth: Payer: Self-pay | Admitting: *Deleted

## 2013-02-27 ENCOUNTER — Other Ambulatory Visit (HOSPITAL_BASED_OUTPATIENT_CLINIC_OR_DEPARTMENT_OTHER): Payer: Medicare Other

## 2013-02-27 ENCOUNTER — Ambulatory Visit (HOSPITAL_BASED_OUTPATIENT_CLINIC_OR_DEPARTMENT_OTHER): Payer: Medicare Other | Admitting: Oncology

## 2013-02-27 ENCOUNTER — Telehealth: Payer: Self-pay | Admitting: Oncology

## 2013-02-27 VITALS — BP 141/89 | HR 105 | Temp 98.8°F | Resp 18 | Ht 71.0 in | Wt 203.4 lb

## 2013-02-27 DIAGNOSIS — C61 Malignant neoplasm of prostate: Secondary | ICD-10-CM | POA: Insufficient documentation

## 2013-02-27 DIAGNOSIS — C801 Malignant (primary) neoplasm, unspecified: Secondary | ICD-10-CM | POA: Insufficient documentation

## 2013-02-27 DIAGNOSIS — C7951 Secondary malignant neoplasm of bone: Secondary | ICD-10-CM

## 2013-02-27 DIAGNOSIS — D638 Anemia in other chronic diseases classified elsewhere: Secondary | ICD-10-CM | POA: Insufficient documentation

## 2013-02-27 DIAGNOSIS — E876 Hypokalemia: Secondary | ICD-10-CM

## 2013-02-27 DIAGNOSIS — D630 Anemia in neoplastic disease: Secondary | ICD-10-CM

## 2013-02-27 DIAGNOSIS — D649 Anemia, unspecified: Secondary | ICD-10-CM | POA: Insufficient documentation

## 2013-02-27 LAB — CBC WITH DIFFERENTIAL/PLATELET
BASO%: 0.5 % (ref 0.0–2.0)
EOS%: 1.7 % (ref 0.0–7.0)
HCT: 20.1 % — ABNORMAL LOW (ref 38.4–49.9)
MCH: 24 pg — ABNORMAL LOW (ref 27.2–33.4)
MCHC: 28.9 g/dL — ABNORMAL LOW (ref 32.0–36.0)
MONO#: 0.6 10*3/uL (ref 0.1–0.9)
NEUT#: 3.6 10*3/uL (ref 1.5–6.5)
Platelets: 190 10*3/uL (ref 140–400)
RDW: 21.1 % — ABNORMAL HIGH (ref 11.0–14.6)
WBC: 5.7 10*3/uL (ref 4.0–10.3)
lymph#: 1.5 10*3/uL (ref 0.9–3.3)
nRBC: 1 % — ABNORMAL HIGH (ref 0–0)

## 2013-02-27 LAB — COMPREHENSIVE METABOLIC PANEL (CC13)
ALT: <6 U/L (ref 0–55)
AST: 14 U/L (ref 5–34)
Albumin: 3.5 g/dL (ref 3.5–5.0)
Alkaline Phosphatase: 242 U/L — ABNORMAL HIGH (ref 40–150)
BUN: 10.3 mg/dL (ref 7.0–26.0)
Potassium: 3 mEq/L — CL (ref 3.5–5.1)
Sodium: 143 mEq/L (ref 136–145)
Total Bilirubin: 0.59 mg/dL (ref 0.20–1.20)
Total Protein: 7.3 g/dL (ref 6.4–8.3)

## 2013-02-27 LAB — PSA: PSA: 1033 ng/mL — ABNORMAL HIGH (ref <=4.00)

## 2013-02-27 LAB — HOLD TUBE, BLOOD BANK

## 2013-02-27 MED ORDER — POTASSIUM CHLORIDE CRYS ER 20 MEQ PO TBCR: 20.0000 meq | EXTENDED_RELEASE_TABLET | Freq: Every day | ORAL | Status: DC

## 2013-02-27 MED ORDER — HYDROMORPHONE HCL 4 MG PO TABS: 4.0000 mg | ORAL_TABLET | ORAL | Status: DC | PRN

## 2013-02-27 NOTE — Telephone Encounter (Signed)
Per staff message and POF I have scheduled appts. Scheduler notifed to move labs  JMW

## 2013-02-27 NOTE — Progress Notes (Signed)
Hematology and Oncology Follow Up Visit  Stephen Stokes [id number] 09/02/1955 57 y.o. 02/27/2013 4:15 PM   Principle Diagnosis: 57 year old with prostate cancer diagnosed in 03/2010. He presented with a PSA close to 1400 and back pain  And advanced disease. Gleason score was 4 + 3 equals 7.   Prior Therapy: He underwent a transurethral resection of the prostate procedure and bilateral orchiectomy. His Gleason score was 4 + 3 equals 7. Most recently he is developing a rise in his PSA despite castrate level of testosterone. He received Casodex as combined androgen deprivation, but stopped on his own. He Completed palliative radiation therapy for a total of 30 Gy on 03/25/2012.  Xtandi 160 mg daily started February 2014 and was stopped in 11/2012 due to progression of disease.   Current therapy: Zytiga at 1000 mg daily started last week of September 2014.  Interim History:  [name] presents today for a follow up visit. He is a nice man with the above diagnosis. He was started on Zytiga without any new complications related to the medication at this time. He has tolerated very well without any reports of nausea, vomiting, abdominal pain or lower extremity edema.  He reports more pain to his left hip which radiated down his leg. Oxycodone is not helping much. No chest pain or SOB. Reports DOE. No abdominal pain, nausea, vomiting. No bleeding. No seizures noted. He has reported more weakness and fatigue.   Medications: I have reviewed the patient's current medications. Current Outpatient Prescriptions  Medication Sig Dispense Refill  . abiraterone Acetate (ZYTIGA) 250 MG tablet Take 4 tablets (1,000 mg total) by mouth daily. Take on an empty stomach 1 hour before or 2 hours after a meal  120 tablet  0  . HYDROmorphone (DILAUDID) 4 MG tablet Take 1 tablet (4 mg total) by mouth every 4 (four) hours as needed for severe pain.  60 tablet  0  . potassium chloride SA (K-DUR,KLOR-CON) 20 MEQ tablet Take 1  tablet (20 mEq total) by mouth daily.  30 tablet  1   No current facility-administered medications for this visit.    Allergies: No Known Allergies  Past Medical History, Surgical history, Social history, and Family History were reviewed and updated.  Review of Systems:  Remaining ROS negative.  Physical Exam: Blood pressure 141/89, pulse 105, temperature 98.8 F (37.1 C), temperature source Oral, resp. rate 18, height 5\' 11"  (1.803 m), weight 203 lb 6.4 oz (92.262 kg). ECOG: 1 General appearance: alert Head: Normocephalic, without obvious abnormality, atraumatic Neck: no adenopathy, no carotid bruit, no JVD, supple, symmetrical, trachea midline and thyroid not enlarged, symmetric, no tenderness/mass/nodules Lymph nodes: Cervical, supraclavicular, and axillary nodes normal. Heart:regular rate and rhythm, S1, S2 normal, no murmur, click, rub or gallop Lung:chest clear, no wheezing, rales, normal symmetric air entry Abdomen: soft, non-tender, without masses or organomegaly EXT:no erythema, induration, or nodules   Lab Results: Lab Results  Component Value Date   WBC 5.7 02/27/2013   HGB 5.8* 02/27/2013   HCT 20.1* 02/27/2013   MCV 83.1 02/27/2013   PLT 190 02/27/2013     Chemistry      Component Value Date/Time   NA 143 02/27/2013 1404   NA 137 04/28/2012 0810   K 3.0* 02/27/2013 1404   K 3.5 04/28/2012 0810   CL 107 08/28/2012 1355   CL 102 04/28/2012 0810   CO2 23 02/27/2013 1404   CO2 22 04/28/2012 0810   BUN 10.3 02/27/2013 1404  BUN 8 04/28/2012 0810   CREATININE 0.8 02/27/2013 1404   CREATININE 0.67 04/28/2012 0810      Component Value Date/Time   CALCIUM 9.2 02/27/2013 1404   CALCIUM 8.4 04/28/2012 0810   ALKPHOS 242* 02/27/2013 1404   ALKPHOS 349* 04/27/2012 0806   AST 14 02/27/2013 1404   AST 12 04/27/2012 0806   ALT <6 02/27/2013 1404   ALT 19 04/27/2012 0806   BILITOT 0.59 02/27/2013 1404   BILITOT 0.3 04/27/2012 0806     Results for Stephen, Stokes (MRN  [id number]) as of 02/27/2013 16:17  Ref. Range 07/10/2012 14:20 10/02/2012 12:39 11/12/2012 14:34 12/10/2012 11:29 01/21/2013 12:59  PSA Latest Range: <=4.00 ng/mL 102.50 (H) 401.30 (H) 744.40 (H) 818.50 (H) 1155.00 (H)    Impression and Plan: This is a 57 year old gentleman with the following issues:  1. Castration-resistant prostate cancer. He was diagnosed initially in January 2012, presented with a PSA close to 1400. He is S/P bilateral orchiectomy with good response.  He developed castration resistant disease with PSA up to 500.  He has progressed on [place] and currently on Zytiga. Most recent PSA was 1155. Plan is to continue Zytiga for now. PSA is pending today.  2. Bone pain: Oxycodone is not helping much. I have given him a prescription for Dilaudid. Referral made to Radiation Oncology for possible palliative XRT or Xofigo.   3. 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 No active bleeding. He is more symptomatic at this point I will set him up for packed red cells transfusion tomorrow. Recheck CBC with possible transfusion in 2 weeks.  4. Hypokalemia: Prescription for KDur 20 Meq daily given.   5. Follow-up: 4 weeks.  Stephen Stokes 12/19/20144:15 PM    Patient is seen and examined today. He is reporting more back and predominantly left-sided hip pain. He still ambulates without any major difficulty. He has not reported any neurological symptoms or loss of bowel or bladder function. He is generally fatigued and has not reported any complications at this time.  Physical examination: Alert and oriented gentleman chronically ill-appearing did not appear in any distress. His heart and lung examination did not have any abnormalities. His neurological examination was nonfocal without any deficits. He is able to ambulate without any difficulty.  Laboratory data were reviewed which showed a profound anemia and continue to have elevated PSA of around 1100.  The plan would be at this point  is to transfuse him and I will refer him to radiation oncology for possible evaluation for external beam radiation or radiopharmaceutical. I will repeat his laboratory tests in about 2 weeks.  [facility] 02/27/2013

## 2013-02-27 NOTE — Telephone Encounter (Signed)
gave pt appt for labs ,MD and RAdiation, emailed [facility] regarding PRBC

## 2013-02-27 NOTE — Patient Instructions (Signed)
You need a blood transfusion tomorrow (12/20). Stop at lab before leaving today for more bloodwork. You will return in 2 weeks for labs and possible transfusion. You will have a visit with [place] in about 1 month.  We are referring you to Radiation Oncology to discuss further treatment options.

## 2013-02-28 ENCOUNTER — Ambulatory Visit (HOSPITAL_BASED_OUTPATIENT_CLINIC_OR_DEPARTMENT_OTHER): Payer: Medicare Other

## 2013-02-28 VITALS — BP 128/83 | HR 92 | Temp 98.7°F | Resp 20

## 2013-02-28 DIAGNOSIS — D649 Anemia, unspecified: Secondary | ICD-10-CM

## 2013-02-28 MED ORDER — SODIUM CHLORIDE 0.9 % IV SOLN
250.0000 mL | Freq: Once | INTRAVENOUS | Status: AC
  Administered 2013-02-28: 250 mL via INTRAVENOUS

## 2013-02-28 MED ORDER — ACETAMINOPHEN 325 MG PO TABS
650.0000 mg | ORAL_TABLET | Freq: Once | ORAL | Status: AC
  Administered 2013-02-28: 650 mg via ORAL

## 2013-02-28 MED ORDER — DIPHENHYDRAMINE HCL 25 MG PO CAPS
ORAL_CAPSULE | ORAL | Status: AC
  Filled 2013-02-28: qty 1

## 2013-02-28 MED ORDER — ACETAMINOPHEN 325 MG PO TABS
ORAL_TABLET | ORAL | Status: AC
  Filled 2013-02-28: qty 2

## 2013-02-28 MED ORDER — DIPHENHYDRAMINE HCL 25 MG PO CAPS
25.0000 mg | ORAL_CAPSULE | Freq: Once | ORAL | Status: AC
  Administered 2013-02-28: 25 mg via ORAL

## 2013-02-28 NOTE — Patient Instructions (Signed)
Blood Transfusion Information WHAT IS A BLOOD TRANSFUSION? A transfusion is the replacement of blood or some of its parts. Blood is made up of multiple cells which provide different functions.  Red blood cells carry oxygen and are used for blood loss replacement.  White blood cells fight against infection.  Platelets control bleeding.  Plasma helps clot blood.  Other blood products are available for specialized needs, such as hemophilia or other clotting disorders. BEFORE THE TRANSFUSION  Who gives blood for transfusions?   You may be able to donate blood to be used at a later date on yourself (autologous donation).  Relatives can be asked to donate blood. This is generally not any safer than if you have received blood from a stranger. The same precautions are taken to ensure safety when a relative's blood is donated.  Healthy volunteers who are fully evaluated to make sure their blood is safe. This is blood bank blood. Transfusion therapy is the safest it has ever been in the practice of medicine. Before blood is taken from a donor, a complete history is taken to make sure that person has no history of diseases nor engages in risky social behavior (examples are intravenous drug use or sexual activity with multiple partners). The donor's travel history is screened to minimize risk of transmitting infections, such as malaria. The donated blood is tested for signs of infectious diseases, such as HIV and hepatitis. The blood is then tested to be sure it is compatible with you in order to minimize the chance of a transfusion reaction. If you or a relative donates blood, this is often done in anticipation of surgery and is not appropriate for emergency situations. It takes many days to process the donated blood. RISKS AND COMPLICATIONS Although transfusion therapy is very safe and saves many lives, the main dangers of transfusion include:   Getting an infectious disease.  Developing a  transfusion reaction. This is an allergic reaction to something in the blood you were given. Every precaution is taken to prevent this. The decision to have a blood transfusion has been considered carefully by your caregiver before blood is given. Blood is not given unless the benefits outweigh the risks. AFTER THE TRANSFUSION  Right after receiving a blood transfusion, you will usually feel much better and more energetic. This is especially true if your red blood cells have gotten low (anemic). The transfusion raises the level of the red blood cells which carry oxygen, and this usually causes an energy increase.  The nurse administering the transfusion will monitor you carefully for complications. HOME CARE INSTRUCTIONS  No special instructions are needed after a transfusion. You may find your energy is better. Speak with your caregiver about any limitations on activity for underlying diseases you may have. SEEK MEDICAL CARE IF:   Your condition is not improving after your transfusion.  You develop redness or irritation at the intravenous (IV) site. SEEK IMMEDIATE MEDICAL CARE IF:  Any of the following symptoms occur over the next 12 hours:  Shaking chills.  You have a temperature by mouth above 102 F (38.9 C), not controlled by medicine.  Chest, back, or muscle pain.  People around you feel you are not acting correctly or are confused.  Shortness of breath or difficulty breathing.  Dizziness and fainting.  You get a rash or develop hives.  You have a decrease in urine output.  Your urine turns a dark color or changes to pink, red, or brown. Any of the following   symptoms occur over the next 10 days:  You have a temperature by mouth above 102 F (38.9 C), not controlled by medicine.  Shortness of breath.  Weakness after normal activity.  The white part of the eye turns yellow (jaundice).  You have a decrease in the amount of urine or are urinating less often.  Your  urine turns a dark color or changes to pink, red, or brown. Document Released: 02/24/2000 Document Revised: 05/21/2011 Document Reviewed: 10/13/2007 Exit[employer] Patient Information 2014 [employer].  

## 2013-03-01 LAB — TYPE AND SCREEN
ABO/RH(D): A POS
Antibody Screen: NEGATIVE
Unit division: 0

## 2013-03-04 ENCOUNTER — Telehealth: Payer: Self-pay | Admitting: Oncology

## 2013-03-04 NOTE — Telephone Encounter (Signed)
Talked to pt and gave him appt for January 2015 lab,MD and chemo

## 2013-03-09 NOTE — Progress Notes (Signed)
Histology and Location of Primary Cancer Metastatic prostate to bone, Dx  January 2012  Sites of Visceral and Bony Metastatic Disease: T-10-S3  Location(s) of Symptomatic Metastases: more pain left hip radiating down leg   Past/Anticipated chemotherapy by medical oncology, if any:He received Casodex as combined androgen deprivation, but stopped on his own.   Xtandi 160 mg daily started February 2014 and was stopped in 11/2012 due to progression of disease.   current therapy Zytiga at 1000mg  daily started last week September 2014  Pain on a scale of 0-10 is:  8 on 1-10 scale, left hip to ankle, needs refill on dilaudid Follow up appt with  Dr.Shadad 02/27/13 with increasing PSA=1033  And profound anemia ,2 units PRBC;s1/5/15 Appt with Dr.Shadad 04/08/13,   If Spine Met(s), symptoms, if any, include:  Bowel/Bladder retention or incontinence no  Numbness or weakness in extremities  Yes , Increased weakness and fatigue   Current Decadron regimen, if applicable: no  Ambulatory status? Walker? Wheelchair?:   SAFETY ISSUES: yes  Prior radiation? Yes  03/11/12-03/25/12 T-spine through mid sacral region (T10-S3),30Gy/10 fxs  Pacemaker/ICD? no  Is the patient on methotrexate? no  Current Complaints / other details:  For possible palliative radiation XRT or Xofigo per Dr.Shadad note 02/27/13

## 2013-03-10 ENCOUNTER — Other Ambulatory Visit: Payer: Self-pay | Admitting: Oncology

## 2013-03-11 ENCOUNTER — Ambulatory Visit
Admission: RE | Admit: 2013-03-11 | Discharge: 2013-03-11 | Disposition: A | Discharge status: Home / Self Care | Payer: Medicare Other | Source: Ambulatory Visit | Attending: Radiation Oncology | Admitting: Radiation Oncology

## 2013-03-11 ENCOUNTER — Ambulatory Visit: Payer: Medicare Other

## 2013-03-13 ENCOUNTER — Other Ambulatory Visit: Payer: Self-pay | Admitting: Oncology

## 2013-03-13 ENCOUNTER — Ambulatory Visit (HOSPITAL_BASED_OUTPATIENT_CLINIC_OR_DEPARTMENT_OTHER): Payer: Medicare Other

## 2013-03-13 ENCOUNTER — Ambulatory Visit (HOSPITAL_COMMUNITY)
Admission: RE | Admit: 2013-03-13 | Discharge: 2013-03-13 | Disposition: A | Discharge status: Home / Self Care | Payer: Medicare Other | Source: Ambulatory Visit | Attending: Oncology | Admitting: Oncology

## 2013-03-13 ENCOUNTER — Other Ambulatory Visit (HOSPITAL_BASED_OUTPATIENT_CLINIC_OR_DEPARTMENT_OTHER): Payer: Medicare Other

## 2013-03-13 VITALS — BP 136/79 | HR 86 | Temp 98.8°F | Resp 20

## 2013-03-13 DIAGNOSIS — D539 Nutritional anemia, unspecified: Secondary | ICD-10-CM

## 2013-03-13 DIAGNOSIS — D630 Anemia in neoplastic disease: Secondary | ICD-10-CM

## 2013-03-13 DIAGNOSIS — D649 Anemia, unspecified: Secondary | ICD-10-CM

## 2013-03-13 LAB — CBC WITH DIFFERENTIAL/PLATELET
BASO%: 0.4 % (ref 0.0–2.0)
BASOS ABS: 0 10*3/uL (ref 0.0–0.1)
EOS ABS: 0.1 10*3/uL (ref 0.0–0.5)
EOS%: 2.2 % (ref 0.0–7.0)
HEMATOCRIT: 18.1 % — AB (ref 38.4–49.9)
HEMOGLOBIN: 5.9 g/dL — AB (ref 13.0–17.1)
LYMPH%: 17.4 % (ref 14.0–49.0)
MCH: 26.2 pg — ABNORMAL LOW (ref 27.2–33.4)
MCHC: 32.4 g/dL (ref 32.0–36.0)
MCV: 81 fL (ref 79.3–98.0)
MONO#: 0.5 10*3/uL (ref 0.1–0.9)
MONO%: 7.7 % (ref 0.0–14.0)
NEUT%: 72.3 % (ref 39.0–75.0)
NEUTROS ABS: 4.6 10*3/uL (ref 1.5–6.5)
PLATELETS: 198 10*3/uL (ref 140–400)
RBC: 2.24 10*6/uL — AB (ref 4.20–5.82)
RDW: 20.5 % — ABNORMAL HIGH (ref 11.0–14.6)
WBC: 6.4 10*3/uL (ref 4.0–10.3)
lymph#: 1.1 10*3/uL (ref 0.9–3.3)

## 2013-03-13 LAB — PREPARE RBC (CROSSMATCH)

## 2013-03-13 LAB — HOLD TUBE, BLOOD BANK

## 2013-03-13 MED ORDER — SODIUM CHLORIDE 0.9 % IV SOLN
250.0000 mL | Freq: Once | INTRAVENOUS | Status: AC
  Administered 2013-03-13: 250 mL via INTRAVENOUS

## 2013-03-13 MED ORDER — DIPHENHYDRAMINE HCL 25 MG PO CAPS
ORAL_CAPSULE | ORAL | Status: AC
  Filled 2013-03-13: qty 1

## 2013-03-13 MED ORDER — ACETAMINOPHEN 325 MG PO TABS
650.0000 mg | ORAL_TABLET | Freq: Once | ORAL | Status: AC
Start: 2013-03-13 — End: 2013-03-13
  Administered 2013-03-13: 650 mg via ORAL

## 2013-03-13 MED ORDER — ACETAMINOPHEN 325 MG PO TABS
ORAL_TABLET | ORAL | Status: AC
  Filled 2013-03-13: qty 2

## 2013-03-13 MED ORDER — DIPHENHYDRAMINE HCL 25 MG PO CAPS
25.0000 mg | ORAL_CAPSULE | Freq: Once | ORAL | Status: AC
  Administered 2013-03-13: 25 mg via ORAL

## 2013-03-13 NOTE — Patient Instructions (Signed)
Blood Transfusion Information WHAT IS A BLOOD TRANSFUSION? A transfusion is the replacement of blood or some of its parts. Blood is made up of multiple cells which provide different functions.  Red blood cells carry oxygen and are used for blood loss replacement.  White blood cells fight against infection.  Platelets control bleeding.  Plasma helps clot blood.  Other blood products are available for specialized needs, such as hemophilia or other clotting disorders. BEFORE THE TRANSFUSION  Who gives blood for transfusions?   You may be able to donate blood to be used at a later date on yourself (autologous donation).  Relatives can be asked to donate blood. This is generally not any safer than if you have received blood from a stranger. The same precautions are taken to ensure safety when a relative's blood is donated.  Healthy volunteers who are fully evaluated to make sure their blood is safe. This is blood bank blood. Transfusion therapy is the safest it has ever been in the practice of medicine. Before blood is taken from a donor, a complete history is taken to make sure that person has no history of diseases nor engages in risky social behavior (examples are intravenous drug use or sexual activity with multiple partners). The donor's travel history is screened to minimize risk of transmitting infections, such as malaria. The donated blood is tested for signs of infectious diseases, such as HIV and hepatitis. The blood is then tested to be sure it is compatible with you in order to minimize the chance of a transfusion reaction. If you or a relative donates blood, this is often done in anticipation of surgery and is not appropriate for emergency situations. It takes many days to process the donated blood. RISKS AND COMPLICATIONS Although transfusion therapy is very safe and saves many lives, the main dangers of transfusion include:   Getting an infectious disease.  Developing a  transfusion reaction. This is an allergic reaction to something in the blood you were given. Every precaution is taken to prevent this. The decision to have a blood transfusion has been considered carefully by your caregiver before blood is given. Blood is not given unless the benefits outweigh the risks. AFTER THE TRANSFUSION  Right after receiving a blood transfusion, you will usually feel much better and more energetic. This is especially true if your red blood cells have gotten low (anemic). The transfusion raises the level of the red blood cells which carry oxygen, and this usually causes an energy increase.  The nurse administering the transfusion will monitor you carefully for complications. HOME CARE INSTRUCTIONS  No special instructions are needed after a transfusion. You may find your energy is better. Speak with your caregiver about any limitations on activity for underlying diseases you may have. SEEK MEDICAL CARE IF:   Your condition is not improving after your transfusion.  You develop redness or irritation at the intravenous (IV) site. SEEK IMMEDIATE MEDICAL CARE IF:  Any of the following symptoms occur over the next 12 hours:  Shaking chills.  You have a temperature by mouth above 102 F (38.9 C), not controlled by medicine.  Chest, back, or muscle pain.  People around you feel you are not acting correctly or are confused.  Shortness of breath or difficulty breathing.  Dizziness and fainting.  You get a rash or develop hives.  You have a decrease in urine output.  Your urine turns a dark color or changes to pink, red, or brown. Any of the following   symptoms occur over the next 10 days:  You have a temperature by mouth above 102 F (38.9 C), not controlled by medicine.  Shortness of breath.  Weakness after normal activity.  The white part of the eye turns yellow (jaundice).  You have a decrease in the amount of urine or are urinating less often.  Your  urine turns a dark color or changes to pink, red, or brown. Document Released: 02/24/2000 Document Revised: 05/21/2011 Document Reviewed: 10/13/2007 Exit[employer] Patient Information 2014 [employer].  

## 2013-03-14 LAB — TYPE AND SCREEN
ABO/RH(D): A POS
Antibody Screen: POSITIVE
DAT, IgG: POSITIVE
Unit division: 0
Unit division: 0

## 2013-03-18 ENCOUNTER — Ambulatory Visit
Admission: RE | Admit: 2013-03-18 | Discharge: 2013-03-18 | Disposition: A | Discharge status: Home / Self Care | Payer: Medicare Other | Source: Ambulatory Visit | Attending: Radiation Oncology | Admitting: Radiation Oncology

## 2013-03-18 ENCOUNTER — Telehealth: Payer: Self-pay | Admitting: *Deleted

## 2013-03-18 ENCOUNTER — Encounter: Payer: Self-pay | Admitting: Radiation Oncology

## 2013-03-18 VITALS — BP 131/83 | HR 100 | Temp 98.4°F | Resp 20 | Ht 71.0 in | Wt 201.9 lb

## 2013-03-18 DIAGNOSIS — Z79899 Other long term (current) drug therapy: Secondary | ICD-10-CM | POA: Insufficient documentation

## 2013-03-18 DIAGNOSIS — C7951 Secondary malignant neoplasm of bone: Secondary | ICD-10-CM | POA: Insufficient documentation

## 2013-03-18 DIAGNOSIS — C61 Malignant neoplasm of prostate: Secondary | ICD-10-CM | POA: Insufficient documentation

## 2013-03-18 DIAGNOSIS — M25559 Pain in unspecified hip: Secondary | ICD-10-CM | POA: Insufficient documentation

## 2013-03-18 DIAGNOSIS — Z923 Personal history of irradiation: Secondary | ICD-10-CM | POA: Insufficient documentation

## 2013-03-18 DIAGNOSIS — C7952 Secondary malignant neoplasm of bone marrow: Secondary | ICD-10-CM

## 2013-03-18 MED ORDER — HYDROMORPHONE HCL 4 MG PO TABS: 4.0000 mg | ORAL_TABLET | ORAL | Status: DC | PRN

## 2013-03-18 NOTE — Telephone Encounter (Signed)
Called patient home left voice message to cvcall back to see if he could come in eary by 2pm or 230 pm instead of 3pm, we had a cancellation today,waiting for call back 11:39 AM

## 2013-03-18 NOTE — Progress Notes (Signed)
Radiation Oncology         [phone] ________________________________  Name: Stephen Stokes MRN: [id number]  Date: 03/18/2013  DOB: [birth year]  Follow-Up Visit Note  CC: Elizabeth Palau, MD  Maryanna Shape, NP  Zola Button, MD  Diagnosis:   Metastatic prostate cancer with bony metastases  Prior Radiation:   1.  T10-S3 to 30 Gy completed 03/25/2012   Narrative:  The patient returns today for routine follow-up.  The patient is seen today for consideration of additional palliative treatment. He finished one course of palliative radiation treatment to the spine and sacrum in January of 2014. The patient at this time is continuing on [place] which was begun in September of 2014. The patient complains of some pain in the hips, greater on the left. He states that this pain has been present he feels for approximately 6 months.  Last PSA level 1033 from December of 2014. Patient's last imaging study corresponded to a bone scan on 08/11/2012 which showed widespread skeletal metastases.                          ALLERGIES:  has No Known Allergies.  Meds: Current Outpatient Prescriptions  Medication Sig Dispense Refill  . acetaminophen (TYLENOL) 500 MG tablet Take 500 mg by mouth every 6 (six) hours as needed for mild pain or headache.      Marland Kitchen HYDROmorphone (DILAUDID) 4 MG tablet Take 1 tablet (4 mg total) by mouth every 4 (four) hours as needed for severe pain.  60 tablet  0  . ibuprofen (ADVIL,MOTRIN) 200 MG tablet Take 200 mg by mouth every 6 (six) hours as needed for mild pain.      Marland Kitchen ZYTIGA 250 MG tablet TAKE 4 TABLETS (1000 MG) BY MOUTH ONCE DAILY ON AN EMPTY STOMACH. (1 HOUR BEFORE OR 2 HOURS AFTER EATING). DO NOT CRUSH OR CHEW. SWALLOW WHO  120 tablet  0  . potassium chloride SA (K-DUR,KLOR-CON) 20 MEQ tablet Take 1 tablet (20 mEq total) by mouth daily.  30 tablet  1   No current facility-administered medications for this encounter.    Physical Findings: The patient is in no acute  distress. Patient is alert and oriented.  height is 5\' 11"  (1.803 m) and weight is 201 lb 14.4 oz (91.581 kg). His oral temperature is 98.4 F (36.9 C). His blood pressure is 131/83 and his pulse is 100. His respiration is 20. Marland Kitchen   General: Well-developed, in no acute distress HEENT: Normocephalic, atraumatic Cardiovascular: Regular rate and rhythm Respiratory: Clear to auscultation bilaterally GI: Soft, nontender, normal bowel sounds Extremities: No edema present; some tenderness present on palpation of the left hip laterally, no tenderness to palpation of the spine   Lab Findings: Lab Results  Component Value Date   WBC 6.4 03/13/2013   HGB 5.9* 03/13/2013   HCT 18.1* 03/13/2013   MCV 81.0 03/13/2013   PLT 198 03/13/2013     Radiographic Findings: No results found.  Impression:     the patient has metastatic prostate cancer with widespread osseous metastases. He is having some bilateral hip pain greater on the left. He is currently continuing on [place].  I have had a chance to review with him his prior imaging from June. This showed widespread metastasis but did not show greater activity in the dominant area of pain at this time in the left hip. I discussed with him that I would like to repeat his bone scan  since it has been 7 months. This should help clarify the best option for additional palliative treatment. If he has dominant activity in the area of concern or several sites then external beam radiation treatment would be reasonable. Otherwise, Trudi Ida  may be a good option for the patient.  Plan:   we'll proceed with a bone scan and the patient wished to come back for followup for discussion of this and to make a final decision on how best to proceed.  I spent 25 minutes with the patient today, the majority of which was spent counseling the patient on the diagnosis of cancer and coordinating care.   Jodelle Gross, M.D., Ph.D.

## 2013-03-19 ENCOUNTER — Telehealth: Payer: Self-pay | Admitting: *Deleted

## 2013-03-19 NOTE — Telephone Encounter (Signed)
CALLED PATIENT TO INFORM OF TEST AND RC VISIT, SPOKE WITH PATIENT AND HE IS AWARE OF THESE APPTS., MAILED APPT. CARDS TO PATIENT.

## 2013-03-28 ENCOUNTER — Other Ambulatory Visit: Payer: Self-pay

## 2013-03-28 ENCOUNTER — Emergency Department (HOSPITAL_COMMUNITY): Payer: Medicare Other

## 2013-03-28 ENCOUNTER — Inpatient Hospital Stay (HOSPITAL_COMMUNITY)
Admission: EM | Admit: 2013-03-28 | Discharge: 2013-04-01 | DRG: 689 | Disposition: A | Discharge status: Home / Self Care | Payer: Medicare Other | Attending: Internal Medicine | Admitting: Internal Medicine

## 2013-03-28 ENCOUNTER — Encounter (HOSPITAL_COMMUNITY): Payer: Self-pay | Admitting: Emergency Medicine

## 2013-03-28 DIAGNOSIS — R531 Weakness: Secondary | ICD-10-CM

## 2013-03-28 DIAGNOSIS — R748 Abnormal levels of other serum enzymes: Secondary | ICD-10-CM

## 2013-03-28 DIAGNOSIS — Z9079 Acquired absence of other genital organ(s): Secondary | ICD-10-CM

## 2013-03-28 DIAGNOSIS — C7952 Secondary malignant neoplasm of bone marrow: Secondary | ICD-10-CM

## 2013-03-28 DIAGNOSIS — R5381 Other malaise: Secondary | ICD-10-CM

## 2013-03-28 DIAGNOSIS — R0789 Other chest pain: Secondary | ICD-10-CM

## 2013-03-28 DIAGNOSIS — D638 Anemia in other chronic diseases classified elsewhere: Secondary | ICD-10-CM | POA: Diagnosis present

## 2013-03-28 DIAGNOSIS — C7951 Secondary malignant neoplasm of bone: Secondary | ICD-10-CM | POA: Diagnosis present

## 2013-03-28 DIAGNOSIS — Z6827 Body mass index (BMI) 27.0-27.9, adult: Secondary | ICD-10-CM

## 2013-03-28 DIAGNOSIS — F17200 Nicotine dependence, unspecified, uncomplicated: Secondary | ICD-10-CM | POA: Diagnosis present

## 2013-03-28 DIAGNOSIS — R627 Adult failure to thrive: Secondary | ICD-10-CM | POA: Diagnosis present

## 2013-03-28 DIAGNOSIS — R5383 Other fatigue: Secondary | ICD-10-CM

## 2013-03-28 DIAGNOSIS — E785 Hyperlipidemia, unspecified: Secondary | ICD-10-CM | POA: Diagnosis present

## 2013-03-28 DIAGNOSIS — E876 Hypokalemia: Secondary | ICD-10-CM

## 2013-03-28 DIAGNOSIS — N390 Urinary tract infection, site not specified: Secondary | ICD-10-CM | POA: Diagnosis present

## 2013-03-28 DIAGNOSIS — Z8719 Personal history of other diseases of the digestive system: Secondary | ICD-10-CM

## 2013-03-28 DIAGNOSIS — Z923 Personal history of irradiation: Secondary | ICD-10-CM

## 2013-03-28 DIAGNOSIS — N1330 Unspecified hydronephrosis: Secondary | ICD-10-CM

## 2013-03-28 DIAGNOSIS — B961 Klebsiella pneumoniae [K. pneumoniae] as the cause of diseases classified elsewhere: Secondary | ICD-10-CM | POA: Diagnosis present

## 2013-03-28 DIAGNOSIS — Z79899 Other long term (current) drug therapy: Secondary | ICD-10-CM

## 2013-03-28 DIAGNOSIS — K5790 Diverticulosis of intestine, part unspecified, without perforation or abscess without bleeding: Secondary | ICD-10-CM

## 2013-03-28 DIAGNOSIS — C61 Malignant neoplasm of prostate: Secondary | ICD-10-CM | POA: Diagnosis present

## 2013-03-28 DIAGNOSIS — R0602 Shortness of breath: Secondary | ICD-10-CM | POA: Diagnosis present

## 2013-03-28 DIAGNOSIS — J069 Acute upper respiratory infection, unspecified: Secondary | ICD-10-CM | POA: Diagnosis present

## 2013-03-28 DIAGNOSIS — R197 Diarrhea, unspecified: Secondary | ICD-10-CM | POA: Diagnosis present

## 2013-03-28 DIAGNOSIS — F411 Generalized anxiety disorder: Secondary | ICD-10-CM | POA: Diagnosis present

## 2013-03-28 DIAGNOSIS — R112 Nausea with vomiting, unspecified: Secondary | ICD-10-CM

## 2013-03-28 DIAGNOSIS — E8779 Other fluid overload: Secondary | ICD-10-CM | POA: Diagnosis not present

## 2013-03-28 DIAGNOSIS — E43 Unspecified severe protein-calorie malnutrition: Secondary | ICD-10-CM | POA: Diagnosis present

## 2013-03-28 DIAGNOSIS — I10 Essential (primary) hypertension: Secondary | ICD-10-CM

## 2013-03-28 LAB — CBC WITH DIFFERENTIAL/PLATELET
Basophils Absolute: 0 10*3/uL (ref 0.0–0.1)
Basophils Relative: 0 % (ref 0–1)
Eosinophils Absolute: 0.1 10*3/uL (ref 0.0–0.7)
Eosinophils Relative: 2 % (ref 0–5)
HCT: 28.1 % — ABNORMAL LOW (ref 39.0–52.0)
Hemoglobin: 8.9 g/dL — ABNORMAL LOW (ref 13.0–17.0)
Lymphocytes Relative: 17 % (ref 12–46)
Lymphs Abs: 0.8 10*3/uL (ref 0.7–4.0)
MCH: 25.8 pg — ABNORMAL LOW (ref 26.0–34.0)
MCHC: 31.7 g/dL (ref 30.0–36.0)
MCV: 81.4 fL (ref 78.0–100.0)
Monocytes Absolute: 0.4 10*3/uL (ref 0.1–1.0)
Monocytes Relative: 9 % (ref 3–12)
Neutro Abs: 3.5 10*3/uL (ref 1.7–7.7)
Neutrophils Relative %: 72 % (ref 43–77)
Platelets: 132 10*3/uL — ABNORMAL LOW (ref 150–400)
RBC: 3.45 MIL/uL — ABNORMAL LOW (ref 4.22–5.81)
RDW: 18.8 % — ABNORMAL HIGH (ref 11.5–15.5)
WBC: 4.8 10*3/uL (ref 4.0–10.5)

## 2013-03-28 LAB — URINALYSIS, ROUTINE W REFLEX MICROSCOPIC
Bilirubin Urine: NEGATIVE
Glucose, UA: NEGATIVE mg/dL
Ketones, ur: 15 mg/dL — AB
Nitrite: POSITIVE — AB
Protein, ur: 30 mg/dL — AB
Specific Gravity, Urine: 1.012 (ref 1.005–1.030)
Urobilinogen, UA: 4 mg/dL — ABNORMAL HIGH (ref 0.0–1.0)
pH: 7 (ref 5.0–8.0)

## 2013-03-28 LAB — COMPREHENSIVE METABOLIC PANEL
ALT: 6 U/L (ref 0–53)
AST: 15 U/L (ref 0–37)
Albumin: 3 g/dL — ABNORMAL LOW (ref 3.5–5.2)
Alkaline Phosphatase: 272 U/L — ABNORMAL HIGH (ref 39–117)
BUN: 6 mg/dL (ref 6–23)
CO2: 22 mEq/L (ref 19–32)
Calcium: 8.1 mg/dL — ABNORMAL LOW (ref 8.4–10.5)
Chloride: 102 mEq/L (ref 96–112)
Creatinine, Ser: 0.62 mg/dL (ref 0.50–1.35)
GFR calc Af Amer: >90 mL/min (ref >90)
GFR calc non Af Amer: >90 mL/min (ref >90)
Glucose, Bld: 116 mg/dL — ABNORMAL HIGH (ref 70–99)
Potassium: 2.5 mEq/L — CL (ref 3.7–5.3)
Sodium: 141 mEq/L (ref 137–147)
Total Bilirubin: 0.9 mg/dL (ref 0.3–1.2)
Total Protein: 6.7 g/dL (ref 6.0–8.3)

## 2013-03-28 LAB — URINE MICROSCOPIC-ADD ON

## 2013-03-28 LAB — MAGNESIUM: Magnesium: 1.8 mg/dL (ref 1.5–2.5)

## 2013-03-28 MED ORDER — DEXTROSE 5 % IV SOLN
1.0000 g | Freq: Once | INTRAVENOUS | Status: AC
  Administered 2013-03-28: 1 g via INTRAVENOUS
  Filled 2013-03-28: qty 10

## 2013-03-28 MED ORDER — ALUM & MAG HYDROXIDE-SIMETH 200-200-20 MG/5ML PO SUSP: 30.0000 mL | Freq: Four times a day (QID) | ORAL | Status: DC | PRN

## 2013-03-28 MED ORDER — MORPHINE SULFATE 4 MG/ML IJ SOLN
6.0000 mg | Freq: Once | INTRAMUSCULAR | Status: AC
  Administered 2013-03-28: 6 mg via INTRAVENOUS
  Filled 2013-03-28: qty 2

## 2013-03-28 MED ORDER — DEXTROSE 5 % IV SOLN
1.0000 g | INTRAVENOUS | Status: DC
  Administered 2013-03-29 – 2013-03-31 (×3): 1 g via INTRAVENOUS
  Filled 2013-03-28 (×4): qty 10

## 2013-03-28 MED ORDER — SODIUM CHLORIDE 0.9 % IV BOLUS (SEPSIS)
1000.0000 mL | Freq: Once | INTRAVENOUS | Status: AC
  Administered 2013-03-28: 1000 mL via INTRAVENOUS

## 2013-03-28 MED ORDER — POLYETHYLENE GLYCOL 3350 17 G PO PACK
17.0000 g | PACK | Freq: Every day | ORAL | Status: DC | PRN
  Filled 2013-03-28: qty 1

## 2013-03-28 MED ORDER — HYDROMORPHONE HCL PF 1 MG/ML IJ SOLN
1.0000 mg | Freq: Once | INTRAMUSCULAR | Status: AC
  Administered 2013-03-28: 1 mg via INTRAVENOUS
  Filled 2013-03-28: qty 1

## 2013-03-28 MED ORDER — HYDROMORPHONE HCL PF 1 MG/ML IJ SOLN
0.5000 mg | INTRAMUSCULAR | Status: DC | PRN
  Administered 2013-03-29 – 2013-03-31 (×5): 0.5 mg via INTRAVENOUS
  Filled 2013-03-28 (×5): qty 1

## 2013-03-28 MED ORDER — ACETAMINOPHEN 325 MG PO TABS
650.0000 mg | ORAL_TABLET | Freq: Four times a day (QID) | ORAL | Status: DC | PRN
  Administered 2013-03-29 – 2013-03-31 (×2): 650 mg via ORAL
  Filled 2013-03-28 (×2): qty 2

## 2013-03-28 MED ORDER — POTASSIUM CHLORIDE CRYS ER 20 MEQ PO TBCR
20.0000 meq | EXTENDED_RELEASE_TABLET | Freq: Two times a day (BID) | ORAL | Status: DC
  Administered 2013-03-28 – 2013-03-31 (×6): 20 meq via ORAL
  Filled 2013-03-28 (×7): qty 1

## 2013-03-28 MED ORDER — ONDANSETRON HCL 4 MG PO TABS: 4.0000 mg | ORAL_TABLET | Freq: Four times a day (QID) | ORAL | Status: DC | PRN

## 2013-03-28 MED ORDER — ACETAMINOPHEN 500 MG PO TABS
1000.0000 mg | ORAL_TABLET | Freq: Once | ORAL | Status: AC
  Administered 2013-03-28: 1000 mg via ORAL
  Filled 2013-03-28: qty 2

## 2013-03-28 MED ORDER — ONDANSETRON HCL 4 MG/2ML IJ SOLN
4.0000 mg | Freq: Four times a day (QID) | INTRAMUSCULAR | Status: DC | PRN
  Administered 2013-03-29: 4 mg via INTRAVENOUS
  Filled 2013-03-28: qty 2

## 2013-03-28 MED ORDER — OXYCODONE HCL 5 MG PO TABS
5.0000 mg | ORAL_TABLET | ORAL | Status: DC | PRN
  Administered 2013-03-30: 5 mg via ORAL
  Filled 2013-03-28: qty 1

## 2013-03-28 MED ORDER — ENOXAPARIN SODIUM 40 MG/0.4ML ~~LOC~~ SOLN
40.0000 mg | SUBCUTANEOUS | Status: DC
Start: 2013-03-28 — End: 2013-03-29
  Administered 2013-03-28: 40 mg via SUBCUTANEOUS
  Filled 2013-03-28 (×2): qty 0.4

## 2013-03-28 MED ORDER — ACETAMINOPHEN 650 MG RE SUPP: 650.0000 mg | Freq: Four times a day (QID) | RECTAL | Status: DC | PRN

## 2013-03-28 MED ORDER — ABIRATERONE ACETATE 250 MG PO TABS
1000.0000 mg | ORAL_TABLET | Freq: Every day | ORAL | Status: DC
  Administered 2013-03-28 – 2013-03-31 (×4): 1000 mg via ORAL

## 2013-03-28 MED ORDER — SODIUM CHLORIDE 0.9 % IV SOLN
INTRAVENOUS | Status: DC
  Administered 2013-03-28: 15:00:00 via INTRAVENOUS

## 2013-03-28 MED ORDER — POTASSIUM CHLORIDE 10 MEQ/100ML IV SOLN
10.0000 meq | INTRAVENOUS | Status: AC
  Administered 2013-03-28 (×2): 10 meq via INTRAVENOUS
  Filled 2013-03-28 (×2): qty 100

## 2013-03-28 NOTE — ED Notes (Signed)
Rounded on patient - he was lying on right side resting.  Pt c/o leg pain "again," with mild discomfort in left leg.  Made sure pt had call bell.  Pt remains on monitor.

## 2013-03-28 NOTE — ED Notes (Signed)
PA Lawyer at bedside.  

## 2013-03-28 NOTE — ED Notes (Signed)
2nd K bag hung.  Pt c/o pain in legs bilat.  Discussed with PA Lawyer and he will order pain medication.

## 2013-03-28 NOTE — ED Notes (Signed)
Potassium 2.5, PA Lawyer notified.

## 2013-03-28 NOTE — H&P (Signed)
[facility] History and Physical  Stephen Stokes WUG:[id number] DOB: [birth year] DOA: 03/28/2013  Referring physician: Dr. Rolland Porter PCP: Burtis Junes, MD   Chief Complaint: Weakness and intermittent dyspnea   History of Present Illness: Stephen Stokes is an 58 y.o. male with a PMH of advanced metastatic, castrate resistant prostate cancer diagnosed 03/2010 status post TURP and bilateral orchiectomy, status post palliative radiation treatment to T10-S3 completed 03/25/12, had progression of disease on Xtandi, now on Zytiga, and chronic anemia requiring PRBCs x 2, the last one given approximately 2 weeks ago, who presents with a 2 day history of dyspnea, but no cough and worsening weakness of uncertain but longstanding duration.  His symptoms are exacerbated by activities and talking. In the past, when he has had shortness of breath and weakness, he has responded favorably to blood transfusions. Has had low grade fever.  Did not have flu vaccination this year.  Upon initial evaluation in the ED, the patient was found to have a low-grade fever of 99.4, hypokalemia with a potassium of 2.5, anemia with a hemoglobin of 8.9, and evidence of a UTI on urinalysis. Chest x-ray did not show any obvious pneumonias, but did show some dilated small bowel loops of uncertain significance.  Review of Systems: Constitutional: No fever, no chills;  Appetite diminished; + weight loss, no weight gain, + fatigue.  HEENT: No blurry vision, no diplopia, no pharyngitis, no dysphagia CV: No chest pain, no palpitations, no PND.  Resp: No SOB, no cough, no pleuritic pain. GI: + nausea, + vomiting, no diarrhea, no melena, no hematochezia, no constipation.  GU: No dysuria, no hematuria, no frequency, + urgency. MSK: no myalgias, + hip and leg arthralgias.  Neuro:  No headache, no focal neurological deficits, no history of seizures.  Psych: + depression, no anxiety.  Endo: No heat intolerance, no cold intolerance, no  polyuria, no polydipsia  Skin: No rashes, no skin lesions.  Heme: No easy bruising.  Travel history: None in past 6 months.  Past Medical History Past Medical History  Diagnosis Date  . Hypertension   . Diverticulosis     Noted on CT abdomen (06/2010)  . Hyperlipidemia   . Elevated PSA 02/13/12    450.60  . Bone cancer     diffuse T-L spine mets  . 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 Patsi Sears (06/2010) // CT abdomen and pelvis  (06/2010) revealed signficant retroperitoneal and pelvic adenopathy // Bone scan (06/2010) - showed diffuse osseous abnormality  . Hot flashes   . Anxiety     mild new dx  . Radiation 03/11/12-03/25/12    Palliative 30 gray to T10-S3 in 10 fractions     Past Surgical History Past Surgical History  Procedure Laterality Date  . Orchiectomy  06/2010    bilateral  . Tonsillectomy    . Transurethral resection of prostate  06/2010    gleason 4+3=7     Social History: History   Social History  . Marital Status: Single    Spouse Name: N/A    Number of Children: 4  . Years of Education: N/A   Occupational History  . unemployed     previously worked as a Copy   Social History Main Topics  . Smoking status: Current Every Day Smoker -- 0.50 packs/day for 39 years    Types: Cigarettes  . Smokeless tobacco: Never Used  Comment: hx of 1 PPD  . Alcohol Use: Yes     Comment: occasional last 2 weeks ago, gin  . Drug Use: No     Comment: 4- 5  cigarettes daily  . Sexual Activity: Not on file   Other Topics Concern  . Not on file   Social History Narrative   Relocated from [place], [place] near 2000. He is currently single and previously worked as a Retail buyer.             Family History:  History reviewed. No pertinent family history.  Parents deceased.  Father drowned.  Patient has 4 brothers and 4 sisters but does not keep in touch  with them.  Unsure of age of death of either parent or cause of death in his mother.  Allergies: Potassium-containing compounds  Meds: Prior to Admission medications   Medication Sig Start Date End Date Taking? Authorizing Provider  abiraterone Acetate (ZYTIGA) 250 MG tablet Take 1,000 mg by mouth daily. Take on an empty stomach 1 hour before or 2 hours after a meal   Yes Historical Provider, MD  acetaminophen (TYLENOL) 500 MG tablet Take 500 mg by mouth every 6 (six) hours as needed for mild pain or headache.   Yes Historical Provider, MD  HYDROmorphone (DILAUDID) 4 MG tablet Take 1 tablet (4 mg total) by mouth every 4 (four) hours as needed for severe pain. 03/18/13  Yes Marye Round, MD  ibuprofen (ADVIL,MOTRIN) 200 MG tablet Take 200 mg by mouth every 6 (six) hours as needed for mild pain.   Yes Historical Provider, MD    Physical Exam: Filed Vitals:   03/28/13 1230 03/28/13 1239 03/28/13 1315 03/28/13 1330  BP: 115/68   107/69  Pulse: 115  92 93  Temp:  99.4 F (37.4 C)    TempSrc:      Resp: 30  27 34  SpO2: 97%  98% 98%     Physical Exam: Blood pressure 107/69, pulse 93, temperature 99.4 F (37.4 C), temperature source Oral, resp. rate 34, SpO2 98.00%. Gen: No acute distress. Head: Normocephalic, atraumatic. Eyes: PERRL, EOMI, sclerae nonicteric. Mouth: Oropharynx clear with slightly dry mucous membranes Neck: Supple, no thyromegaly, no lymphadenopathy, no jugular venous distention. Chest: Lungs diminished throughout. CV: Heart sounds are mildly tachycardic. No murmurs, rubs, or gallops. Abdomen: Soft, nontender, nondistended with normal active bowel sounds. Extremities: Extremities show 1+ edema bilaterally. Skin: Warm and dry. Neuro: Alert and oriented times 3; cranial nerves II through XII grossly intact. Lower extremity weakness, greater on left. Psych: Mood and affect depressed.  Labs on Admission:  Basic Metabolic Panel:  Recent Labs Lab 03/28/13 0920   NA 141  K 2.5*  CL 102  CO2 22  GLUCOSE 116*  BUN 6  CREATININE 0.62  CALCIUM 8.1*   Liver Function Tests:  Recent Labs Lab 03/28/13 0920  AST 15  ALT 6  ALKPHOS 272*  BILITOT 0.9  PROT 6.7  ALBUMIN 3.0*   CBC:  Recent Labs Lab 03/28/13 0920  WBC 4.8  NEUTROABS 3.5  HGB 8.9*  HCT 28.1*  MCV 81.4  PLT 132*   Urinalysis    Component Value Date/Time   COLORURINE YELLOW 03/28/2013 0929   APPEARANCEUR CLOUDY* 03/28/2013 0929   LABSPEC 1.012 03/28/2013 0929   LABSPEC 1.030 08/28/2012 1522   PHURINE 7.0 03/28/2013 0929   GLUCOSEU NEGATIVE 03/28/2013 0929   GLUCOSEU Negative 08/28/2012 1522   HGBUR MODERATE* 03/28/2013 0929   [place] NEGATIVE 03/28/2013 4235  KETONESUR 15* 03/28/2013 0929   PROTEINUR 30* 03/28/2013 0929   UROBILINOGEN 4.0* 03/28/2013 0929   UROBILINOGEN 0.2 08/28/2012 1522   NITRITE POSITIVE* 03/28/2013 0929   LEUKOCYTESUR LARGE* 03/28/2013 0929      Radiological Exams on Admission: Dg Chest 2 View  03/28/2013   CLINICAL DATA:  Fever and dyspnea, history of tobacco use, known widespread bony metastatic disease  EXAM: CHEST  2 VIEW  COMPARISON:  Portable chest x-ray of April 26, 2012.  FINDINGS: The lungs are mildly hypoinflated. There is no discrete alveolar infiltrate. The cardiopericardial silhouette is enlarged. The pulmonary interstitial markings are mildly prominent though stable. There is no pleural effusion. Scleroses of the bony structures diffusely is consistent with known progressive bony metastatic disease. There are loops of mildly distended small bowel in the left upper quadrant of the abdomen.  IMPRESSION: 1. The study is limited due to hypo inflation. There may be low-grade compensated CHF. There is no alveolar pneumonia nor significant pleural effusion. 2. The appearance of the skeleton is consistent with widespread metastatic disease which appears to have progressed since the previous study 3. There are loops of mildly distended small  bowel in the left upper quadrant of the abdomen which are nonspecific.   Electronically Signed   By: David  Martinique   On: 03/28/2013 10:04    EKG: Independently reviewed. Sinus tachycardia at 124 beats per minute, multiple PVCs.  Assessment/Plan Principal Problem:   UTI (urinary tract infection) Urinalysis consistent with UTI. Given one dose of Rocephin. Continue Rocephin while awaiting urine culture results. Active Problems:   Prostate cancer metastatic to multiple sites Has advanced metastatic prostate cancer and is failing to thrive with weight loss and malnutrition. We'll get a dietitian evaluation to assess nutritional status.   Shortness of breath Multifactorial with possible upper respiratory infection, anemia, electrolyte imbalances, deconditioning or contributory. No current indication for blood transfusion, but if hemoglobin drops with hydration, would transfuse for hemoglobin less than 7.   Anemia of chronic disease Monitor hemoglobin and transfuse for hemoglobin less than 7.   Hypokalemia Check magnesium. Replete potassium.   Nausea and vomiting Hydrate and provide anti-emetics when necessary. Loops of mildly distended small bowel noted in the left upper quadrant on chest x-ray of uncertain significance.   Generalized weakness Multifactorial with advanced metastatic cancer, anemia of chronic disease and electrolyte imbalances or contributory. PT evaluation when feeling better.  Code Status: Full. Family Communication: All of his children live in [place].  Lists Rae Halsted as his emergency contact [phone]). Disposition Plan: Home when stable.  Time spent: 70 minutes.  Stephen Stokes [facility] Pager [phone]  If 7PM-7AM, please contact night-coverage [email or web address] Password [email or web address] 03/28/2013, 2:10 PM

## 2013-03-28 NOTE — ED Notes (Addendum)
Pt reports shortness of breath and weakness began 1 week ago. He states " I normally wait a long time before I come in". C/o  Pain in hip and leg x several months.

## 2013-03-28 NOTE — ED Provider Notes (Signed)
CSN: [id number]     Arrival date & time 03/28/13  0806 History   First MD Initiated Contact with Patient 03/28/13 0831     Chief Complaint  Patient presents with  . Weakness  . Shortness of Breath   (Consider location/radiation/quality/duration/timing/severity/associated sxs/prior Treatment) HPI Stephen Stokes is a 58yo AA male with a PMH of Prostate and Bone Cancer who presents to the ED with weakness and SOB. This weakness began 2 weeks ago, but has worsened significantly over the past 2 days. The symptoms are exacerbated by activity, movement and talking. Sleep and prior blood transfusions help. Associated symptoms include dyspnea on exertion, dizziness, bone pain (especially the left hip and leg), and substernal chest pain that worsens with activity, but resolves with rest. He has approximately a 30 pack year history of smoking (quit 2 years ago), though denies alcohol and drug abuse.    Past Medical History  Diagnosis Date  . Hypertension   . Diverticulosis     Noted on CT abdomen (06/2010)  . Hyperlipidemia   . Elevated PSA 02/13/12    450.60  . Bone cancer     diffuse T-L spine mets  . 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 Gaynelle Arabian (06/2010) // CT abdomen and pelvis  (06/2010) revealed signficant retroperitoneal and pelvic adenopathy // Bone scan (06/2010) - showed diffuse osseous abnormality  . Hot flashes   . Anxiety     mild new dx  . Radiation 03/11/12-03/25/12    Palliative 30 gray to T10-S3 in 10 fractions   Past Surgical History  Procedure Laterality Date  . Orchiectomy  06/2010    bilateral  . Tonsillectomy    . Transurethral resection of prostate  06/2010    gleason 4+3=7   History reviewed. No pertinent family history. History  Substance Use Topics  . Smoking status: Current Every Day Smoker -- 0.50 packs/day for 39 years    Types: Cigarettes  . Smokeless  tobacco: Never Used     Comment: hx of 1 PPD  . Alcohol Use: Yes     Comment: occasional last 2 weeks ago, gin    Review of Systems All other systems negative except as documented in the HPI. All pertinent positives and negatives as reviewed in the HPI. Allergies  Potassium-containing compounds  Home Medications   Current Outpatient Rx  Name  Route  Sig  Dispense  Refill  . abiraterone Acetate (ZYTIGA) 250 MG tablet   Oral   Take 1,000 mg by mouth daily. Take on an empty stomach 1 hour before or 2 hours after a meal         . acetaminophen (TYLENOL) 500 MG tablet   Oral   Take 500 mg by mouth every 6 (six) hours as needed for mild pain or headache.         Marland Kitchen HYDROmorphone (DILAUDID) 4 MG tablet   Oral   Take 1 tablet (4 mg total) by mouth every 4 (four) hours as needed for severe pain.   60 tablet   0   . ibuprofen (ADVIL,MOTRIN) 200 MG tablet   Oral   Take 200 mg by mouth every 6 (six) hours as needed for mild pain.          BP 106/61  Pulse 90  Temp(Src) 99.4 F (37.4 C) (Oral)  Resp 30  SpO2 99% Physical Exam  Nursing note and  vitals reviewed. Constitutional: He is oriented to person, place, and time. He appears well-developed and well-nourished. No distress (mildly).  HENT:  Head: Normocephalic and atraumatic.  Mouth/Throat: Oropharynx is clear and moist.  Eyes: Pupils are equal, round, and reactive to light.  Pale conjunctiva  Neck: Normal range of motion. Neck supple.  Cardiovascular: Normal rate, regular rhythm and normal heart sounds.  Exam reveals no gallop and no friction rub.   No murmur heard. Regular rhythm (with occasional irregular beats), tachycardia to 235, systolic murmur best heard over pulmonic valve.   Pulmonary/Chest: Effort normal and breath sounds normal. He has no wheezes. He has no rales.  Increased work of breathing  Abdominal: Soft. Bowel sounds are normal. He exhibits no distension. There is no tenderness.  Neurological: He is  alert and oriented to person, place, and time. He exhibits normal muscle tone. Coordination normal.  Skin: Skin is warm and dry. No rash noted. He is not diaphoretic.    ED Course  Procedures (including critical care time) Labs Review Labs Reviewed  CBC WITH DIFFERENTIAL - Abnormal; Notable for the following:    RBC 3.45 (*)    Hemoglobin 8.9 (*)    HCT 28.1 (*)    MCH 25.8 (*)    RDW 18.8 (*)    Platelets 132 (*)    All other components within normal limits  COMPREHENSIVE METABOLIC PANEL - Abnormal; Notable for the following:    Potassium 2.5 (*)    Glucose, Bld 116 (*)    Calcium 8.1 (*)    Albumin 3.0 (*)    Alkaline Phosphatase 272 (*)    All other components within normal limits  URINALYSIS, ROUTINE W REFLEX MICROSCOPIC - Abnormal; Notable for the following:    APPearance CLOUDY (*)    Hgb urine dipstick MODERATE (*)    Ketones, ur 15 (*)    Protein, ur 30 (*)    Urobilinogen, UA 4.0 (*)    Nitrite POSITIVE (*)    Leukocytes, UA LARGE (*)    All other components within normal limits  URINE MICROSCOPIC-ADD ON - Abnormal; Notable for the following:    Squamous Epithelial / LPF FEW (*)    Bacteria, UA MANY (*)    All other components within normal limits  URINE CULTURE   Imaging Review Dg Chest 2 View  03/28/2013   CLINICAL DATA:  Fever and dyspnea, history of tobacco use, known widespread bony metastatic disease  EXAM: CHEST  2 VIEW  COMPARISON:  Portable chest x-ray of April 26, 2012.  FINDINGS: The lungs are mildly hypoinflated. There is no discrete alveolar infiltrate. The cardiopericardial silhouette is enlarged. The pulmonary interstitial markings are mildly prominent though stable. There is no pleural effusion. Scleroses of the bony structures diffusely is consistent with known progressive bony metastatic disease. There are loops of mildly distended small bowel in the left upper quadrant of the abdomen.  IMPRESSION: 1. The study is limited due to hypo inflation.  There may be low-grade compensated CHF. There is no alveolar pneumonia nor significant pleural effusion. 2. The appearance of the skeleton is consistent with widespread metastatic disease which appears to have progressed since the previous study 3. There are loops of mildly distended small bowel in the left upper quadrant of the abdomen which are nonspecific.   Electronically Signed   By: David  Martinique   On: 03/28/2013 10:04      MDM   1. UTI (urinary tract infection)   2. Weakness  3. Hypokalemia     Given [name]' significant lab results [UA (UTI), CMP (Hypokalemia), and CBC (anemia)] alongside his weakness, SOB, and initial fever (~101), admission to the hospital is planned. Tylenol has reduced his temperature to 99.4, and Rocephin, and Morphine have been started. Potassium is being provided to amend his hypokalemia.   Brent General, PA-C 03/29/13 [phone]

## 2013-03-28 NOTE — ED Notes (Signed)
Dr Rama at bedside 

## 2013-03-28 NOTE — ED Notes (Signed)
Pt aware of the need for a urine sample. Urinal at bedside. 

## 2013-03-29 DIAGNOSIS — C61 Malignant neoplasm of prostate: Secondary | ICD-10-CM

## 2013-03-29 DIAGNOSIS — C800 Disseminated malignant neoplasm, unspecified: Secondary | ICD-10-CM

## 2013-03-29 LAB — BASIC METABOLIC PANEL
BUN: 5 mg/dL — AB (ref 6–23)
CALCIUM: 7.4 mg/dL — AB (ref 8.4–10.5)
CHLORIDE: 102 meq/L (ref 96–112)
CO2: 21 meq/L (ref 19–32)
CREATININE: 0.59 mg/dL (ref 0.50–1.35)
GFR calc Af Amer: >90 mL/min (ref >90)
GFR calc non Af Amer: >90 mL/min (ref >90)
Glucose, Bld: 113 mg/dL — ABNORMAL HIGH (ref 70–99)
Potassium: 2.5 mEq/L — CL (ref 3.7–5.3)
Sodium: 140 mEq/L (ref 137–147)

## 2013-03-29 LAB — CBC
HEMATOCRIT: 14.6 % — AB (ref 39.0–52.0)
Hemoglobin: 4.5 g/dL — CL (ref 13.0–17.0)
MCH: 25 pg — ABNORMAL LOW (ref 26.0–34.0)
MCHC: 30.8 g/dL (ref 30.0–36.0)
MCV: 81.1 fL (ref 78.0–100.0)
Platelets: 145 10*3/uL — ABNORMAL LOW (ref 150–400)
RBC: 1.8 MIL/uL — ABNORMAL LOW (ref 4.22–5.81)
RDW: 18.9 % — ABNORMAL HIGH (ref 11.5–15.5)
WBC: 7.5 10*3/uL (ref 4.0–10.5)

## 2013-03-29 LAB — PREPARE RBC (CROSSMATCH)

## 2013-03-29 MED ORDER — POTASSIUM CHLORIDE IN NACL 40-0.9 MEQ/L-% IV SOLN
INTRAVENOUS | Status: DC
  Administered 2013-03-29 – 2013-03-30 (×2): via INTRAVENOUS
  Filled 2013-03-29 (×5): qty 1000

## 2013-03-29 MED ORDER — ALBUTEROL SULFATE (2.5 MG/3ML) 0.083% IN NEBU
2.5000 mg | INHALATION_SOLUTION | RESPIRATORY_TRACT | Status: DC | PRN
  Administered 2013-03-29: 2.5 mg via RESPIRATORY_TRACT
  Filled 2013-03-29: qty 3

## 2013-03-29 MED ORDER — POTASSIUM CHLORIDE 10 MEQ/100ML IV SOLN
10.0000 meq | INTRAVENOUS | Status: AC
  Administered 2013-03-29 (×3): 10 meq via INTRAVENOUS
  Filled 2013-03-29 (×4): qty 100

## 2013-03-29 NOTE — ED Provider Notes (Signed)
Medical screening examination/treatment/procedure(s) were performed by non-physician practitioner and as supervising physician I was immediately available for consultation/collaboration.  EKG Interpretation    Date/Time:    Ventricular Rate:    PR Interval:    QRS Duration:   QT Interval:    QTC Calculation:   R Axis:     Text Interpretation:                Tanna Furry, MD 03/29/13 [id number]

## 2013-03-29 NOTE — Progress Notes (Signed)
[facility] PROGRESS NOTE   [name] GQQ:[id number] DOB: 1955/04/04 DOA: 03/28/2013 PCP: Elizabeth Palau, MD  Brief narrative: Stephen Stokes is an 58 y.o. male with a PMH of advanced metastatic, castrate resistant prostate cancer diagnosed 03/2010 status post TURP and bilateral orchiectomy, status post palliative radiation treatment to T10-S3 completed 03/25/12, had progression of disease on Xtandi, now on Zytiga, and chronic anemia requiring PRBCs x 2, the last one given approximately 2 weeks ago, who was admitted on 03/28/13 with a 2 day history of dyspnea, but no cough and worsening weakness of uncertain but longstanding duration. Upon initial evaluation in the ED, the patient was found to have a low-grade fever of 99.4, hypokalemia with a potassium of 2.5, anemia with a hemoglobin of 8.9, and evidence of a UTI on urinalysis. Chest x-ray did not show any obvious pneumonia, but did show some dilated small bowel loops of uncertain significance.  Assessment/Plan: Principal Problem:  UTI (urinary tract infection)  Urinalysis consistent with UTI. Continue Rocephin while awaiting urine culture results.  Active Problems:  Prostate cancer metastatic to multiple sites  Has advanced metastatic prostate cancer and is failing to thrive with weight loss and malnutrition. Dietitian consultation requested.  Shortness of breath  Multifactorial with possible upper respiratory infection, anemia, electrolyte imbalances, deconditioning all contributory. Transfuse 3 units of PRBCs today, continue to supplement potassium.  Anemia of chronic disease  Hemoglobin dropped 4.4 g overnight.  Will give 3 units of PRBCs. Hypokalemia  Magnesium okay. Repleting potassium.  Nausea and vomiting  Hydrate and provide anti-emetics when necessary. Loops of mildly distended small bowel noted in the left upper quadrant on chest x-ray of uncertain significance.  Generalized weakness  Multifactorial with advanced  metastatic cancer, anemia of chronic disease and electrolyte imbalances or contributory. PT evaluation when feeling better.   Code Status: Full.  Family Communication: All of his children live in [place]. Lists Rae Halsted as his emergency contact [phone]).  Disposition Plan: Home when stable.    IV access:  Peripheral IV  Medical Consultants:  None  Other Consultants:  Dietitian  Anti-infectives:  Rocephin 03/28/13--->  HPI/Subjective: [name] continues to feel weak, dyspneic.  Denies pain after receiving a "pain pill".  No nausea or vomiting but does have some diarrhea.  Objective: Filed Vitals:   03/28/13 1413 03/28/13 2200 03/29/13 0525 03/29/13 0915  BP: 106/72 115/78 113/77 115/77  Pulse: 93 108 118 96  Temp: 98.8 F (37.1 C) 100.2 F (37.9 C) 100 F (37.8 C) 98.8 F (37.1 C)  TempSrc: Oral  Oral Oral  Resp: 22 22 20    Height: 5\' 10"  (1.778 m)     Weight: 86.546 kg (190 lb 12.8 oz)     SpO2: 98% 96% 96% 100%    Intake/Output Summary (Last 24 hours) at 03/29/13 1059 Last data filed at 03/29/13 0327  Gross per 24 hour  Intake      0 ml  Output    850 ml  Net   -850 ml    Exam: Gen:  NAD Cardiovascular:  Tachycardic, No M/R/G Respiratory:  Lungs CTAB Gastrointestinal:  Abdomen soft, NT/ND, + BS Extremities:  No C/E/C  Data Reviewed: Basic Metabolic Panel:  Recent Labs Lab 03/28/13 0920 03/28/13 1554 03/29/13 0555  NA 141  --  140  K 2.5*  --  2.5*  CL 102  --  102  CO2 22  --  21  GLUCOSE 116*  --  113*  BUN 6  --  5*  CREATININE 0.62  --  0.59  CALCIUM 8.1*  --  7.4*  MG  --  1.8  --    GFR Estimated Creatinine Clearance: 106.5 ml/min (by C-G formula based on Cr of 0.59). Liver Function Tests:  Recent Labs Lab 03/28/13 0920  AST 15  ALT 6  ALKPHOS 272*  BILITOT 0.9  PROT 6.7  ALBUMIN 3.0*   CBC:  Recent Labs Lab 03/28/13 0920 03/29/13 0825  WBC 4.8 7.5  NEUTROABS 3.5  --   HGB 8.9* 4.5*  HCT 28.1* 14.6*  MCV  81.4 81.1  PLT 132* 145*   Microbiology No results found for this or any previous visit (from the past 240 hour(s)).   Procedures and Diagnostic Studies: Dg Chest 2 View  03/28/2013   CLINICAL DATA:  Fever and dyspnea, history of tobacco use, known widespread bony metastatic disease  EXAM: CHEST  2 VIEW  COMPARISON:  Portable chest x-ray of April 26, 2012.  FINDINGS: The lungs are mildly hypoinflated. There is no discrete alveolar infiltrate. The cardiopericardial silhouette is enlarged. The pulmonary interstitial markings are mildly prominent though stable. There is no pleural effusion. Scleroses of the bony structures diffusely is consistent with known progressive bony metastatic disease. There are loops of mildly distended small bowel in the left upper quadrant of the abdomen.  IMPRESSION: 1. The study is limited due to hypo inflation. There may be low-grade compensated CHF. There is no alveolar pneumonia nor significant pleural effusion. 2. The appearance of the skeleton is consistent with widespread metastatic disease which appears to have progressed since the previous study 3. There are loops of mildly distended small bowel in the left upper quadrant of the abdomen which are nonspecific.   Electronically Signed   By: David  Martinique   On: 03/28/2013 10:04    Scheduled Meds: . abiraterone Acetate  1,000 mg Oral Daily  . cefTRIAXone (ROCEPHIN)  IV  1 g Intravenous Q24H  . enoxaparin (LOVENOX) injection  40 mg Subcutaneous Q24H  . potassium chloride  10 mEq Intravenous Q1 Hr x 4  . potassium chloride  20 mEq Oral BID   Continuous Infusions: . 0.9 % NaCl with KCl 40 mEq / L 75 mL/hr at 03/29/13 0911    Time spent: 35 minutes with > 50% of time discussing current diagnostic test results, clinical impression and plan of care.    LOS: 1 day   Rondarius Kadrmas  [facility] Pager [phone].   *Please note that the hospitalists switch teams on Wednesdays. Please call the flow manager  at [phone] if you are having difficulty reaching the hospitalist taking care of this patient as she can update you and provide the most up-to-date pager number of provider caring for the patient. If 8PM-8AM, please contact night-coverage at [email or web address], password [facility]  03/29/2013, 10:59 AM

## 2013-03-30 ENCOUNTER — Encounter (HOSPITAL_COMMUNITY): Admission: RE | Admit: 2013-03-30 | Payer: Medicare Other | Source: Ambulatory Visit

## 2013-03-30 ENCOUNTER — Encounter (HOSPITAL_COMMUNITY): Payer: Medicare Other

## 2013-03-30 DIAGNOSIS — E43 Unspecified severe protein-calorie malnutrition: Secondary | ICD-10-CM | POA: Diagnosis present

## 2013-03-30 DIAGNOSIS — R197 Diarrhea, unspecified: Secondary | ICD-10-CM | POA: Diagnosis present

## 2013-03-30 LAB — BASIC METABOLIC PANEL
BUN: 6 mg/dL (ref 6–23)
CHLORIDE: 105 meq/L (ref 96–112)
CO2: 18 mEq/L — ABNORMAL LOW (ref 19–32)
Calcium: 7.7 mg/dL — ABNORMAL LOW (ref 8.4–10.5)
Creatinine, Ser: 0.58 mg/dL (ref 0.50–1.35)
GFR calc non Af Amer: >90 mL/min (ref >90)
Glucose, Bld: 109 mg/dL — ABNORMAL HIGH (ref 70–99)
POTASSIUM: 3.5 meq/L — AB (ref 3.7–5.3)
Sodium: 142 mEq/L (ref 137–147)

## 2013-03-30 LAB — URINE CULTURE: Colony Count: >=100000

## 2013-03-30 LAB — CBC
HEMATOCRIT: 20.4 % — AB (ref 39.0–52.0)
Hemoglobin: 6.8 g/dL — CL (ref 13.0–17.0)
MCH: 26.5 pg (ref 26.0–34.0)
MCHC: 33.3 g/dL (ref 30.0–36.0)
MCV: 79.4 fL (ref 78.0–100.0)
Platelets: 134 10*3/uL — ABNORMAL LOW (ref 150–400)
RBC: 2.57 MIL/uL — ABNORMAL LOW (ref 4.22–5.81)
RDW: 18.6 % — AB (ref 11.5–15.5)
WBC: 7.2 10*3/uL (ref 4.0–10.5)

## 2013-03-30 LAB — PREPARE RBC (CROSSMATCH)

## 2013-03-30 MED ORDER — DIPHENOXYLATE-ATROPINE 2.5-0.025 MG/5ML PO LIQD
10.0000 mL | Freq: Four times a day (QID) | ORAL | Status: DC | PRN
  Administered 2013-03-30 – 2013-03-31 (×2): 10 mL via ORAL
  Filled 2013-03-30 (×3): qty 10

## 2013-03-30 MED ORDER — ENSURE COMPLETE PO LIQD
237.0000 mL | Freq: Three times a day (TID) | ORAL | Status: DC
  Administered 2013-03-30 – 2013-03-31 (×3): 237 mL via ORAL

## 2013-03-30 MED ORDER — FUROSEMIDE 10 MG/ML IJ SOLN
40.0000 mg | Freq: Once | INTRAMUSCULAR | Status: AC
  Administered 2013-03-30: 40 mg via INTRAVENOUS
  Filled 2013-03-30: qty 4

## 2013-03-30 NOTE — Evaluation (Signed)
Physical Therapy Evaluation Patient Details Name: Stephen Stokes MRN: [id number] DOB: Dec 24, 1955 Today's Date: 03/30/2013 Time: 6063-0160 PT Time Calculation (min): 24 min  PT Assessment / Plan / Recommendation History of Present Illness  Stephen Stokes is an 58 y.o. male with a PMH of advanced metastatic, castrate resistant prostate cancer diagnosed 03/2010 status post TURP and bilateral orchiectomy, status post palliative radiation treatment to T10-S3 completed 03/25/12, had progression of disease on Xtandi, now on Zytiga, and chronic anemia requiring PRBCs x 2, the last one given approximately 2 weeks ago, who was admitted on 03/28/13 with a 2 day history of dyspnea, but no cough and worsening weakness of uncertain but longstanding duration. Upon initial evaluation in the ED, the patient was found to have a low-grade fever of 99.4, hypokalemia with a potassium of 2.5, anemia with a hemoglobin of 8.9, and evidence of a UTI on urinalysis.  Clinical Impression  *Pt admitted with metastatic prostate cancer, UTI, anemia, hypokalemia*. Pt currently with functional limitations due to the deficits listed below (see PT Problem List).  Pt will benefit from skilled PT to increase their independence and safety with mobility to allow discharge to the venue listed below.   **    PT Assessment  Patient needs continued PT services    Follow Up Recommendations  SNF (SNF vs HH -with home health aide if possible)    Does the patient have the potential to tolerate intense rehabilitation      Barriers to Discharge Decreased caregiver support Pt feels he needs more help at home than he currently has. He stated he needs assist for bathing and dressing.    Equipment Recommendations  None recommended by PT    Recommendations for Other Services     Frequency Min 3X/week    Precautions / Restrictions     Pertinent Vitals/Pain *SaO2 93% on RA, HR 110 with activity**      Mobility  Transfers Overall transfer  level: Independent Ambulation/Gait Ambulation/Gait assistance: Supervision Ambulation Distance (Feet): 25 Feet Gait velocity interpretation: at or above normal speed for age/gender General Gait Details: Distance limited by frequent urination; 2/4 dyspnea with walking, HR 110, noted pt admitted with anemia and received 3 units of blood yesterday, 1 today    Exercises     PT Diagnosis: Generalized weakness  PT Problem List: Decreased range of motion;Decreased strength;Decreased activity tolerance;Decreased mobility PT Treatment Interventions: Gait training;Stair training;Functional mobility training;Therapeutic exercise;Therapeutic activities;Patient/family education     PT Goals(Current goals can be found in the care plan section) Acute Rehab PT Goals Patient Stated Goal: ideally pt would like to DC home and have home health aide PT Goal Formulation: With patient Time For Goal Achievement: 04/13/13 Potential to Achieve Goals: Fair  Visit Information  Last PT Received On: 03/30/13 Assistance Needed: +1 History of Present Illness: Stephen Stokes is an 58 y.o. male with a PMH of advanced metastatic, castrate resistant prostate cancer diagnosed 03/2010 status post TURP and bilateral orchiectomy, status post palliative radiation treatment to T10-S3 completed 03/25/12, had progression of disease on Xtandi, now on Zytiga, and chronic anemia requiring PRBCs x 2, the last one given approximately 2 weeks ago, who was admitted on 03/28/13 with a 2 day history of dyspnea, but no cough and worsening weakness of uncertain but longstanding duration. Upon initial evaluation in the ED, the patient was found to have a low-grade fever of 99.4, hypokalemia with a potassium of 2.5, anemia with a hemoglobin of 8.9, and evidence of a UTI on urinalysis.  Prior [place] expects to be discharged to:: Private residence Living Arrangements: Other (Comment) (lives with a friend who  works) Available Help at Discharge: Friend(s) Type of Home: House Home Access: Stairs to enter Technical brewer of Steps: 3 Entrance Stairs-Rails: Right [place]: One [place]: Environmental consultant - 2 wheels;Cane - single point Additional Comments: occaissionally used cane Prior Function Level of Independence: Independent with assistive device(s) Comments: pt stated that caring for himself at home is becoming difficult Communication Communication: No difficulties    Cognition  Cognition Arousal/Alertness: Awake/alert Behavior During Therapy: WFL for tasks assessed/performed Overall Cognitive Status: Within Functional Limits for tasks assessed    Extremity/Trunk Assessment Upper Extremity Assessment Upper Extremity Assessment: Overall WFL for tasks assessed Lower Extremity Assessment Lower Extremity Assessment: LLE deficits/detail;RLE deficits/detail RLE Deficits / Details: knee extension -2/5 LLE Deficits / Details: knee extension -3/5; B legs feel "like lead" Cervical / Trunk Assessment Cervical / Trunk Assessment: Normal   Balance Balance Overall balance assessment: Independent  End of Session PT - End of Session Activity Tolerance: Patient limited by fatigue Patient left: in bed;with call bell/phone within reach Nurse Communication: Mobility status  GP     Stephen Stokes 03/30/2013, 1:31 PM [phone]

## 2013-03-30 NOTE — Progress Notes (Signed)
INITIAL NUTRITION ASSESSMENT  DOCUMENTATION CODES Per approved criteria  -Severe malnutrition in the context of chronic illness  Pt meets criteria for severe MALNUTRITION in the context of chronic illness as evidenced by reported intake meeting less than 75% of estimated needs for >1 month and wt loss of 17% in <6 months.  INTERVENTION: - Ensure Complete po TID, each supplement provides 350 kcal and 13 grams of protein - Pt encouraged to try [employer]entials at home to increase calorie and protein intake.  NUTRITION DIAGNOSIS: Inadequate oral intake related to metastatic prostate cancer as evidenced by reported intake less than estimated needs and wt loss.   Goal: Pt to meet >/= 90% of their estimated nutrition needs   Monitor:  Wt, po intake, acceptance of supplements, labs  Reason for Assessment: Consult  58 y.o. male  Admitting Dx: UTI (urinary tract infection)  ASSESSMENT: 58 y.o. male with a PMH of advanced metastatic, castrate resistant prostate cancer diagnosed 03/2010 status post TURP and bilateral orchiectomy, status post palliative radiation treatment to T10-S3 completed 03/25/12, had progression of disease on Xtandi, now on Zytiga, and chronic anemia requiring PRBCs x 2, the last one given approximately 2 weeks ago, who presents with a 2 day history of dyspnea, but no cough and worsening weakness of uncertain but longstanding duration.  Pt reports that he has had no appetite for the past 4 months. He also reports he weighed 230 lbs about 4 months ago. This reflects a wt loss of 17%. He was encouraged to try nutritional supplements at home to slow weight loss.  Height: Ht Readings from Last 1 Encounters:  03/28/13 5\' 10"  (1.778 m)    Weight: Wt Readings from Last 1 Encounters:  03/28/13 190 lb 12.8 oz (86.546 kg)    Ideal Body Weight: 73 kg  % Ideal Body Weight: 118%  Wt Readings from Last 10 Encounters:  03/28/13 190 lb 12.8 oz (86.546 kg)   03/18/13 201 lb 14.4 oz (91.581 kg)  02/27/13 203 lb 6.4 oz (92.262 kg)  01/21/13 214 lb 9.6 oz (97.342 kg)  12/10/12 221 lb 14.4 oz (100.653 kg)  11/12/12 229 lb (103.874 kg)  10/02/12 227 lb 12.8 oz (103.329 kg)  08/28/12 219 lb 4.8 oz (99.474 kg)  07/10/12 220 lb 8 oz (100.018 kg)  06/02/12 215 lb 14.4 oz (97.932 kg)    Usual Body Weight: 230 lbs  % Usual Body Weight: 83%  BMI:  Body mass index is 27.38 kg/(m^2).  Estimated Nutritional Needs: Kcal: 2150-2500 Protein: 105-120 g Fluid: 2.2-2.5 L  Skin: WNL  Diet Order: General  EDUCATION NEEDS: -Education needs addressed   Intake/Output Summary (Last 24 hours) at 03/30/13 1323 Last data filed at 03/30/13 1224  Gross per 24 hour  Intake 1891.25 ml  Output    850 ml  Net 1041.25 ml    Last BM: 1/19   Labs:   Recent Labs Lab 03/28/13 0920 03/28/13 1554 03/29/13 0555 03/30/13 0600  NA 141  --  140 142  K 2.5*  --  2.5* 3.5*  CL 102  --  102 105  CO2 22  --  21 18*  BUN 6  --  5* 6  CREATININE 0.62  --  0.59 0.58  CALCIUM 8.1*  --  7.4* 7.7*  MG  --  1.8  --   --   GLUCOSE 116*  --  113* 109*    CBG (last 3)  No results found for this basename: GLUCAP,  in the last 72 hours  Scheduled Meds: . abiraterone Acetate  1,000 mg Oral Daily  . cefTRIAXone (ROCEPHIN)  IV  1 g Intravenous Q24H  . potassium chloride  20 mEq Oral BID    Continuous Infusions: . 0.9 % NaCl with KCl 40 mEq / L 10 mL/hr (03/30/13 1051)    Past Medical History  Diagnosis Date  . Hypertension   . Diverticulosis     Noted on CT abdomen (06/2010)  . Hyperlipidemia   . Elevated PSA 02/13/12    450.60  . Bone cancer     diffuse T-L spine mets  . 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 Gaynelle Arabian (06/2010) // CT abdomen and pelvis  (06/2010) revealed signficant retroperitoneal and pelvic adenopathy // Bone scan (06/2010) -  showed diffuse osseous abnormality  . Hot flashes   . Anxiety     mild new dx  . Radiation 03/11/12-03/25/12    Palliative 30 gray to T10-S3 in 10 fractions    Past Surgical History  Procedure Laterality Date  . Orchiectomy  06/2010    bilateral  . Tonsillectomy    . Transurethral resection of prostate  06/2010    gleason 4+3=7    Terrace Arabia RD, LDN

## 2013-03-30 NOTE — Progress Notes (Signed)
Clinical Social Work Department BRIEF PSYCHOSOCIAL ASSESSMENT 03/30/2013  Patient:  Stephen Stokes,Stephen Stokes     Account Number:  [id number]     Admit date:  03/28/2013  Clinical Social Worker:  Earlie Server  Date/Time:  03/30/2013 03:00 PM  Referred by:  Care Management  Date Referred:  03/30/2013 Referred for  SNF Placement   Other Referral:   Interview type:  Patient Other interview type:    PSYCHOSOCIAL DATA Living Status:  FRIEND(S) Admitted from facility:   Level of care:   Primary support name:  Evette Doffing Primary support relationship to patient:  FRIEND Degree of support available:   Howard Pouch # [phone]    CURRENT CONCERNS Current Concerns  Post-Acute Placement   Other Concerns:    SOCIAL WORK ASSESSMENT / PLAN CSW received referral to assist with DC planning. CSW reviewed chart and met with patient and friend at bedside. CSW introduced myself and explained role. Patient agreeable to friend being involved in assessment.    Patient lives at home with friend but friend works from 3pm-12pm daily. Patient is at home by himself and reports he may be starting radiation soon. Patient reports he is doing well at home but friend reports that patient is weak and that he is worried about him being home alone. Patient is not interested in SNF and reports he will not go to a nursing home. CSW provided SNF list and explained rehab options at facilities. CSW discussed Medicare coverage for SNF and that patient could choose facility. Patient agreeable to [facility] search but still wants to review options.    CSW completed FL2 and faxed out. CSW will follow up with bed offers.   Assessment/plan status:  Psychosocial Support/Ongoing Assessment of Needs Other assessment/ plan:   Information/referral to community resources:   SNF information    PATIENT'S/FAMILY'S RESPONSE TO PLAN OF CARE: Patient alert and oriented. Patient understanding of SNF benefits but reports he is not  old and does not need a babysitter. Patient is worried that a SNF will try and make him be a long term resident. CSW explained patient's right to leave whenever he wanted and encouraged patient to at least consider options. Patient to talk with friend and agreeable for CSW to follow up with offers.       Ponca, Lucien [phone]

## 2013-03-30 NOTE — Progress Notes (Addendum)
Clinical Social Work Department CLINICAL SOCIAL WORK PLACEMENT NOTE 03/30/2013  Patient:  Stephen Stokes,Stephen Stokes  Account Number:  [id number] Admit date:  03/28/2013  Clinical Social Worker:  Sindy Messing, LCSW  Date/time:  03/30/2013 03:00 PM  Clinical Social Work is seeking post-discharge placement for this patient at the following level of care:   [place]   (*CSW will update this form in Epic as items are completed)   03/30/2013  Patient/family provided with [place] Department of Clinical Social Work's list of facilities offering this level of care within the geographic area requested by the patient (or if unable, by the patient's family).  03/30/2013  Patient/family informed of their freedom to choose among providers that offer the needed level of care, that participate in Medicare, Medicaid or managed care program needed by the patient, have an available bed and are willing to accept the patient.  03/30/2013  Patient/family informed of [facility]' ownership interest in [facility], as well as of the fact that they are under no obligation to receive care at this facility.  PASARR submitted to EDS on 03/30/2013 PASARR number received from EDS on 03/30/2013  FL2 transmitted to all facilities in geographic area requested by pt/family on  03/30/2013 FL2 transmitted to all facilities within larger geographic area on   Patient informed that his/her managed care company has contracts with or will negotiate with  certain facilities, including the following:     Patient/family informed of bed offers received:  03/31/13 Patient chooses bed at  Physician recommends and patient chooses bed at    Patient to be transferred to  on   Patient to be transferred to facility by   The following physician request were entered in Epic:   Additional Comments:  04/01/13-Pt refusing SNF and will return home

## 2013-03-30 NOTE — Progress Notes (Addendum)
[facility] PROGRESS NOTE   Stephen Stokes XHB:[id number] DOB: 07-Mar-1956 DOA: 03/28/2013 PCP: Elizabeth Palau, MD  Brief narrative: Stephen Stokes is an 58 y.o. male with a PMH of advanced metastatic, castrate resistant prostate cancer diagnosed 03/2010 status post TURP and bilateral orchiectomy, status post palliative radiation treatment to T10-S3 completed 03/25/12, had progression of disease on Xtandi, now on Zytiga, and chronic anemia requiring PRBCs x 2, the last one given approximately 2 weeks ago, who was admitted on 03/28/13 with a 2 day history of dyspnea, but no cough and worsening weakness of uncertain but longstanding duration. Upon initial evaluation in the ED, the patient was found to have a low-grade fever of 99.4, hypokalemia with a potassium of 2.5, anemia with a hemoglobin of 8.9, and evidence of a UTI on urinalysis. Chest x-ray did not show any obvious pneumonia, but did show some dilated small bowel loops of uncertain significance.  Assessment/Plan: Principal Problem:  UTI (urinary tract infection)  Urinalysis consistent with UTI. Continue Rocephin while awaiting urine culture results. Preliminary cultures growing greater than 100,000 colonies of GNR. Active Problems:  Severe protein calorie malnutrition Seen by dietician 03/21/13.  Placed on nutritional supplements. Prostate cancer metastatic to multiple sites  Has advanced metastatic prostate cancer and is failing to thrive with weight loss and malnutrition. Dietitian consultation requested.  Shortness of breath  Multifactorial with possible upper respiratory infection, anemia, electrolyte imbalances, deconditioning all contributory. Status post 3 units of PRBCs 03/29/13, will give an additional unit today. Lasix 40 mg IV x 1.  Likely volume overloaded from 4 units of PRBCs. Anemia of chronic disease  Hemoglobin rose to 0.3 g after 3 units of blood.  Hemoglobin this morning now 6.8 mg/dL, will transfuse an additional  unit today. Hypokalemia  Magnesium okay. Potassium improving with supplementation.  Nausea and vomiting / diarrhea  Hydrate and provide anti-emetics when necessary. Loops of mildly distended small bowel noted in the left upper quadrant on chest x-ray of uncertain significance. Lomotil PRN. Generalized weakness  Multifactorial with advanced metastatic cancer, anemia of chronic disease and electrolyte imbalances or contributory. PT evaluation.   Code Status: Full.  Family Communication: All of his children live in [place]. Lists Rae Halsted as his emergency contact [phone]).  Disposition Plan: Home when stable.    IV access:  Peripheral IV  Medical Consultants:  None  Other Consultants:  Dietitian  Anti-infectives:  Rocephin 03/28/13--->  HPI/Subjective: Stephen Stokes continues to feel weak, dyspneic.  Denies pain after receiving a "pain pill".  No nausea or vomiting but does have some diarrhea.  Objective: Filed Vitals:   03/30/13 0035 03/30/13 0140 03/30/13 0204 03/30/13 0624  BP: 106/71 144/98 128/87 140/75  Pulse: 118 120 122 120  Temp: 99 F (37.2 C) 97.9 F (36.6 C) 98.5 F (36.9 C) 99.1 F (37.3 C)  TempSrc: Oral Oral Oral Oral  Resp: 20 20 20 20   Height:      Weight:      SpO2: 93% 96% 96% 97%    Intake/Output Summary (Last 24 hours) at 03/30/13 0732 Last data filed at 03/30/13 0700  Gross per 24 hour  Intake 1418.75 ml  Output    300 ml  Net 1118.75 ml    Exam: Gen:  NAD Cardiovascular:  Tachycardic, No M/R/G Respiratory:  Lungs diminished, tachypneic Gastrointestinal:  Abdomen soft, NT/ND, + BS Extremities:  No C/E/C  Data Reviewed: Basic Metabolic Panel:  Recent Labs Lab 03/28/13 0920 03/28/13 1554 03/29/13 0555 03/30/13 0600  NA 141  --  140 142  K 2.5*  --  2.5* 3.5*  CL 102  --  102 105  CO2 22  --  21 18*  GLUCOSE 116*  --  113* 109*  BUN 6  --  5* 6  CREATININE 0.62  --  0.59 0.58  CALCIUM 8.1*  --  7.4* 7.7*  MG  --  1.8   --   --    GFR Estimated Creatinine Clearance: 106.5 ml/min (by C-G formula based on Cr of 0.58). Liver Function Tests:  Recent Labs Lab 03/28/13 0920  AST 15  ALT 6  ALKPHOS 272*  BILITOT 0.9  PROT 6.7  ALBUMIN 3.0*   CBC:  Recent Labs Lab 03/28/13 0920 03/29/13 0825 03/30/13 0600  WBC 4.8 7.5 7.2  NEUTROABS 3.5  --   --   HGB 8.9* 4.5* 6.8*  HCT 28.1* 14.6* 20.4*  MCV 81.4 81.1 79.4  PLT 132* 145* 134*   Microbiology Recent Results (from the past 240 hour(s))  URINE CULTURE     Status: None   Collection Time    03/28/13  9:29 AM      Result Value Range Status   Specimen Description URINE, CLEAN CATCH   Final   Special Requests NONE   Final   Culture  Setup Time     Final   Value: 03/28/2013 16:09     Performed at [facility]     Final   Value: >=100,000 COLONIES/ML     Performed at [facility]   Culture     Final   Value: [place]     Performed at [facility]   Report Status PENDING   Incomplete     Procedures and Diagnostic Studies: Dg Chest 2 View  03/28/2013   CLINICAL DATA:  Fever and dyspnea, history of tobacco use, known widespread bony metastatic disease  EXAM: CHEST  2 VIEW  COMPARISON:  Portable chest x-ray of April 26, 2012.  FINDINGS: The lungs are mildly hypoinflated. There is no discrete alveolar infiltrate. The cardiopericardial silhouette is enlarged. The pulmonary interstitial markings are mildly prominent though stable. There is no pleural effusion. Scleroses of the bony structures diffusely is consistent with known progressive bony metastatic disease. There are loops of mildly distended small bowel in the left upper quadrant of the abdomen.  IMPRESSION: 1. The study is limited due to hypo inflation. There may be low-grade compensated CHF. There is no alveolar pneumonia nor significant pleural effusion. 2. The appearance of the skeleton is consistent with widespread metastatic disease which  appears to have progressed since the previous study 3. There are loops of mildly distended small bowel in the left upper quadrant of the abdomen which are nonspecific.   Electronically Signed   By: David  Martinique   On: 03/28/2013 10:04    Scheduled Meds: . abiraterone Acetate  1,000 mg Oral Daily  . cefTRIAXone (ROCEPHIN)  IV  1 g Intravenous Q24H  . potassium chloride  20 mEq Oral BID   Continuous Infusions: . 0.9 % NaCl with KCl 40 mEq / L 75 mL/hr at 03/30/13 0201    Time spent: 35 minutes The patient requires high complexity decision making.    LOS: 2 days   Sherlin Sonier  [facility] Pager [phone].   *Please note that the hospitalists switch teams on Wednesdays. Please call the flow manager at [phone] if you are having difficulty reaching the hospitalist taking care of this patient  as she can update you and provide the most up-to-date pager number of provider caring for the patient. If 8PM-8AM, please contact night-coverage at [email or web address], password [facility]  03/30/2013, 7:32 AM

## 2013-03-31 DIAGNOSIS — E43 Unspecified severe protein-calorie malnutrition: Secondary | ICD-10-CM

## 2013-03-31 LAB — CBC
HCT: 23.4 % — ABNORMAL LOW (ref 39.0–52.0)
Hemoglobin: 7.9 g/dL — ABNORMAL LOW (ref 13.0–17.0)
MCH: 27.1 pg (ref 26.0–34.0)
MCHC: 33.8 g/dL (ref 30.0–36.0)
MCV: 80.4 fL (ref 78.0–100.0)
Platelets: 139 10*3/uL — ABNORMAL LOW (ref 150–400)
RBC: 2.91 MIL/uL — AB (ref 4.22–5.81)
RDW: 17.8 % — AB (ref 11.5–15.5)
WBC: 6.4 10*3/uL (ref 4.0–10.5)

## 2013-03-31 LAB — TYPE AND SCREEN
ABO/RH(D): A POS
Antibody Screen: NEGATIVE
UNIT DIVISION: 0
UNIT DIVISION: 0
Unit division: 0
Unit division: 0

## 2013-03-31 LAB — BASIC METABOLIC PANEL
BUN: 6 mg/dL (ref 6–23)
CO2: 22 mEq/L (ref 19–32)
Calcium: 7.6 mg/dL — ABNORMAL LOW (ref 8.4–10.5)
Chloride: 106 mEq/L (ref 96–112)
Creatinine, Ser: 0.52 mg/dL (ref 0.50–1.35)
GFR calc Af Amer: >90 mL/min (ref >90)
Glucose, Bld: 110 mg/dL — ABNORMAL HIGH (ref 70–99)
Potassium: 3 mEq/L — ABNORMAL LOW (ref 3.7–5.3)
SODIUM: 143 meq/L (ref 137–147)

## 2013-03-31 MED ORDER — POTASSIUM CHLORIDE CRYS ER 20 MEQ PO TBCR
20.0000 meq | EXTENDED_RELEASE_TABLET | Freq: Three times a day (TID) | ORAL | Status: DC
  Administered 2013-03-31 – 2013-04-01 (×3): 20 meq via ORAL
  Filled 2013-03-31 (×5): qty 1

## 2013-03-31 NOTE — Progress Notes (Signed)
Clinical Social Work  CSW met with patient at bedside. Patient reports he is not feeling well and has not been able to discuss SNF in further detail with roommate. CSW provided bed offers and reported that patient could review list over night. CSW agreeable to follow up tomorrow but patient reports he feels certain that he will return home at DC. CSW will continue to follow.  [place], [place] [phone]

## 2013-03-31 NOTE — Progress Notes (Signed)
[facility] PROGRESS NOTE   Stephen Stokes [id number] DOB: 10-22-1955 DOA: 03/28/2013 PCP: Elizabeth Palau, MD  Brief narrative: Stephen Stokes is an 58 y.o. male with a PMH of advanced metastatic, castrate resistant prostate cancer diagnosed 03/2010 status post TURP and bilateral orchiectomy, status post palliative radiation treatment to T10-S3 completed 03/25/12, had progression of disease on Xtandi, now on Zytiga, and chronic anemia requiring PRBCs x 2, the last one given approximately 2 weeks ago, who was admitted on 03/28/13 with a 2 day history of dyspnea, but no cough and worsening weakness of uncertain but longstanding duration. Upon initial evaluation in the ED, the patient was found to have a low-grade fever of 99.4, hypokalemia with a potassium of 2.5, anemia with a hemoglobin of 8.9, and evidence of a UTI on urinalysis. Chest x-ray did not show any obvious pneumonia, but did show some dilated small bowel loops of uncertain significance.  Assessment/Plan: Principal Problem:  Klebsiella Pneumoniae UTI (urinary tract infection)  Urinalysis consistent with UTI. Urine cultures grew K. Pneumoniae, Rocephin sensitive, which is what he was empirically placed on.  Can D/C home on Cipro. Active Problems:  Severe protein calorie malnutrition Seen by dietician 03/30/13.  Placed on nutritional supplements. Prostate cancer metastatic to multiple sites  Has advanced metastatic prostate cancer and is failing to thrive with weight loss and malnutrition.  Shortness of breath  Multifactorial with possible upper respiratory infection, anemia, electrolyte imbalances, deconditioning all contributory. Status post 3 units of PRBCs 03/29/13, and 1 unit 03/30/13. Lasix 40 mg IV x 1 yesterday.  Likely volume overloaded from 4 units of PRBCs. Anemia of chronic disease  Hemoglobin rose to 4.5--->6.8 g after 3 units of blood 03/29/13, then to 7.9 after an additional unit 03/30/13. Hypokalemia  Magnesium  okay. Potassium still low despite supplementation.  Increase supplementation dose to 20 mEq TID. Nausea and vomiting / diarrhea  Hydrating and providing anti-emetics when necessary. Loops of mildly distended small bowel noted in the left upper quadrant on chest x-ray of uncertain significance. Lomotil PRN.  Symptoms improved. Generalized weakness  Multifactorial with advanced metastatic cancer, anemia of chronic disease and electrolyte imbalances or contributory. PT evaluation. May need SNF for deconditioning, but has not agreed to this.  Increase mobilization.   Code Status: Full.  Family Communication: All of his children live in [place]. Lists Rae Halsted as his emergency contact [phone]).  Disposition Plan: Home vs SNF if agreeable.   IV access:  Peripheral IV  Medical Consultants:  None  Other Consultants:  Dietitian  Anti-infectives:  Rocephin 03/28/13--->  HPI/Subjective: Stephen Stokes continues to feel weak, but less dyspneic. No nausea or vomiting.  Less fatigued, and feels much better than he did yesterday.  Objective: Filed Vitals:   03/30/13 1037 03/30/13 1315 03/30/13 1546 03/31/13 0615  BP: 121/87  98/65 127/90  Pulse: 105 110 118 124  Temp: 98.3 F (36.8 C)  100 F (37.8 C) 98.9 F (37.2 C)  TempSrc: Oral  Oral Oral  Resp: 18  18 20   Height:      Weight:      SpO2: 98% 93% 98% 97%    Intake/Output Summary (Last 24 hours) at 03/31/13 1106 Last data filed at 03/31/13 0825  Gross per 24 hour  Intake 1276.33 ml  Output   2050 ml  Net -773.67 ml    Exam: Gen:  NAD Cardiovascular:  Tachycardic, No M/R/G Respiratory:  Lungs diminished, tachypneic Gastrointestinal:  Abdomen soft, NT/ND, + BS Extremities:  No C/E/C  Data Reviewed: Basic Metabolic Panel:  Recent Labs Lab 03/28/13 0920 03/28/13 1554 03/29/13 0555 03/30/13 0600 03/31/13 0532  NA 141  --  140 142 143  K 2.5*  --  2.5* 3.5* 3.0*  CL 102  --  102 105 106  CO2 22  --  21 18* 22   GLUCOSE 116*  --  113* 109* 110*  BUN 6  --  5* 6 6  CREATININE 0.62  --  0.59 0.58 0.52  CALCIUM 8.1*  --  7.4* 7.7* 7.6*  MG  --  1.8  --   --   --    GFR Estimated Creatinine Clearance: 106.5 ml/min (by C-G formula based on Cr of 0.52). Liver Function Tests:  Recent Labs Lab 03/28/13 0920  AST 15  ALT 6  ALKPHOS 272*  BILITOT 0.9  PROT 6.7  ALBUMIN 3.0*   CBC:  Recent Labs Lab 03/28/13 0920 03/29/13 0825 03/30/13 0600 03/31/13 0532  WBC 4.8 7.5 7.2 6.4  NEUTROABS 3.5  --   --   --   HGB 8.9* 4.5* 6.8* 7.9*  HCT 28.1* 14.6* 20.4* 23.4*  MCV 81.4 81.1 79.4 80.4  PLT 132* 145* 134* 139*   Microbiology Recent Results (from the past 240 hour(s))  URINE CULTURE     Status: None   Collection Time    03/28/13  9:29 AM      Result Value Range Status   Specimen Description URINE, CLEAN CATCH   Final   Special Requests NONE   Final   Culture  Setup Time     Final   Value: 03/28/2013 16:09     Performed at [facility]     Final   Value: >=100,000 COLONIES/ML     Performed at [facility]   Culture     Final   Value: KLEBSIELLA PNEUMONIAE     Performed at [facility]   Report Status 03/30/2013 FINAL   Final   Organism ID, Bacteria KLEBSIELLA PNEUMONIAE   Final     Procedures and Diagnostic Studies: Dg Chest 2 View  03/28/2013   CLINICAL DATA:  Fever and dyspnea, history of tobacco use, known widespread bony metastatic disease  EXAM: CHEST  2 VIEW  COMPARISON:  Portable chest x-ray of April 26, 2012.  FINDINGS: The lungs are mildly hypoinflated. There is no discrete alveolar infiltrate. The cardiopericardial silhouette is enlarged. The pulmonary interstitial markings are mildly prominent though stable. There is no pleural effusion. Scleroses of the bony structures diffusely is consistent with known progressive bony metastatic disease. There are loops of mildly distended small bowel in the left upper quadrant of the abdomen.   IMPRESSION: 1. The study is limited due to hypo inflation. There may be low-grade compensated CHF. There is no alveolar pneumonia nor significant pleural effusion. 2. The appearance of the skeleton is consistent with widespread metastatic disease which appears to have progressed since the previous study 3. There are loops of mildly distended small bowel in the left upper quadrant of the abdomen which are nonspecific.   Electronically Signed   By: David  Martinique   On: 03/28/2013 10:04    Scheduled Meds: . abiraterone Acetate  1,000 mg Oral Daily  . cefTRIAXone (ROCEPHIN)  IV  1 g Intravenous Q24H  . feeding supplement (ENSURE COMPLETE)  237 mL Oral TID BM  . potassium chloride  20 mEq Oral BID   Continuous Infusions: . 0.9 % NaCl with KCl  40 mEq / L 10 mL/hr (03/30/13 1051)    Time spent: 25 minutes.    LOS: 3 days   [facility] Pager [phone].   *Please note that the hospitalists switch teams on Wednesdays. Please call the flow manager at [phone] if you are having difficulty reaching the hospitalist taking care of this patient as she can update you and provide the most up-to-date pager number of provider caring for the patient. If 8PM-8AM, please contact night-coverage at [email or web address], password [facility]  03/31/2013, 11:06 AM

## 2013-04-01 DIAGNOSIS — R072 Precordial pain: Secondary | ICD-10-CM

## 2013-04-01 LAB — BASIC METABOLIC PANEL
BUN: 6 mg/dL (ref 6–23)
CALCIUM: 8.2 mg/dL — AB (ref 8.4–10.5)
CO2: 21 meq/L (ref 19–32)
Chloride: 102 mEq/L (ref 96–112)
Creatinine, Ser: 0.52 mg/dL (ref 0.50–1.35)
GFR calc non Af Amer: >90 mL/min (ref >90)
Glucose, Bld: 127 mg/dL — ABNORMAL HIGH (ref 70–99)
Potassium: 3 mEq/L — ABNORMAL LOW (ref 3.7–5.3)
SODIUM: 139 meq/L (ref 137–147)

## 2013-04-01 LAB — CBC
HCT: 24.2 % — ABNORMAL LOW (ref 39.0–52.0)
Hemoglobin: 7.9 g/dL — ABNORMAL LOW (ref 13.0–17.0)
MCH: 26.3 pg (ref 26.0–34.0)
MCHC: 32.6 g/dL (ref 30.0–36.0)
MCV: 80.7 fL (ref 78.0–100.0)
Platelets: 152 10*3/uL (ref 150–400)
RBC: 3 MIL/uL — AB (ref 4.22–5.81)
RDW: 17.8 % — ABNORMAL HIGH (ref 11.5–15.5)
WBC: 6.6 10*3/uL (ref 4.0–10.5)

## 2013-04-01 MED ORDER — POTASSIUM CHLORIDE CRYS ER 20 MEQ PO TBCR: 20.0000 meq | EXTENDED_RELEASE_TABLET | Freq: Two times a day (BID) | ORAL | Status: AC

## 2013-04-01 MED ORDER — CIPROFLOXACIN HCL 500 MG PO TABS: 500.0000 mg | ORAL_TABLET | Freq: Two times a day (BID) | ORAL | Status: AC

## 2013-04-01 MED ORDER — DIPHENOXYLATE-ATROPINE 2.5-0.025 MG/5ML PO LIQD: 10.0000 mL | Freq: Four times a day (QID) | ORAL | Status: AC | PRN

## 2013-04-01 MED ORDER — POTASSIUM CHLORIDE CRYS ER 20 MEQ PO TBCR
40.0000 meq | EXTENDED_RELEASE_TABLET | Freq: Two times a day (BID) | ORAL | Status: DC
  Filled 2013-04-01 (×2): qty 2

## 2013-04-01 MED ORDER — HYDROMORPHONE HCL 4 MG PO TABS: 4.0000 mg | ORAL_TABLET | ORAL | Status: AC | PRN

## 2013-04-01 NOTE — Progress Notes (Signed)
Clinical Social Work  CSW met with patient at bedside. Patient reports he spoke with [facility] yesterday and has decided that he wants to go home at DC. CSW explained possible DC soon and asked if patient felt he would be safe to return home. Patient reports he is deconditioned from hospital stay but feels he can manage at home. Patient reports that he is interested in [facility] but will not go to SNF. CSW made MD and CM aware of patient's decision.  CSW is signing off but available if further needs arise.  [place], [place] [phone]

## 2013-04-01 NOTE — Care Management Note (Signed)
    Page 1 of 1   04/01/2013     11:44:28 AM   CARE MANAGEMENT NOTE 04/01/2013  Patient:  Stephen Stokes,Stephen Stokes   Account Number:  [id number]  Date Initiated:  04/01/2013  Documentation initiated by:  [facility]  Subjective/Objective Assessment:   58 year old male with advanced metastatic advanced prostate cancer admitted with weakness and intermittent dyspnea.     Action/Plan:   From home, lives with a friend. PT was recommending SNF but he feels he can manage at home.   Anticipated DC Date:  04/01/2013   Anticipated DC Plan:  HOME/SELF CARE  In-house referral  Clinical Social Worker      DC Planning Services  CM consult      Choice offered to / List presented to:             Status of service:   Medicare Important Message given?   (If response is "NO", the following Medicare IM given date fields will be blank) Date Medicare IM given:   Date Additional Medicare IM given:    Discharge Disposition:    Per UR Regulation:    If discussed at Long Length of Stay Meetings, dates discussed:    Comments:  04/01/13 [facility] RN BSN I met with pt at bedside to discuss d/c planning. He lives with a friend that works 80:30p-12:30a. He is alone during these hours. PT is recommending SNF for him but he declines and he fels he can manage at home. I discussed [facility] services with him and he stated he wants to see how he does when he gets home and if he decides to have the services set up he will call  his oncologist Dr Alen Blew and get them ordered.

## 2013-04-01 NOTE — Discharge Instructions (Signed)
Urinary Tract Infection  Urinary tract infections (UTIs) can develop anywhere along your urinary tract. Your urinary tract is your body's drainage system for removing wastes and extra water. Your urinary tract includes two kidneys, two ureters, a bladder, and a urethra. Your kidneys are a pair of bean-shaped organs. Each kidney is about the size of your fist. They are located below your ribs, one on each side of your spine.  CAUSES  Infections are caused by microbes, which are microscopic organisms, including fungi, viruses, and bacteria. These organisms are so small that they can only be seen through a microscope. Bacteria are the microbes that most commonly cause UTIs.  SYMPTOMS   Symptoms of UTIs may vary by age and gender of the patient and by the location of the infection. Symptoms in young women typically include a frequent and intense urge to urinate and a painful, burning feeling in the bladder or urethra during urination. Older women and men are more likely to be tired, shaky, and weak and have muscle aches and abdominal pain. A fever may mean the infection is in your kidneys. Other symptoms of a kidney infection include pain in your back or sides below the ribs, nausea, and vomiting.  DIAGNOSIS  To diagnose a UTI, your caregiver will ask you about your symptoms. Your caregiver also will ask to provide a urine sample. The urine sample will be tested for bacteria and white blood cells. White blood cells are made by your body to help fight infection.  TREATMENT   Typically, UTIs can be treated with medication. Because most UTIs are caused by a bacterial infection, they usually can be treated with the use of antibiotics. The choice of antibiotic and length of treatment depend on your symptoms and the type of bacteria causing your infection.  HOME CARE INSTRUCTIONS   If you were prescribed antibiotics, take them exactly as your caregiver instructs you. Finish the medication even if you feel better after you  have only taken some of the medication.   Drink enough water and fluids to keep your urine clear or pale yellow.   Avoid caffeine, tea, and carbonated beverages. They tend to irritate your bladder.   Empty your bladder often. Avoid holding urine for long periods of time.   Empty your bladder before and after sexual intercourse.   After a bowel movement, women should cleanse from front to back. Use each tissue only once.  SEEK MEDICAL CARE IF:    You have back pain.   You develop a fever.   Your symptoms do not begin to resolve within 3 days.  SEEK IMMEDIATE MEDICAL CARE IF:    You have severe back pain or lower abdominal pain.   You develop chills.   You have nausea or vomiting.   You have continued burning or discomfort with urination.  MAKE SURE YOU:    Understand these instructions.   Will watch your condition.   Will get help right away if you are not doing well or get worse.  Document Released: 12/06/2004 Document Revised: 08/28/2011 Document Reviewed: 04/06/2011  ExitCare Patient Information 2014 [employer].

## 2013-04-01 NOTE — Discharge Summary (Signed)
Physician Discharge Summary  Sha Amer DGU:[id number] DOB: 1955-07-22 DOA: 03/28/2013  PCP: Elizabeth Palau, MD  Admit date: 03/28/2013 Discharge date: 04/01/2013  Recommendations for Outpatient Follow-up:  1. Pt will need to follow up with PCP in 2-3 weeks post discharge 2. Please obtain BMP to evaluate electrolytes and kidney function 3. Please also check CBC to evaluate Hg and Hct levels 4. Please note that pt was discharged on cipro x 7 days 5. Given kdur 37 mew prior to discharge   Discharge Diagnoses: UTI, Klebsiella  Principal Problem:   UTI (urinary tract infection) Active Problems:   Prostate cancer metastatic to multiple sites   Shortness of breath   Anemia of chronic disease   Hypokalemia   Nausea and vomiting   Generalized weakness   UTI (lower urinary tract infection)   Diarrhea   Protein-calorie malnutrition, severe  Discharge Condition: Stable  Diet recommendation: Heart healthy diet discussed in details   Brief narrative:  Stephen Stokes is an 58 y.o. male with a PMH of advanced metastatic, castrate resistant prostate cancer diagnosed 03/2010 status post TURP and bilateral orchiectomy, status post palliative radiation treatment to T10-S3 completed 03/25/12, had progression of disease on Xtandi, now on Zytiga, and chronic anemia requiring PRBCs x 2, the last one given approximately 2 weeks ago, who was admitted on 03/28/13 with a 2 day history of dyspnea, but no cough and worsening weakness of uncertain but longstanding duration. Upon initial evaluation in the ED, the patient was found to have a low-grade fever of 99.4, hypokalemia with a potassium of 2.5, anemia with a hemoglobin of 8.9, and evidence of a UTI on urinalysis. Chest x-ray did not show any obvious pneumonia, but did show some dilated small bowel loops of uncertain significance.   Assessment/Plan:  Principal Problem:  Klebsiella Pneumoniae UTI (urinary tract infection)  Urinalysis consistent with  UTI. Urine cultures grew K. Pneumoniae, Rocephin sensitive, which is what he was empirically placed on. Can D/C home on Cipro. Continue for 7 more days post discharge.  Active Problems:  Severe protein calorie malnutrition  Seen by dietician 03/30/13. Placed on nutritional supplements.  Prostate cancer metastatic to multiple sites  Has advanced metastatic prostate cancer and is failing to thrive with weight loss and malnutrition.  Shortness of breath  Multifactorial with possible upper respiratory infection, anemia, electrolyte imbalances, deconditioning all contributory. Status post 3 units of PRBCs 03/29/13, and 1 unit 03/30/13. Likely volume overloaded from 4 units of PRBCs. This has now resolved, pt denies SOB this AM. Wants to be discharged. Anemia of chronic disease  Hemoglobin rose to 4.5--->6.8 g after 3 units of blood 03/29/13, then to 7.9 after an additional unit 03/30/13.  Hypokalemia  Magnesium okay. Potassium still low despite supplementation. Increase supplementation dose to 20 mEq BID. Please note that pt was given two doses of K-dur 40 mEq prior to discharge.  Nausea and vomiting / diarrhea  Hydrating and providing anti-emetics when necessary. Loops of mildly distended small bowel noted in the left upper quadrant on chest x-ray of uncertain significance. Lomotil PRN. Symptoms improved.  Generalized weakness  Multifactorial with advanced metastatic cancer, anemia of chronic disease and electrolyte imbalances or contributory. PT evaluation. May need SNF for deconditioning, but has not agreed to this. Increase mobilization.   Code Status: Full.  Family Communication: All of his children live in [place].   IV access:  Peripheral IV Medical Consultants:  None Other Consultants:  Dietitian Anti-infectives:  Rocephin 03/28/13 ---> 01/21 Cipro 01/21 --> 7  more days post discharge   Discharge Exam: Filed Vitals:   04/01/13 0629  BP: 134/85  Pulse: 105  Temp: 98.8 F (37.1 C)  Resp:  18   Filed Vitals:   03/31/13 0615 03/31/13 1345 03/31/13 2100 04/01/13 0629  BP: 127/90 122/87 112/67 134/85  Pulse: 124 100 100 105  Temp: 98.9 F (37.2 C) 100.1 F (37.8 C) 98.6 F (37 C) 98.8 F (37.1 C)  TempSrc: Oral Axillary Oral Oral  Resp: 20 16 20 18   Height:      Weight:      SpO2: 97% 97% 100% 10%    General: Pt is alert, follows commands appropriately, not in acute distress Cardiovascular: Regular rate and rhythm, S1/S2 +, no murmurs, no rubs, no gallops Respiratory: Clear to auscultation bilaterally, no wheezing, no crackles, no rhonchi Abdominal: Soft, non tender, non distended, bowel sounds +, no guarding Extremities: no edema, no cyanosis, pulses palpable bilaterally DP and PT Neuro: Grossly nonfocal  Discharge Instructions  Discharge Orders   Future Appointments Provider Department Dept Phone   04/08/2013 10:15 AM [facility] [place] [phone]   04/08/2013 10:45 AM Wyatt Portela, MD [facility] [phone]   04/08/2013 11:45 AM [facility] [phone]   Future Orders Complete By Expires   Diet - low sodium heart healthy  As directed    Increase activity slowly  As directed        Medication List         acetaminophen 500 MG tablet  Commonly known as:  TYLENOL  Take 500 mg by mouth every 6 (six) hours as needed for mild pain or headache.     ciprofloxacin 500 MG tablet  Commonly known as:  CIPRO  Take 1 tablet (500 mg total) by mouth 2 (two) times daily.     diphenoxylate-atropine 2.5-0.025 MG/5ML liquid  Commonly known as:  LOMOTIL  Take 10 mLs by mouth 4 (four) times daily as needed for diarrhea or loose stools.     HYDROmorphone 4 MG tablet  Commonly known as:  DILAUDID  Take 1 tablet (4 mg total) by mouth every 4 (four) hours as needed for severe pain.     ibuprofen 200 MG tablet  Commonly known as:  ADVIL,MOTRIN   Take 200 mg by mouth every 6 (six) hours as needed for mild pain.     potassium chloride SA 20 MEQ tablet  Commonly known as:  K-DUR,KLOR-CON  Take 1 tablet (20 mEq total) by mouth 2 (two) times daily.     ZYTIGA 250 MG tablet  Generic drug:  abiraterone Acetate  Take 1,000 mg by mouth daily. Take on an empty stomach 1 hour before or 2 hours after a meal           Follow-up Information   Follow up with Elizabeth Palau, MD In 2 weeks.   Specialty:  Family Medicine   Contact information:   [address] [place] [address] [phone]       Follow up with Faye Ramsay, MD. (As needed if symptoms worsen call my cell phone [phone])    Specialty:  Internal Medicine   Contact information:   [address] [place] [address] [phone]        The results of significant diagnostics from this hospitalization (including imaging, microbiology, ancillary and laboratory) are listed below for reference.  Microbiology: Recent Results (from the past 240 hour(s))  URINE CULTURE     Status: None   Collection Time    03/28/13  9:29 AM      Result Value Range Status   Specimen Description URINE, CLEAN CATCH   Final   Special Requests NONE   Final   Culture  Setup Time     Final   Value: 03/28/2013 16:09     Performed at [facility]     Final   Value: >=100,000 COLONIES/ML     Performed at [facility]   Culture     Final   Value: KLEBSIELLA PNEUMONIAE     Performed at [facility]   Report Status 03/30/2013 FINAL   Final   Organism ID, Bacteria KLEBSIELLA PNEUMONIAE   Final     Labs: Basic Metabolic Panel:  Recent Labs Lab 03/28/13 0920 03/28/13 1554 03/29/13 0555 03/30/13 0600 03/31/13 0532 04/01/13 0540  NA 141  --  140 142 143 139  K 2.5*  --  2.5* 3.5* 3.0* 3.0*  CL 102  --  102 105 106 102  CO2 22  --  21 18* 22 21  GLUCOSE 116*  --  113* 109* 110* 127*  BUN 6  --  5* 6 6  6   CREATININE 0.62  --  0.59 0.58 0.52 0.52  CALCIUM 8.1*  --  7.4* 7.7* 7.6* 8.2*  MG  --  1.8  --   --   --   --    Liver Function Tests:  Recent Labs Lab 03/28/13 0920  AST 15  ALT 6  ALKPHOS 272*  BILITOT 0.9  PROT 6.7  ALBUMIN 3.0*   CBC:  Recent Labs Lab 03/28/13 0920 03/29/13 0825 03/30/13 0600 03/31/13 0532 04/01/13 0540  WBC 4.8 7.5 7.2 6.4 6.6  NEUTROABS 3.5  --   --   --   --   HGB 8.9* 4.5* 6.8* 7.9* 7.9*  HCT 28.1* 14.6* 20.4* 23.4* 24.2*  MCV 81.4 81.1 79.4 80.4 80.7  PLT 132* 145* 134* 139* 152   SIGNED: Time coordinating discharge: Over 30 minutes  Faye Ramsay, MD  [facility] 04/01/2013, 11:09 AM Pager [phone]  If 7PM-7AM, please contact night-coverage [email or web address] Password [email or web address]

## 2013-04-02 ENCOUNTER — Ambulatory Visit: Payer: Medicare Other

## 2013-04-02 ENCOUNTER — Ambulatory Visit: Admission: RE | Admit: 2013-04-02 | Payer: Medicare Other | Source: Ambulatory Visit | Admitting: Radiation Oncology

## 2013-04-06 ENCOUNTER — Telehealth: Payer: Self-pay | Admitting: *Deleted

## 2013-04-06 NOTE — Telephone Encounter (Signed)
Friend calling to say Stephen Stokes is weak and his ankles are swollen. Instructed to keep lower extremities elevated, if they become painful or he has difficulty walking, he is to call this office. Encouraged him to keep regularly scheduled appt with dr Alen Blew on 04/08/13.note to dr Hazeline Junker desk

## 2013-04-07 ENCOUNTER — Encounter: Payer: Self-pay | Admitting: *Deleted

## 2013-04-07 DIAGNOSIS — C419 Malignant neoplasm of bone and articular cartilage, unspecified: Secondary | ICD-10-CM | POA: Insufficient documentation

## 2013-04-07 NOTE — Progress Notes (Signed)
Histology and Location of Primary Cancer: castration -resistant prostate cancer, now wide spread skeleton metastses  Sites of Visceral and Bony Metastatic Disease: axillary and appendicular skeleton   Location(s) of Symptomatic Metastases:  Past/Anticipated chemotherapy by medical oncology, if any: Zytiga daily started last week September 2014, lab 04/08/13 and Dr.Shadad appt   Possible   Palliative XRT or Xofigo   Pain on a scale of 0-10 is:6 "hurts all over but greater from waist down.    If Spine Met(s), symptoms, if any, include:  Bowel/Bladder retention or incontinence (please describe): no bowel movement in 2 to 3 days.  Numbness or weakness in extremities (please describe): weakness sob, swelling lower extremities.  Current Decadron regimen, if applicable: no  Ambulatory status uses walker at home and needs wheelchair here in center.  SAFETY ISSUES: yes  Prior radiation? Yes  T-10-S-3  Completed 03/25/12  Pacemaker/ICD? no    Is the patient on methotrexate? NO  Current Complaints / other details: 03/2010 s/p TURP, & b/l orchiectomy, last PSA 02/27/13= 1033 [place] admitted with shortness of breath and low hemoglobin 4.8  requiring 4 units of packed red blood cells.

## 2013-04-08 ENCOUNTER — Other Ambulatory Visit: Payer: Medicare Other

## 2013-04-08 ENCOUNTER — Ambulatory Visit: Payer: Medicare Other | Admitting: Oncology

## 2013-04-08 ENCOUNTER — Telehealth: Payer: Self-pay | Admitting: *Deleted

## 2013-04-08 NOTE — Telephone Encounter (Signed)
Patient's room mate Rae Halsted, calling to say he is unable to get ronelle out of the bed to come to his appt with dr Alen Blew today. States he is very weak and not eating a lot. Instructed him that patient needs to be seen and he should get him to the  E.R. Dr Alen Blew notified.

## 2013-04-09 ENCOUNTER — Ambulatory Visit
Admission: RE | Admit: 2013-04-09 | Discharge: 2013-04-09 | Disposition: A | Discharge status: Home / Self Care | Payer: Medicare Other | Source: Ambulatory Visit | Attending: Radiation Oncology | Admitting: Radiation Oncology

## 2013-04-09 VITALS — BP 115/87 | HR 114 | Temp 99.8°F

## 2013-04-09 DIAGNOSIS — C7951 Secondary malignant neoplasm of bone: Secondary | ICD-10-CM | POA: Insufficient documentation

## 2013-04-09 DIAGNOSIS — Z87891 Personal history of nicotine dependence: Secondary | ICD-10-CM | POA: Insufficient documentation

## 2013-04-09 DIAGNOSIS — C7952 Secondary malignant neoplasm of bone marrow: Secondary | ICD-10-CM

## 2013-04-09 DIAGNOSIS — Z79899 Other long term (current) drug therapy: Secondary | ICD-10-CM | POA: Insufficient documentation

## 2013-04-09 DIAGNOSIS — C61 Malignant neoplasm of prostate: Secondary | ICD-10-CM | POA: Insufficient documentation

## 2013-04-09 NOTE — Progress Notes (Signed)
Please see the Nurse Progress Note in the MD Initial Consult Encounter for this patient. 

## 2013-04-09 NOTE — Addendum Note (Signed)
Encounter addended by: Marye Round, MD on: 04/09/2013 12:37 PM<BR>     Documentation filed: Arn Medal VN

## 2013-04-09 NOTE — Progress Notes (Signed)
Radiation Oncology         [phone] ________________________________  Name: Stephen Stokes MRN: [id number]  Date: 04/09/2013  DOB: [birth year]  Follow-Up Visit Note  CC: Elizabeth Palau, MD  Wyatt Portela, MD  Diagnosis:   Metastatic prostate cancer with bony metastases  Interval Since Last Radiation:   The patient completed radiation treatment to 30 gray to the lumbosacral spine on 03/25/2012   Narrative:  The patient returns today for routine follow-up.  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 We discussed    proceeding with a bone scan. This was not completed for some reason but the patient now complains of worsening pain over a broader area. His pain when he was last seen was somewhat focal in the hip region. He is now complaining of widespread pain in the hips and lower extremities as well as significant pain in the back which has worsened. The patient was scheduled to see medical oncology yesterday but this was changed. This is being rescheduled.                           ALLERGIES:  is allergic to potassium-containing compounds.  Meds: Current Outpatient Prescriptions  Medication Sig Dispense Refill  . abiraterone Acetate (ZYTIGA) 250 MG tablet Take 1,000 mg by mouth daily. Take on an empty stomach 1 hour before or 2 hours after a meal      . acetaminophen (TYLENOL) 500 MG tablet Take 500 mg by mouth every 6 (six) hours as needed for mild pain or headache.      . diphenoxylate-atropine (LOMOTIL) 2.5-0.025 MG/5ML liquid Take 10 mLs by mouth 4 (four) times daily as needed for diarrhea or loose stools.  60 mL  3  . HYDROmorphone (DILAUDID) 4 MG tablet Take 1 tablet (4 mg total) by mouth every 4 (four) hours as needed for severe pain.  60 tablet  0  . ibuprofen (ADVIL,MOTRIN) 200 MG tablet Take 200 mg by mouth every 6 (six) hours as needed for mild pain.      . ciprofloxacin  (CIPRO) 500 MG tablet Take 1 tablet (500 mg total) by mouth 2 (two) times daily.  14 tablet  0  . potassium chloride SA (K-DUR,KLOR-CON) 20 MEQ tablet Take 1 tablet (20 mEq total) by mouth 2 (two) times daily.  60 tablet  0   No current facility-administered medications for this encounter.    Physical Findings: The patient is in no acute distress. Patient is alert and oriented.  temperature is 99.8 F (37.7 C). His blood pressure is 115/87 and his pulse is 114. His oxygen saturation is 100%. .     Lab Findings: Lab Results  Component Value Date   WBC 6.6 04/01/2013   HGB 7.9* 04/01/2013   HCT 24.2* 04/01/2013   MCV 80.7 04/01/2013   PLT 152 04/01/2013     Radiographic Findings: Dg Chest 2 View  03/28/2013   CLINICAL DATA:  Fever and dyspnea, history of tobacco use, known widespread bony metastatic disease  EXAM: CHEST  2 VIEW  COMPARISON:  Portable chest x-ray of April 26, 2012.  FINDINGS: The lungs are mildly hypoinflated. There is no discrete alveolar infiltrate. The cardiopericardial silhouette is enlarged. The pulmonary interstitial markings are mildly prominent though stable. There is no pleural effusion. Scleroses of the bony structures diffusely is consistent with known progressive bony metastatic disease. There are  loops of mildly distended small bowel in the left upper quadrant of the abdomen.  IMPRESSION: 1. The study is limited due to hypo inflation. There may be low-grade compensated CHF. There is no alveolar pneumonia nor significant pleural effusion. 2. The appearance of the skeleton is consistent with widespread metastatic disease which appears to have progressed since the previous study 3. There are loops of mildly distended small bowel in the left upper quadrant of the abdomen which are nonspecific.   Electronically Signed   By: David  Martinique   On: 03/28/2013 10:04    Impression:    The patient I believe it is appropriate for palliative  Treatment. Given his widespread  symptoms I discussed with him Xofigo. This would correspond to 6 injections separated by approximately 4 weeks. Blood work will be obtained prior to each injection. In reviewing his labs today, I believe he is appropriate to begin this treatment. He has been on Zytiga.  Plan:  We will coordinate with nuclear medicine beginning his treatment.  I spent 15 minutes with the patient today, the majority of which was spent counseling the patient on the diagnosis of cancer and coordinating care.   Jodelle Gross, M.D., Ph.D.

## 2013-04-09 NOTE — Addendum Note (Signed)
Encounter addended by: Ramiro Pangilinan Marie Yuval Nolet, RN on: 04/09/2013  1:35 PM<BR>     Documentation filed: Charges VN

## 2013-04-10 NOTE — Addendum Note (Signed)
Encounter addended by: Rebecca Eaton, RN on: 04/10/2013 11:03 AM<BR>     Documentation filed: Charges VN

## 2013-04-14 ENCOUNTER — Emergency Department (HOSPITAL_COMMUNITY)
Admission: EM | Admit: 2013-04-14 | Discharge: 2013-05-10 | Disposition: E | Payer: Medicare Other | Attending: Emergency Medicine | Admitting: Emergency Medicine

## 2013-04-14 ENCOUNTER — Encounter (HOSPITAL_COMMUNITY): Payer: Self-pay | Admitting: Emergency Medicine

## 2013-04-14 DIAGNOSIS — C7951 Secondary malignant neoplasm of bone: Secondary | ICD-10-CM | POA: Insufficient documentation

## 2013-04-14 DIAGNOSIS — Z79899 Other long term (current) drug therapy: Secondary | ICD-10-CM | POA: Insufficient documentation

## 2013-04-14 DIAGNOSIS — I10 Essential (primary) hypertension: Secondary | ICD-10-CM | POA: Insufficient documentation

## 2013-04-14 DIAGNOSIS — C7952 Secondary malignant neoplasm of bone marrow: Secondary | ICD-10-CM

## 2013-04-14 DIAGNOSIS — F17200 Nicotine dependence, unspecified, uncomplicated: Secondary | ICD-10-CM | POA: Insufficient documentation

## 2013-04-14 DIAGNOSIS — E785 Hyperlipidemia, unspecified: Secondary | ICD-10-CM | POA: Insufficient documentation

## 2013-04-14 DIAGNOSIS — C61 Malignant neoplasm of prostate: Secondary | ICD-10-CM | POA: Insufficient documentation

## 2013-04-14 DIAGNOSIS — Z8719 Personal history of other diseases of the digestive system: Secondary | ICD-10-CM | POA: Insufficient documentation

## 2013-04-14 DIAGNOSIS — I469 Cardiac arrest, cause unspecified: Secondary | ICD-10-CM

## 2013-05-10 NOTE — ED Provider Notes (Signed)
CSN: [id number]     Arrival date & time May 12, 2013  0905 History   None    Chief Complaint  Patient presents with  . Cardiac Arrest   (Consider location/radiation/quality/duration/timing/severity/associated sxs/prior Treatment) HPI  58 year old male brought in by EMS in extremis. Patient was found down at home by roommate shortly before arrival. Unknown downtime. Apparently roommate initially felt a pulse. Patient was pulseless on EMS arrival. Initial rhythm appeared asystole. CPR was initiated. Attempted electrocardioversion x2 for what appeared to be fine ventricular fibrillation. He received epinephrine x5, 1 amp of D50, and 4 mg of Narcan. He had interosseous line placed in his right tibia. Attempted intubation prehospital x4 without success. A lot of blood was noted in the oropharynx. He arrived to the resuscitation bay with a nasal trumpet and oral airway. Bagged and Stephen Stokes assist device.   Per quick review of records, hx of prostate cancer metastatic to multiple sites. Recent admit and transfused multiple units of PRBC for hemoglobin of 4.5.    Past Medical History  Diagnosis Date  . Hypertension   . Diverticulosis     Noted on CT abdomen (06/2010)  . Hyperlipidemia   . Elevated PSA 02/13/12    450.60  . 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 Gaynelle Stokes (06/2010) // CT abdomen and pelvis  (06/2010) revealed signficant retroperitoneal and pelvic adenopathy // Bone scan (06/2010) - showed diffuse osseous abnormality  . Hot flashes   . Anxiety     mild new dx  . Radiation 03/11/12-03/25/12    Palliative 30 gray to T10-S3 in 10 fractions  . Bone cancer     diffuse T-L spine mets   Past Surgical History  Procedure Laterality Date  . Orchiectomy  06/2010    bilateral  . Tonsillectomy    . Transurethral resection of prostate  06/2010    gleason 4+3=7   History reviewed. No pertinent  family history. History  Substance Use Topics  . Smoking status: Current Every Day Smoker -- 0.50 packs/day for 39 years    Types: Cigarettes  . Smokeless tobacco: Never Used     Comment: hx of 1 PPD  . Alcohol Use: Yes     Comment: occasional last 2 weeks ago, gin    Review of Systems  Level 5 caveat because patient is unresponsive   Allergies  Potassium-containing compounds  Home Medications   Current Outpatient Rx  Name  Route  Sig  Dispense  Refill  . abiraterone Acetate (ZYTIGA) 250 MG tablet   Oral   Take 1,000 mg by mouth daily. Take on an empty stomach 1 hour before or 2 hours after a meal         . acetaminophen (TYLENOL) 500 MG tablet   Oral   Take 500 mg by mouth every 6 (six) hours as needed for mild pain or headache.         . ciprofloxacin (CIPRO) 500 MG tablet   Oral   Take 1 tablet (500 mg total) by mouth 2 (two) times daily.   14 tablet   0   . diphenoxylate-atropine (LOMOTIL) 2.5-0.025 MG/5ML liquid   Oral   Take 10 mLs by mouth 4 (four) times daily as needed for diarrhea or loose stools.   60 mL   3   . HYDROmorphone (DILAUDID) 4 MG tablet   Oral   Take 1 tablet (4 mg  total) by mouth every 4 (four) hours as needed for severe pain.   60 tablet   0   . ibuprofen (ADVIL,MOTRIN) 200 MG tablet   Oral   Take 200 mg by mouth every 6 (six) hours as needed for mild pain.         . potassium chloride SA (K-DUR,KLOR-CON) 20 MEQ tablet   Oral   Take 1 tablet (20 mEq total) by mouth 2 (two) times daily.   60 tablet   0    Pulse 0  Resp 0 Physical Exam  Nursing note and vitals reviewed. Constitutional: He appears distressed.  HENT:  Head: Normocephalic and atraumatic.  Nasal trumpet. Oral airway. No blood noted at lips, mouth or on tongue. Bagged easily.   Eyes: Right eye exhibits no discharge. Left eye exhibits no discharge.  Neck: Neck supple.  Cardiovascular: Normal rate, regular rhythm and normal heart sounds.  Exam reveals no  gallop and no friction rub.   No murmur heard. Pulmonary/Chest: He is in respiratory distress.  Bagged. Coarse breath sounds b/l. No spontaneous respiratory effort.   Abdominal: Soft. He exhibits no distension. There is no tenderness.  Musculoskeletal: He exhibits no edema and no tenderness.  Neurological:  gcs 3    ED Course  Procedures (including critical care time)  Cardiopulmonary Resuscitation (CPR) Procedure Note Directed/Performed by: Stephen Stokes I personally directed ancillary staff and/or performed CPR in an effort to regain return of spontaneous circulation and to maintain cardiac, neuro and systemic perfusion.   Labs Review Labs Reviewed - No data to display Imaging Review No results found.  EKG Interpretation   None       MDM   1. Cardiopulmonary arrest   2. Prostate cancer metastatic to multiple sites     58 year old male with an unwitnessed arrest. Asystole on EMS arrival. CPR for approximately 20-25 minutes prior to arrival to the emergency room. Pulseless the entire time. He arrived with a GCS of 3. Pupils fixed and dilated. Reportedly very difficult intubation I willwith a lot of blood noted. No reported history of varices, but I was unable to confirm this with review of prior records. He was bagged easily with oral airway some place. Did not attempt further airway management because bedside ultrasound showed no cardiac motion. Dismal prognosis. CPR was discontinued. Time of death 0907. PCP listed as Stephen Stokes. Listed home number called without response. No family on scene per EMS.   9:51 AM Friend and roommate in ED. Apparently pt has no family that they have seen or are aware of. Pt originally from [place]. No next of kin that they have ever come in contact with.   10:36 AM Secretary from Stephen Stokes office called. Informed of patient's death.   Stephen Manifold, MD 2013-04-30 [phone]

## 2013-05-10 NOTE — Code Documentation (Signed)
Per Dr. Wilson Singer no cardiac activity identified. Time of death 0907.

## 2013-05-10 NOTE — Progress Notes (Signed)
Chaplain paged to provide ministry of presence. Patient expired. Chaplain provided emotional and grief support.   2013/05/06 1100  Clinical Encounter Type  Visited With Health care provider (Friends )  Visit Type Initial;Spiritual support;Death;ED  Referral From Nurse  Spiritual Encounters  Spiritual Needs Emotional;Grief support

## 2013-05-10 NOTE — ED Notes (Signed)
Chaplin paged, met family in triage, family taken to family room A

## 2013-05-10 NOTE — ED Notes (Signed)
Pt found unresponsive by roommate. Hx of Bone Ca. EMS arrival, no pulse. CPR resumed, 5 epi, 1 d50, 2 shocks and 4 narcan PTA. Pt with no cardiac activity on arrival per Dr. Wilson Singer on ultrasound. Pt time of death 0907 on arrival to ED.

## 2013-05-10 DEATH — deceased

## 2014-06-11 IMAGING — CR DG LUMBAR SPINE COMPLETE 4+V
5 series · 5 of 5 positions shown · non-contrast
Comparison: Whole body bone scan 05/23/2010.  Abdominal CT
05/23/2010.

CLINICAL DATA: Right thigh numbness with low back pain.  Prostate
cancer.

LUMBAR SPINE - COMPLETE 4+ VIEW

[t l-spine a.p.]
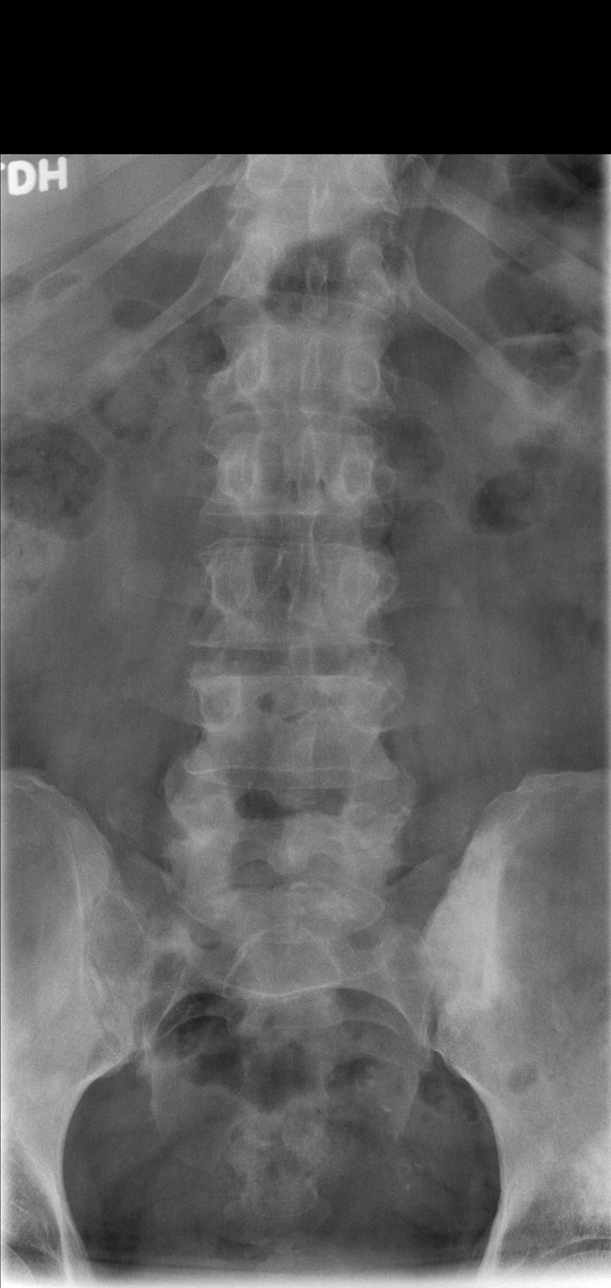

[t l-spine oblique exposure (1 of 2)]
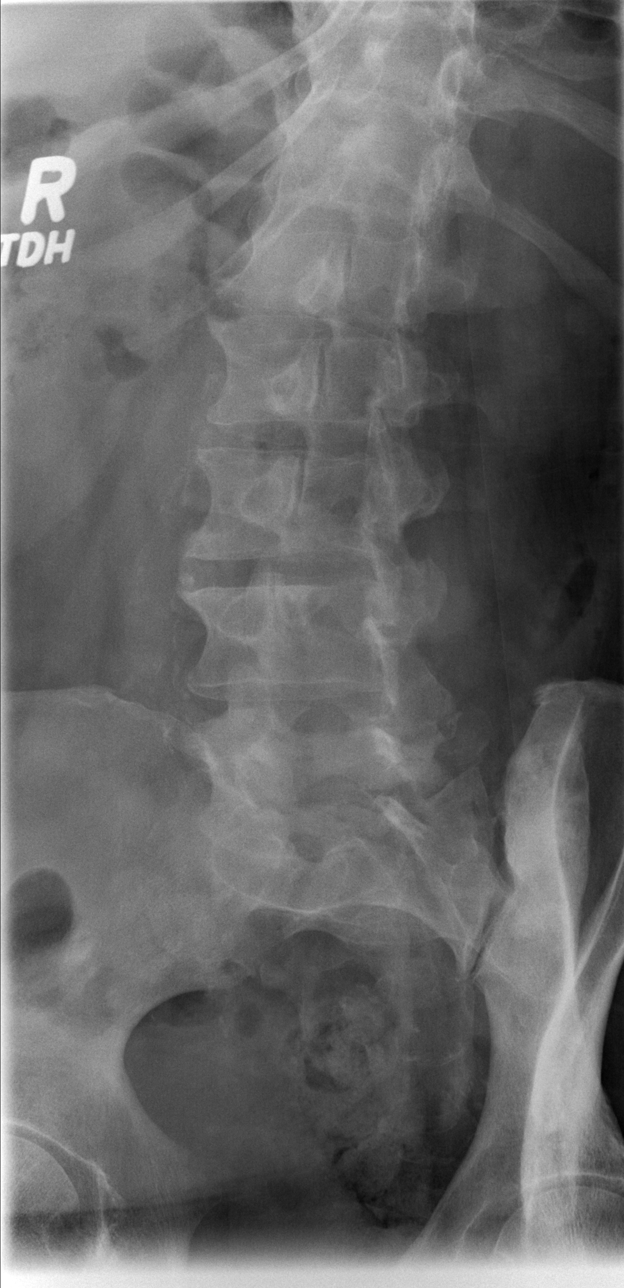

[t l-spine oblique exposure (2 of 2)]
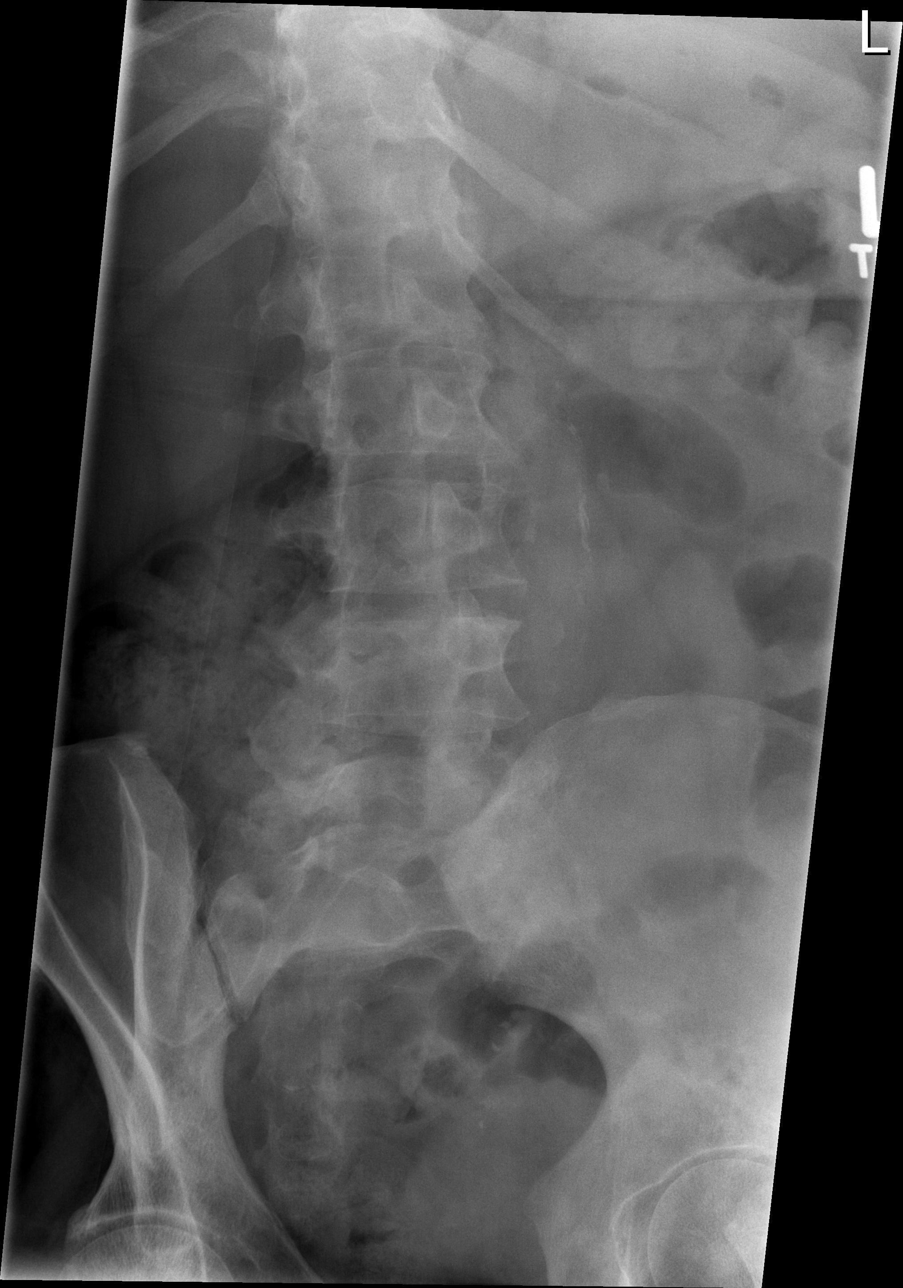

[t l-spine lat]
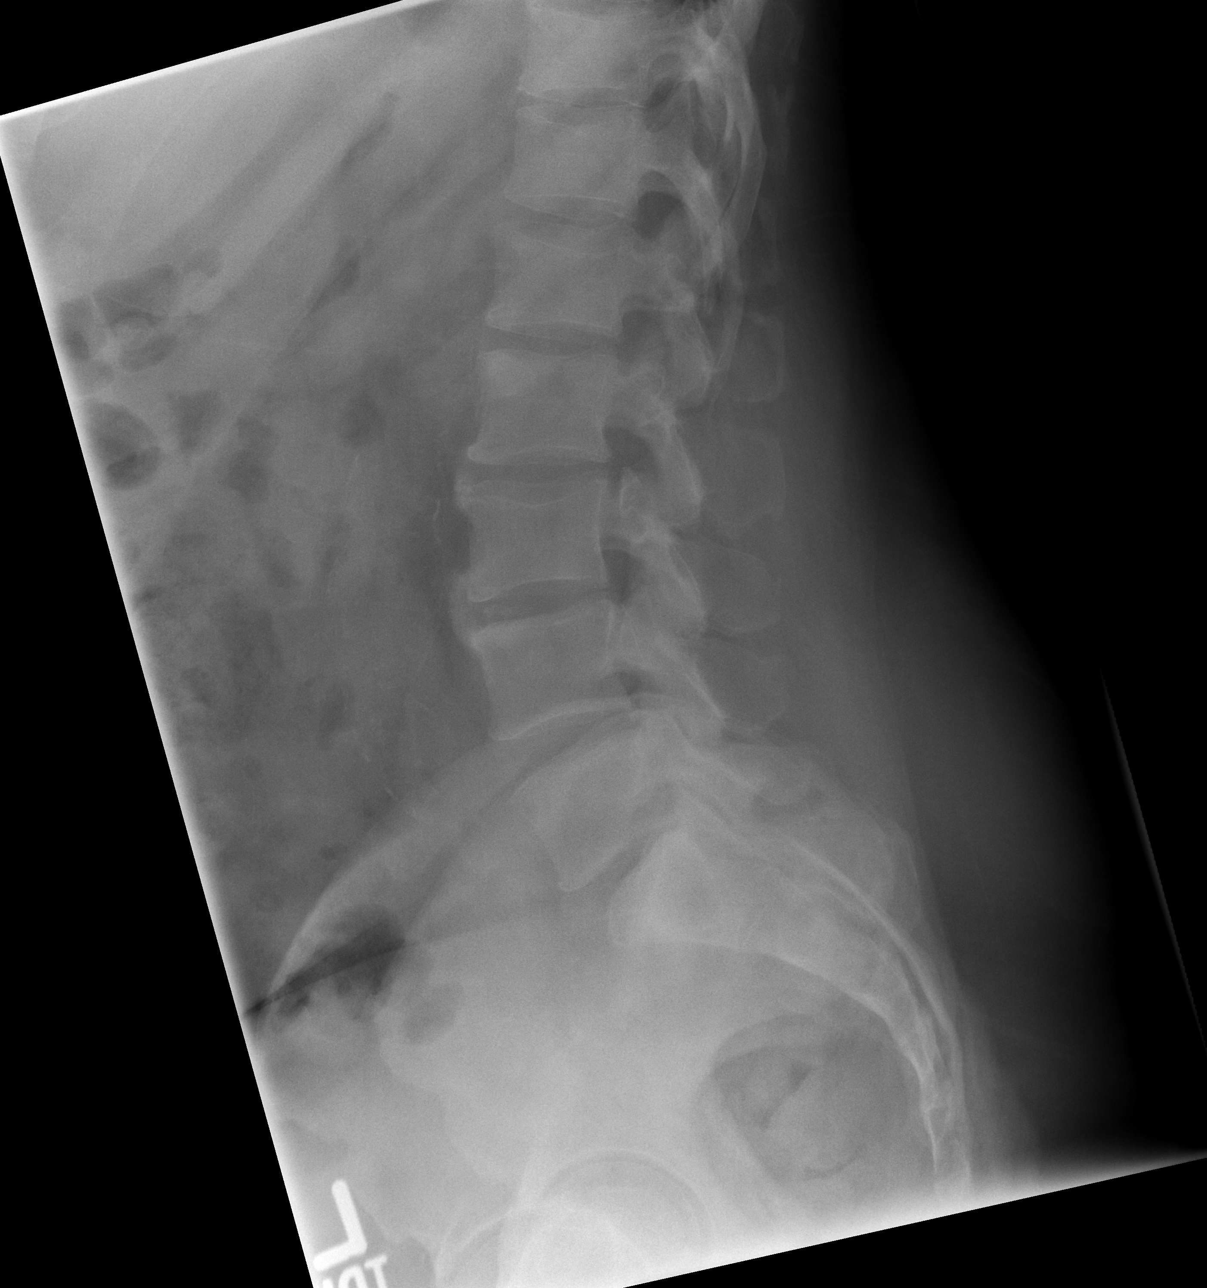

[t l-spine l5-s1 spot]
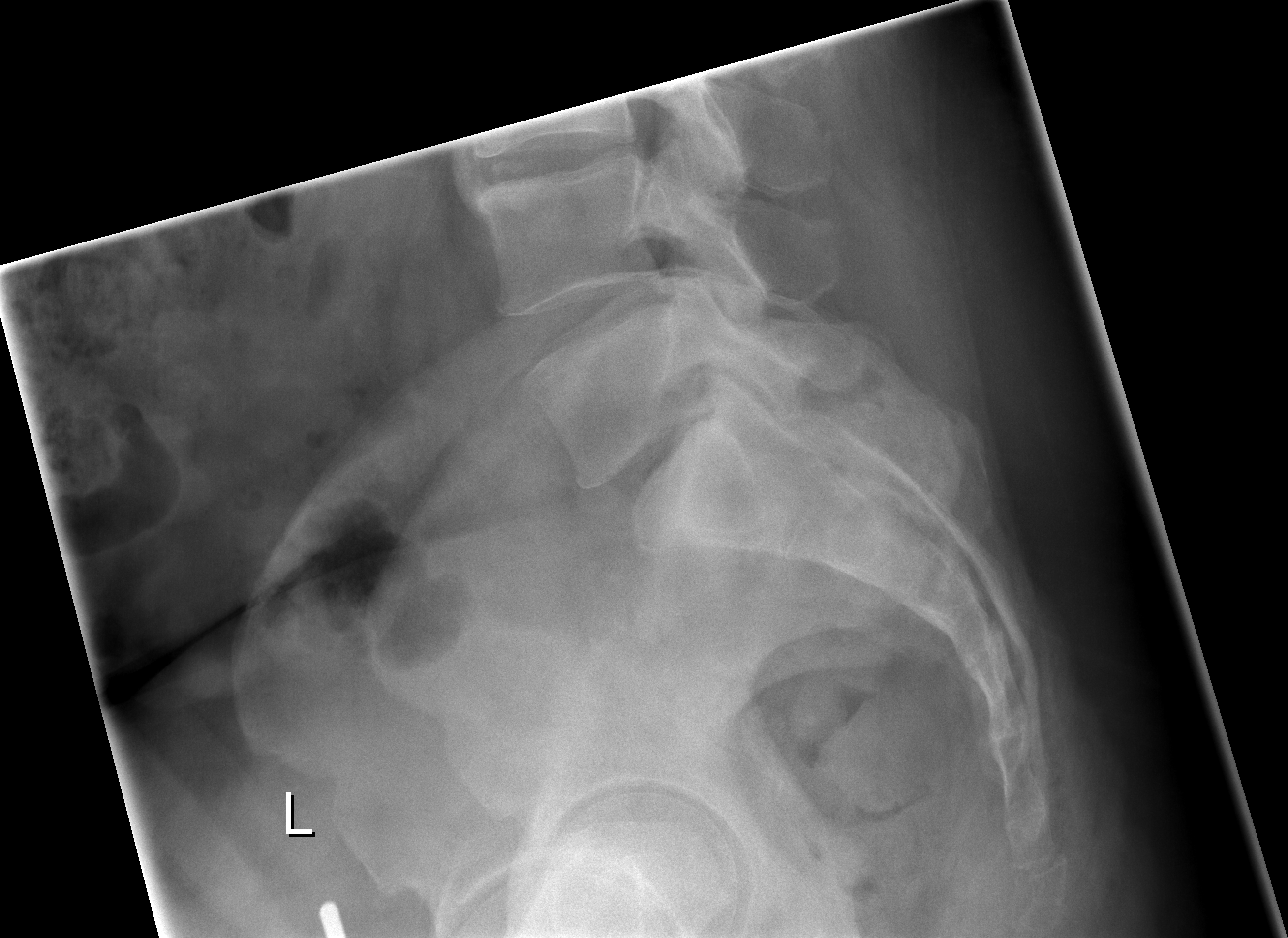

[5 of 5 positions shown; findings below may reference images not displayed]

FINDINGS: There are five lumbar type vertebral bodies.  The
alignment is normal.  There are diffuse blastic metastases
throughout the lumbar spine and pelvis.  No pathologic fracture is
identified.  There is no evidence of pars defect.  Facet
degenerative changes are present inferiorly.
IMPRESSION: Diffuse osseous metastatic disease appears unchanged from available
prior studies.  No acute fracture or malalignment identified.
